# Patient Record
Sex: Female | Born: 1945
Health system: Southern US, Community
[De-identification: ages and names within clinical notes are randomized; demographics above are authoritative.]

## PROBLEM LIST (undated history)

## (undated) DIAGNOSIS — M1990 Unspecified osteoarthritis, unspecified site: Secondary | ICD-10-CM

## (undated) DIAGNOSIS — Q211 Atrial septal defect, unspecified: Secondary | ICD-10-CM

## (undated) DIAGNOSIS — T8859XA Other complications of anesthesia, initial encounter: Secondary | ICD-10-CM

## (undated) DIAGNOSIS — E039 Hypothyroidism, unspecified: Secondary | ICD-10-CM

## (undated) DIAGNOSIS — T4145XA Adverse effect of unspecified anesthetic, initial encounter: Secondary | ICD-10-CM

## (undated) DIAGNOSIS — C50919 Malignant neoplasm of unspecified site of unspecified female breast: Secondary | ICD-10-CM

## (undated) DIAGNOSIS — Z853 Personal history of malignant neoplasm of breast: Secondary | ICD-10-CM

## (undated) HISTORY — DX: Atrial septal defect, unspecified: Q21.10

## (undated) HISTORY — PX: ABDOMINAL HYSTERECTOMY: SHX81

## (undated) HISTORY — DX: Malignant neoplasm of unspecified site of unspecified female breast: C50.919

## (undated) HISTORY — DX: Atrial septal defect: Q21.1

## (undated) HISTORY — PX: MASTECTOMY: SHX3

## (undated) HISTORY — DX: Hypothyroidism, unspecified: E03.9

---
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
## 2000-03-12 ENCOUNTER — Ambulatory Visit: Admission: RE | Admit: 2000-03-12 | Discharge: 2000-03-12 | Payer: Self-pay | Admitting: Gynecology

## 2000-04-16 ENCOUNTER — Ambulatory Visit: Admission: RE | Admit: 2000-04-16 | Discharge: 2000-04-16 | Payer: Self-pay | Admitting: Gynecology

## 2015-02-08 DIAGNOSIS — N941 Unspecified dyspareunia: Secondary | ICD-10-CM | POA: Diagnosis not present

## 2015-02-08 DIAGNOSIS — N952 Postmenopausal atrophic vaginitis: Secondary | ICD-10-CM | POA: Diagnosis not present

## 2015-02-14 DIAGNOSIS — J189 Pneumonia, unspecified organism: Secondary | ICD-10-CM | POA: Diagnosis not present

## 2015-02-14 DIAGNOSIS — J9801 Acute bronchospasm: Secondary | ICD-10-CM | POA: Diagnosis not present

## 2015-02-14 DIAGNOSIS — Z6822 Body mass index (BMI) 22.0-22.9, adult: Secondary | ICD-10-CM | POA: Diagnosis not present

## 2015-03-23 DIAGNOSIS — Z6822 Body mass index (BMI) 22.0-22.9, adult: Secondary | ICD-10-CM | POA: Diagnosis not present

## 2015-03-23 DIAGNOSIS — M25569 Pain in unspecified knee: Secondary | ICD-10-CM | POA: Diagnosis not present

## 2015-09-18 DIAGNOSIS — G629 Polyneuropathy, unspecified: Secondary | ICD-10-CM | POA: Diagnosis not present

## 2015-09-18 DIAGNOSIS — K5900 Constipation, unspecified: Secondary | ICD-10-CM | POA: Diagnosis not present

## 2015-09-18 DIAGNOSIS — M545 Low back pain: Secondary | ICD-10-CM | POA: Diagnosis not present

## 2015-09-18 DIAGNOSIS — E038 Other specified hypothyroidism: Secondary | ICD-10-CM | POA: Diagnosis not present

## 2015-09-18 DIAGNOSIS — M5126 Other intervertebral disc displacement, lumbar region: Secondary | ICD-10-CM | POA: Diagnosis not present

## 2015-12-12 DIAGNOSIS — R252 Cramp and spasm: Secondary | ICD-10-CM | POA: Diagnosis not present

## 2015-12-12 DIAGNOSIS — Z1231 Encounter for screening mammogram for malignant neoplasm of breast: Secondary | ICD-10-CM | POA: Diagnosis not present

## 2015-12-12 DIAGNOSIS — J301 Allergic rhinitis due to pollen: Secondary | ICD-10-CM | POA: Diagnosis not present

## 2015-12-12 DIAGNOSIS — Z6822 Body mass index (BMI) 22.0-22.9, adult: Secondary | ICD-10-CM | POA: Diagnosis not present

## 2015-12-12 DIAGNOSIS — Z23 Encounter for immunization: Secondary | ICD-10-CM | POA: Diagnosis not present

## 2015-12-12 DIAGNOSIS — Z9181 History of falling: Secondary | ICD-10-CM | POA: Diagnosis not present

## 2015-12-12 DIAGNOSIS — E038 Other specified hypothyroidism: Secondary | ICD-10-CM | POA: Diagnosis not present

## 2015-12-12 DIAGNOSIS — Z1389 Encounter for screening for other disorder: Secondary | ICD-10-CM | POA: Diagnosis not present

## 2015-12-12 DIAGNOSIS — E785 Hyperlipidemia, unspecified: Secondary | ICD-10-CM | POA: Diagnosis not present

## 2015-12-12 DIAGNOSIS — Z79899 Other long term (current) drug therapy: Secondary | ICD-10-CM | POA: Diagnosis not present

## 2015-12-12 DIAGNOSIS — M818 Other osteoporosis without current pathological fracture: Secondary | ICD-10-CM | POA: Diagnosis not present

## 2015-12-12 DIAGNOSIS — K5900 Constipation, unspecified: Secondary | ICD-10-CM | POA: Diagnosis not present

## 2015-12-12 DIAGNOSIS — Z0000 Encounter for general adult medical examination without abnormal findings: Secondary | ICD-10-CM | POA: Diagnosis not present

## 2015-12-12 DIAGNOSIS — M545 Low back pain: Secondary | ICD-10-CM | POA: Diagnosis not present

## 2016-01-05 DIAGNOSIS — Z1231 Encounter for screening mammogram for malignant neoplasm of breast: Secondary | ICD-10-CM | POA: Diagnosis not present

## 2016-02-02 DIAGNOSIS — Z6822 Body mass index (BMI) 22.0-22.9, adult: Secondary | ICD-10-CM | POA: Diagnosis not present

## 2016-02-02 DIAGNOSIS — R238 Other skin changes: Secondary | ICD-10-CM | POA: Diagnosis not present

## 2016-02-02 DIAGNOSIS — N764 Abscess of vulva: Secondary | ICD-10-CM | POA: Diagnosis not present

## 2016-02-02 DIAGNOSIS — R300 Dysuria: Secondary | ICD-10-CM | POA: Diagnosis not present

## 2016-04-24 DIAGNOSIS — M5126 Other intervertebral disc displacement, lumbar region: Secondary | ICD-10-CM | POA: Diagnosis not present

## 2016-04-24 DIAGNOSIS — K5900 Constipation, unspecified: Secondary | ICD-10-CM | POA: Diagnosis not present

## 2016-04-24 DIAGNOSIS — Z6822 Body mass index (BMI) 22.0-22.9, adult: Secondary | ICD-10-CM | POA: Diagnosis not present

## 2016-07-05 DIAGNOSIS — H5203 Hypermetropia, bilateral: Secondary | ICD-10-CM | POA: Diagnosis not present

## 2016-07-05 DIAGNOSIS — H52223 Regular astigmatism, bilateral: Secondary | ICD-10-CM | POA: Diagnosis not present

## 2016-07-05 DIAGNOSIS — H524 Presbyopia: Secondary | ICD-10-CM | POA: Diagnosis not present

## 2016-09-13 DIAGNOSIS — M5431 Sciatica, right side: Secondary | ICD-10-CM | POA: Diagnosis not present

## 2016-09-13 DIAGNOSIS — S39012A Strain of muscle, fascia and tendon of lower back, initial encounter: Secondary | ICD-10-CM | POA: Diagnosis not present

## 2016-09-14 DIAGNOSIS — M5416 Radiculopathy, lumbar region: Secondary | ICD-10-CM | POA: Diagnosis not present

## 2016-09-14 DIAGNOSIS — M5430 Sciatica, unspecified side: Secondary | ICD-10-CM | POA: Diagnosis not present

## 2016-09-14 DIAGNOSIS — M5116 Intervertebral disc disorders with radiculopathy, lumbar region: Secondary | ICD-10-CM | POA: Diagnosis not present

## 2016-09-14 DIAGNOSIS — M5431 Sciatica, right side: Secondary | ICD-10-CM | POA: Diagnosis not present

## 2016-09-19 DIAGNOSIS — E063 Autoimmune thyroiditis: Secondary | ICD-10-CM | POA: Diagnosis not present

## 2016-09-19 DIAGNOSIS — K5900 Constipation, unspecified: Secondary | ICD-10-CM | POA: Diagnosis not present

## 2016-09-19 DIAGNOSIS — N390 Urinary tract infection, site not specified: Secondary | ICD-10-CM | POA: Diagnosis not present

## 2016-09-19 DIAGNOSIS — Z6822 Body mass index (BMI) 22.0-22.9, adult: Secondary | ICD-10-CM | POA: Diagnosis not present

## 2016-09-19 DIAGNOSIS — M545 Low back pain: Secondary | ICD-10-CM | POA: Diagnosis not present

## 2016-12-17 DIAGNOSIS — Z6821 Body mass index (BMI) 21.0-21.9, adult: Secondary | ICD-10-CM | POA: Diagnosis not present

## 2016-12-17 DIAGNOSIS — Z23 Encounter for immunization: Secondary | ICD-10-CM | POA: Diagnosis not present

## 2016-12-17 DIAGNOSIS — Z0000 Encounter for general adult medical examination without abnormal findings: Secondary | ICD-10-CM | POA: Diagnosis not present

## 2016-12-17 DIAGNOSIS — M5126 Other intervertebral disc displacement, lumbar region: Secondary | ICD-10-CM | POA: Diagnosis not present

## 2016-12-17 DIAGNOSIS — E785 Hyperlipidemia, unspecified: Secondary | ICD-10-CM | POA: Diagnosis not present

## 2016-12-17 DIAGNOSIS — I872 Venous insufficiency (chronic) (peripheral): Secondary | ICD-10-CM | POA: Diagnosis not present

## 2016-12-17 DIAGNOSIS — M7989 Other specified soft tissue disorders: Secondary | ICD-10-CM | POA: Diagnosis not present

## 2016-12-17 DIAGNOSIS — E063 Autoimmune thyroiditis: Secondary | ICD-10-CM | POA: Diagnosis not present

## 2016-12-17 DIAGNOSIS — J301 Allergic rhinitis due to pollen: Secondary | ICD-10-CM | POA: Diagnosis not present

## 2016-12-17 DIAGNOSIS — M25569 Pain in unspecified knee: Secondary | ICD-10-CM | POA: Diagnosis not present

## 2016-12-17 DIAGNOSIS — M818 Other osteoporosis without current pathological fracture: Secondary | ICD-10-CM | POA: Diagnosis not present

## 2016-12-17 DIAGNOSIS — Z1231 Encounter for screening mammogram for malignant neoplasm of breast: Secondary | ICD-10-CM | POA: Diagnosis not present

## 2016-12-17 DIAGNOSIS — Z79899 Other long term (current) drug therapy: Secondary | ICD-10-CM | POA: Diagnosis not present

## 2016-12-17 DIAGNOSIS — M545 Low back pain: Secondary | ICD-10-CM | POA: Diagnosis not present

## 2016-12-19 DIAGNOSIS — M79661 Pain in right lower leg: Secondary | ICD-10-CM | POA: Diagnosis not present

## 2016-12-19 DIAGNOSIS — M79662 Pain in left lower leg: Secondary | ICD-10-CM | POA: Diagnosis not present

## 2016-12-19 DIAGNOSIS — R7989 Other specified abnormal findings of blood chemistry: Secondary | ICD-10-CM | POA: Diagnosis not present

## 2016-12-19 DIAGNOSIS — M7989 Other specified soft tissue disorders: Secondary | ICD-10-CM | POA: Diagnosis not present

## 2016-12-25 DIAGNOSIS — Z6821 Body mass index (BMI) 21.0-21.9, adult: Secondary | ICD-10-CM | POA: Diagnosis not present

## 2016-12-25 DIAGNOSIS — N390 Urinary tract infection, site not specified: Secondary | ICD-10-CM | POA: Diagnosis not present

## 2016-12-25 DIAGNOSIS — M545 Low back pain: Secondary | ICD-10-CM | POA: Diagnosis not present

## 2017-01-07 DIAGNOSIS — Z1231 Encounter for screening mammogram for malignant neoplasm of breast: Secondary | ICD-10-CM | POA: Diagnosis not present

## 2017-05-16 DIAGNOSIS — E063 Autoimmune thyroiditis: Secondary | ICD-10-CM | POA: Diagnosis not present

## 2017-05-16 DIAGNOSIS — M545 Low back pain: Secondary | ICD-10-CM | POA: Diagnosis not present

## 2017-05-16 DIAGNOSIS — Z6821 Body mass index (BMI) 21.0-21.9, adult: Secondary | ICD-10-CM | POA: Diagnosis not present

## 2017-06-05 DIAGNOSIS — M5430 Sciatica, unspecified side: Secondary | ICD-10-CM | POA: Diagnosis not present

## 2017-09-30 DIAGNOSIS — E063 Autoimmune thyroiditis: Secondary | ICD-10-CM | POA: Diagnosis not present

## 2017-09-30 DIAGNOSIS — E785 Hyperlipidemia, unspecified: Secondary | ICD-10-CM | POA: Diagnosis not present

## 2017-09-30 DIAGNOSIS — K5900 Constipation, unspecified: Secondary | ICD-10-CM | POA: Diagnosis not present

## 2017-09-30 DIAGNOSIS — Z6821 Body mass index (BMI) 21.0-21.9, adult: Secondary | ICD-10-CM | POA: Diagnosis not present

## 2017-09-30 DIAGNOSIS — M5431 Sciatica, right side: Secondary | ICD-10-CM | POA: Diagnosis not present

## 2017-11-10 DIAGNOSIS — Z6821 Body mass index (BMI) 21.0-21.9, adult: Secondary | ICD-10-CM | POA: Diagnosis not present

## 2017-11-10 DIAGNOSIS — M62830 Muscle spasm of back: Secondary | ICD-10-CM | POA: Diagnosis not present

## 2017-11-10 DIAGNOSIS — M545 Low back pain: Secondary | ICD-10-CM | POA: Diagnosis not present

## 2017-11-10 DIAGNOSIS — K5900 Constipation, unspecified: Secondary | ICD-10-CM | POA: Diagnosis not present

## 2018-01-20 DIAGNOSIS — E063 Autoimmune thyroiditis: Secondary | ICD-10-CM | POA: Diagnosis not present

## 2018-01-20 DIAGNOSIS — G2581 Restless legs syndrome: Secondary | ICD-10-CM | POA: Diagnosis not present

## 2018-01-20 DIAGNOSIS — Z1231 Encounter for screening mammogram for malignant neoplasm of breast: Secondary | ICD-10-CM | POA: Diagnosis not present

## 2018-01-20 DIAGNOSIS — Z6822 Body mass index (BMI) 22.0-22.9, adult: Secondary | ICD-10-CM | POA: Diagnosis not present

## 2018-01-20 DIAGNOSIS — J301 Allergic rhinitis due to pollen: Secondary | ICD-10-CM | POA: Diagnosis not present

## 2018-01-20 DIAGNOSIS — Z23 Encounter for immunization: Secondary | ICD-10-CM | POA: Diagnosis not present

## 2018-01-20 DIAGNOSIS — Z79899 Other long term (current) drug therapy: Secondary | ICD-10-CM | POA: Diagnosis not present

## 2018-01-20 DIAGNOSIS — Z0000 Encounter for general adult medical examination without abnormal findings: Secondary | ICD-10-CM | POA: Diagnosis not present

## 2018-01-20 DIAGNOSIS — E785 Hyperlipidemia, unspecified: Secondary | ICD-10-CM | POA: Diagnosis not present

## 2018-01-20 DIAGNOSIS — M545 Low back pain: Secondary | ICD-10-CM | POA: Diagnosis not present

## 2018-01-26 DIAGNOSIS — M160 Bilateral primary osteoarthritis of hip: Secondary | ICD-10-CM | POA: Diagnosis not present

## 2018-01-26 DIAGNOSIS — M5442 Lumbago with sciatica, left side: Secondary | ICD-10-CM | POA: Diagnosis not present

## 2018-01-26 DIAGNOSIS — Z1231 Encounter for screening mammogram for malignant neoplasm of breast: Secondary | ICD-10-CM | POA: Diagnosis not present

## 2018-01-26 DIAGNOSIS — M545 Low back pain: Secondary | ICD-10-CM | POA: Diagnosis not present

## 2018-01-26 DIAGNOSIS — M5441 Lumbago with sciatica, right side: Secondary | ICD-10-CM | POA: Diagnosis not present

## 2018-01-28 DIAGNOSIS — M1610 Unilateral primary osteoarthritis, unspecified hip: Secondary | ICD-10-CM | POA: Diagnosis not present

## 2018-01-28 DIAGNOSIS — M160 Bilateral primary osteoarthritis of hip: Secondary | ICD-10-CM | POA: Diagnosis not present

## 2018-02-06 DIAGNOSIS — M1611 Unilateral primary osteoarthritis, right hip: Secondary | ICD-10-CM | POA: Diagnosis not present

## 2018-03-11 DIAGNOSIS — M1611 Unilateral primary osteoarthritis, right hip: Secondary | ICD-10-CM | POA: Diagnosis not present

## 2018-06-19 DIAGNOSIS — E785 Hyperlipidemia, unspecified: Secondary | ICD-10-CM | POA: Diagnosis not present

## 2018-06-19 DIAGNOSIS — M7989 Other specified soft tissue disorders: Secondary | ICD-10-CM | POA: Diagnosis not present

## 2018-06-19 DIAGNOSIS — Z6822 Body mass index (BMI) 22.0-22.9, adult: Secondary | ICD-10-CM | POA: Diagnosis not present

## 2018-06-19 DIAGNOSIS — E063 Autoimmune thyroiditis: Secondary | ICD-10-CM | POA: Diagnosis not present

## 2018-06-22 DIAGNOSIS — R7989 Other specified abnormal findings of blood chemistry: Secondary | ICD-10-CM | POA: Diagnosis not present

## 2018-06-22 DIAGNOSIS — R2242 Localized swelling, mass and lump, left lower limb: Secondary | ICD-10-CM | POA: Diagnosis not present

## 2018-06-22 DIAGNOSIS — R2241 Localized swelling, mass and lump, right lower limb: Secondary | ICD-10-CM | POA: Diagnosis not present

## 2018-06-22 DIAGNOSIS — M7989 Other specified soft tissue disorders: Secondary | ICD-10-CM | POA: Diagnosis not present

## 2018-07-28 DIAGNOSIS — L03116 Cellulitis of left lower limb: Secondary | ICD-10-CM | POA: Diagnosis not present

## 2018-07-28 DIAGNOSIS — L03115 Cellulitis of right lower limb: Secondary | ICD-10-CM | POA: Diagnosis not present

## 2018-07-28 DIAGNOSIS — Z6822 Body mass index (BMI) 22.0-22.9, adult: Secondary | ICD-10-CM | POA: Diagnosis not present

## 2018-07-28 DIAGNOSIS — I872 Venous insufficiency (chronic) (peripheral): Secondary | ICD-10-CM | POA: Diagnosis not present

## 2018-08-20 DIAGNOSIS — Z6821 Body mass index (BMI) 21.0-21.9, adult: Secondary | ICD-10-CM | POA: Diagnosis not present

## 2018-08-20 DIAGNOSIS — R262 Difficulty in walking, not elsewhere classified: Secondary | ICD-10-CM | POA: Diagnosis not present

## 2018-08-20 DIAGNOSIS — M25551 Pain in right hip: Secondary | ICD-10-CM | POA: Diagnosis not present

## 2018-08-20 DIAGNOSIS — M25552 Pain in left hip: Secondary | ICD-10-CM | POA: Diagnosis not present

## 2018-08-20 DIAGNOSIS — M5416 Radiculopathy, lumbar region: Secondary | ICD-10-CM | POA: Diagnosis not present

## 2018-11-02 DIAGNOSIS — M62830 Muscle spasm of back: Secondary | ICD-10-CM | POA: Diagnosis not present

## 2018-11-02 DIAGNOSIS — K5900 Constipation, unspecified: Secondary | ICD-10-CM | POA: Diagnosis not present

## 2018-11-02 DIAGNOSIS — M545 Low back pain: Secondary | ICD-10-CM | POA: Diagnosis not present

## 2018-11-02 DIAGNOSIS — N390 Urinary tract infection, site not specified: Secondary | ICD-10-CM | POA: Diagnosis not present

## 2018-11-02 DIAGNOSIS — R300 Dysuria: Secondary | ICD-10-CM | POA: Diagnosis not present

## 2018-11-02 DIAGNOSIS — M5432 Sciatica, left side: Secondary | ICD-10-CM | POA: Diagnosis not present

## 2018-11-02 DIAGNOSIS — Z6822 Body mass index (BMI) 22.0-22.9, adult: Secondary | ICD-10-CM | POA: Diagnosis not present

## 2018-11-05 DIAGNOSIS — M48061 Spinal stenosis, lumbar region without neurogenic claudication: Secondary | ICD-10-CM | POA: Diagnosis not present

## 2018-11-05 DIAGNOSIS — M545 Low back pain: Secondary | ICD-10-CM | POA: Diagnosis not present

## 2018-11-18 DIAGNOSIS — Z23 Encounter for immunization: Secondary | ICD-10-CM | POA: Diagnosis not present

## 2018-11-25 DIAGNOSIS — R350 Frequency of micturition: Secondary | ICD-10-CM | POA: Diagnosis not present

## 2018-11-25 DIAGNOSIS — M62830 Muscle spasm of back: Secondary | ICD-10-CM | POA: Diagnosis not present

## 2018-11-25 DIAGNOSIS — E063 Autoimmune thyroiditis: Secondary | ICD-10-CM | POA: Diagnosis not present

## 2018-11-25 DIAGNOSIS — E785 Hyperlipidemia, unspecified: Secondary | ICD-10-CM | POA: Diagnosis not present

## 2018-11-25 DIAGNOSIS — G2581 Restless legs syndrome: Secondary | ICD-10-CM | POA: Diagnosis not present

## 2018-11-25 DIAGNOSIS — Z1331 Encounter for screening for depression: Secondary | ICD-10-CM | POA: Diagnosis not present

## 2018-11-25 DIAGNOSIS — Z6821 Body mass index (BMI) 21.0-21.9, adult: Secondary | ICD-10-CM | POA: Diagnosis not present

## 2018-11-25 DIAGNOSIS — E559 Vitamin D deficiency, unspecified: Secondary | ICD-10-CM | POA: Diagnosis not present

## 2018-11-25 DIAGNOSIS — Z9181 History of falling: Secondary | ICD-10-CM | POA: Diagnosis not present

## 2018-11-25 DIAGNOSIS — M545 Low back pain: Secondary | ICD-10-CM | POA: Diagnosis not present

## 2018-11-30 DIAGNOSIS — M5416 Radiculopathy, lumbar region: Secondary | ICD-10-CM | POA: Diagnosis not present

## 2018-11-30 DIAGNOSIS — M5136 Other intervertebral disc degeneration, lumbar region: Secondary | ICD-10-CM | POA: Diagnosis not present

## 2019-01-25 DIAGNOSIS — L03116 Cellulitis of left lower limb: Secondary | ICD-10-CM | POA: Diagnosis not present

## 2019-01-25 DIAGNOSIS — M7989 Other specified soft tissue disorders: Secondary | ICD-10-CM | POA: Diagnosis not present

## 2019-01-25 DIAGNOSIS — E559 Vitamin D deficiency, unspecified: Secondary | ICD-10-CM | POA: Diagnosis not present

## 2019-01-25 DIAGNOSIS — E063 Autoimmune thyroiditis: Secondary | ICD-10-CM | POA: Diagnosis not present

## 2019-01-25 DIAGNOSIS — L03115 Cellulitis of right lower limb: Secondary | ICD-10-CM | POA: Diagnosis not present

## 2019-01-25 DIAGNOSIS — Z6823 Body mass index (BMI) 23.0-23.9, adult: Secondary | ICD-10-CM | POA: Diagnosis not present

## 2019-01-25 DIAGNOSIS — I872 Venous insufficiency (chronic) (peripheral): Secondary | ICD-10-CM | POA: Diagnosis not present

## 2019-01-26 DIAGNOSIS — M7989 Other specified soft tissue disorders: Secondary | ICD-10-CM | POA: Diagnosis not present

## 2019-01-26 DIAGNOSIS — R600 Localized edema: Secondary | ICD-10-CM | POA: Diagnosis not present

## 2019-01-26 DIAGNOSIS — R7989 Other specified abnormal findings of blood chemistry: Secondary | ICD-10-CM | POA: Diagnosis not present

## 2019-02-13 DIAGNOSIS — R609 Edema, unspecified: Secondary | ICD-10-CM | POA: Diagnosis not present

## 2019-02-13 DIAGNOSIS — R600 Localized edema: Secondary | ICD-10-CM | POA: Diagnosis not present

## 2019-02-13 DIAGNOSIS — J9811 Atelectasis: Secondary | ICD-10-CM | POA: Diagnosis not present

## 2019-02-13 DIAGNOSIS — B9689 Other specified bacterial agents as the cause of diseases classified elsewhere: Secondary | ICD-10-CM | POA: Diagnosis not present

## 2019-02-13 DIAGNOSIS — N3000 Acute cystitis without hematuria: Secondary | ICD-10-CM | POA: Diagnosis not present

## 2019-02-25 DIAGNOSIS — E063 Autoimmune thyroiditis: Secondary | ICD-10-CM | POA: Diagnosis not present

## 2019-02-25 DIAGNOSIS — E559 Vitamin D deficiency, unspecified: Secondary | ICD-10-CM | POA: Diagnosis not present

## 2019-02-25 DIAGNOSIS — Z79899 Other long term (current) drug therapy: Secondary | ICD-10-CM | POA: Diagnosis not present

## 2019-02-25 DIAGNOSIS — Z6822 Body mass index (BMI) 22.0-22.9, adult: Secondary | ICD-10-CM | POA: Diagnosis not present

## 2019-02-25 DIAGNOSIS — J301 Allergic rhinitis due to pollen: Secondary | ICD-10-CM | POA: Diagnosis not present

## 2019-02-25 DIAGNOSIS — G2581 Restless legs syndrome: Secondary | ICD-10-CM | POA: Diagnosis not present

## 2019-02-25 DIAGNOSIS — R7989 Other specified abnormal findings of blood chemistry: Secondary | ICD-10-CM | POA: Diagnosis not present

## 2019-02-25 DIAGNOSIS — E785 Hyperlipidemia, unspecified: Secondary | ICD-10-CM | POA: Diagnosis not present

## 2019-02-25 DIAGNOSIS — M545 Low back pain: Secondary | ICD-10-CM | POA: Diagnosis not present

## 2019-02-25 DIAGNOSIS — K5900 Constipation, unspecified: Secondary | ICD-10-CM | POA: Diagnosis not present

## 2019-03-02 DIAGNOSIS — I370 Nonrheumatic pulmonary valve stenosis: Secondary | ICD-10-CM | POA: Diagnosis not present

## 2019-03-02 DIAGNOSIS — I361 Nonrheumatic tricuspid (valve) insufficiency: Secondary | ICD-10-CM | POA: Diagnosis not present

## 2019-03-02 DIAGNOSIS — I352 Nonrheumatic aortic (valve) stenosis with insufficiency: Secondary | ICD-10-CM | POA: Diagnosis not present

## 2019-03-02 DIAGNOSIS — R011 Cardiac murmur, unspecified: Secondary | ICD-10-CM | POA: Diagnosis not present

## 2019-03-15 ENCOUNTER — Other Ambulatory Visit: Payer: Self-pay

## 2019-03-15 ENCOUNTER — Ambulatory Visit (INDEPENDENT_AMBULATORY_CARE_PROVIDER_SITE_OTHER): Payer: PPO | Admitting: Cardiology

## 2019-03-15 ENCOUNTER — Encounter: Payer: Self-pay | Admitting: Cardiology

## 2019-03-15 VITALS — BP 124/78 | HR 89 | Ht 61.0 in | Wt 122.0 lb

## 2019-03-15 DIAGNOSIS — R600 Localized edema: Secondary | ICD-10-CM

## 2019-03-15 DIAGNOSIS — I350 Nonrheumatic aortic (valve) stenosis: Secondary | ICD-10-CM

## 2019-03-15 HISTORY — DX: Localized edema: R60.0

## 2019-03-15 HISTORY — DX: Nonrheumatic aortic (valve) stenosis: I35.0

## 2019-03-15 MED ORDER — POTASSIUM CHLORIDE CRYS ER 20 MEQ PO TBCR: EXTENDED_RELEASE_TABLET | ORAL | 1 refills | Status: DC

## 2019-03-15 MED ORDER — FUROSEMIDE 20 MG PO TABS: ORAL_TABLET | ORAL | 1 refills | Status: DC

## 2019-03-15 NOTE — Patient Instructions (Signed)
Medication Instructions:  Your physician has recommended you make the following change in your medication:   START: Furosemide 20 mg Take 1 tab on Tues,Thurs and Saturdays START: Potassium 20 meq Take 1 tab on Tues,Thurs and Saturdays  *If you need a refill on your cardiac medications before your next appointment, please call your pharmacy*  Lab Work: Your physician recommends that you return for lab work in: TODAY BMP,Magnesium If you have labs (blood work) drawn today and your tests are completely normal, you will receive your results only by: Marland Kitchen MyChart Message (if you have MyChart) OR . A paper copy in the mail If you have any lab test that is abnormal or we need to change your treatment, we will call you to review the results.  Testing/Procedures: None   Follow-Up: At [facility], you and your health needs are our priority.  As part of our continuing mission to provide you with exceptional heart care, we have created designated Provider Care Teams.  These Care Teams include your primary Cardiologist (physician) and Advanced Practice Providers (APPs -  Physician Assistants and Nurse Practitioners) who all work together to provide you with the care you need, when you need it.  Your next appointment:   1 month(s)  The format for your next appointment:   In Person  Provider:   Berniece Salines, DO  Other Instructions You are being referred to Dr Burt Knack for your aortic stenosis.They will contact you with an appointment date and time.

## 2019-03-15 NOTE — Progress Notes (Signed)
Cardiology Office Note:    Date:  03/15/2019   ID:  Denise Bailey, DOB [birth year], MRN [id number]  PCP:  Penelope Coop, FNP  Cardiologist:  Berniece Salines, DO  Electrophysiologist:  None   Referring MD: Penelope Coop, FNP   The patient was referred by her PCP due to aortic stenosis  History of Present Illness:    Denise Bailey is a 74 y.o. female with a hx of recently diagnosed severe aortic stenosis, hypothyroidism, breast cancer status post mastectomy.  Patient was referred by her primary care provider due to severe aortic stenosis.  Today she tells me that her activities are limited by her bilateral leg edema and leg pain.  She denies any chest pain, shortness of breath, lightheadedness or dizziness.    Past Medical History:  Diagnosis Date  . Breast cancer ([place])   . Hypothyroidism     Past Surgical History:  Procedure Laterality Date  . MASTECTOMY      Current Medications: Current Meds  Medication Sig  . hydrochlorothiazide (HYDRODIURIL) 25 MG tablet Take 25 mg by mouth daily.  Marland Kitchen levothyroxine (SYNTHROID) 88 MCG tablet Take 88 mcg by mouth daily.  . traMADol (ULTRAM) 50 MG tablet Take 100 mg by mouth 3 (three) times daily as needed.     Allergies:   Patient has no known allergies.   Social History   Socioeconomic History  . Marital status: Married    Spouse name: Not on file  . Number of children: Not on file  . Years of education: Not on file  . Highest education level: Not on file  Occupational History  . Not on file  Tobacco Use  . Smoking status: Never Smoker  Substance and Sexual Activity  . Alcohol use: Not on file  . Drug use: Not on file  . Sexual activity: Not on file  Other Topics Concern  . Not on file  Social History Narrative  . Not on file   Social Determinants of Health   Financial Resource Strain:   . Difficulty of Paying Living Expenses: Not on file  Food Insecurity:   . Worried About Charity fundraiser in the Last  Year: Not on file  . Ran Out of Food in the Last Year: Not on file  Transportation Needs:   . Lack of Transportation (Medical): Not on file  . Lack of Transportation (Non-Medical): Not on file  Physical Activity:   . Days of Exercise per Week: Not on file  . Minutes of Exercise per Session: Not on file  Stress:   . Feeling of Stress : Not on file  Social Connections:   . Frequency of Communication with Friends and Family: Not on file  . Frequency of Social Gatherings with Friends and Family: Not on file  . Attends Religious Services: Not on file  . Active Member of Clubs or Organizations: Not on file  . Attends Archivist Meetings: Not on file  . Marital Status: Not on file     Family History: The patient's family history is not on file.  ROS:   Review of Systems  Constitution: Negative for decreased appetite, fever and weight gain.  HENT: Negative for congestion, ear discharge, hoarse voice and sore throat.   Eyes: Negative for discharge, redness, vision loss in right eye and visual halos.  Cardiovascular: Negative for chest pain, dyspnea on exertion, leg swelling, orthopnea and palpitations.  Respiratory: Negative for cough, hemoptysis, shortness of breath and snoring.  Endocrine: Negative for heat intolerance and polyphagia.  Hematologic/Lymphatic: Negative for bleeding problem. Does not bruise/bleed easily.  Skin: Negative for flushing, nail changes, rash and suspicious lesions.  Musculoskeletal: Negative for arthritis, joint pain, muscle cramps, myalgias, neck pain and stiffness.  Gastrointestinal: Negative for abdominal pain, bowel incontinence, diarrhea and excessive appetite.  Genitourinary: Negative for decreased libido, genital sores and incomplete emptying.  Neurological: Negative for brief paralysis, focal weakness, headaches and loss of balance.  Psychiatric/Behavioral: Negative for altered mental status, depression and suicidal ideas.    Allergic/Immunologic: Negative for HIV exposure and persistent infections.    EKGs/Labs/Other Studies Reviewed:    The following studies were reviewed today:   EKG:  The ekg ordered today demonstrates normal sinus rhythm, heart rate 89 bpm.   Echocardiogram from [facility] showed normal left ventricle systolic function 60 to [id number].  Diastolic function with impaired relaxation.  Right ventricle was normal in size and function.  Left atrium is normal.  Right atrium is normal in size.  Interatrial septum intact.  Aortic valve with severe aortic stenosis (mean gradient 48, aortic valve area by VTI 0.26 cm, peak velocity 4.12 m/s).  Mild aortic regurgitation.  Mild to moderate tricuspid vegetation present.  There is trace mitral regurgitation.  RVSP 42 mmHg.  Mild pulmonic stenosis.  The aortic root, ascending aorta and aortic arch appears to be normal size.  Recent Labs: No results found for requested labs within last 8760 hours.  Recent Lipid Panel No results found for: CHOL, TRIG, HDL, CHOLHDL, VLDL, LDLCALC, LDLDIRECT  Physical Exam:    VS:  BP 124/78   Pulse 89   Ht 5\' 1"  (1.549 m)   Wt 122 lb (55.3 kg)   BMI 23.05 kg/m     Wt Readings from Last 3 Encounters:  03/15/19 122 lb (55.3 kg)     GEN: Well nourished, well developed in no acute distress HEENT: Normal NECK: No JVD; No carotid bruits LYMPHATICS: No lymphadenopathy CARDIAC: S1S2 noted,RRR, 2/6 mid-to-late ejection systolic murmurs, rubs, gallops RESPIRATORY:  Clear to auscultation without rales, wheezing or rhonchi  ABDOMEN: Soft, non-tender, non-distended, +bowel sounds, no guarding. EXTREMITIES: Bilateral +1 leg edema, No cyanosis, no clubbing MUSCULOSKELETAL:  No deformity  SKIN: Warm and dry NEUROLOGIC:  Alert and oriented x 3, non-focal PSYCHIATRIC:  Normal affect, good insight  ASSESSMENT:    1. Severe aortic stenosis   2. Bilateral leg edema    PLAN:    Physical exam does show evidence of severe  aortic stenosis with bilateral leg edema.  The patient denies any chest pain, lightheadedness, dizziness although I do feel that she is at limited activity therefore cannot elicit symptoms.  At this time I would like to refer the patient to our structural clinic to be evaluated for potential TAVR.  In addition with the bilateral leg edema I am going to add Lasix 20 mg Tuesday Thursday and Saturdays along with potassium supplement to her daily regimen.  Blood work will be performed today for BMP and mag.   The patient is in agreement with the above plan. The patient left the office in stable condition.  The patient will follow up in 1 month or sooner if needed.   Medication Adjustments/Labs and Tests Ordered: Current medicines are reviewed at length with the patient today.  Concerns regarding medicines are outlined above.  Orders Placed This Encounter  Procedures  . Basic Metabolic Panel (BMET)  . Magnesium  . Ambulatory referral to Structural Heart/Valve Clinic (only at  [facility])  . EKG 12-Lead   Meds ordered this encounter  Medications  . furosemide (LASIX) 20 MG tablet    Sig: Take 1 tab (20 mg) on Tues,Thurs, and Saturdays    Dispense:  45 tablet    Refill:  1  . potassium chloride SA (KLOR-CON M20) 20 MEQ tablet    Sig: Take 1 tab (20 meq) on Tues, Thurs, and Saturday    Dispense:  45 tablet    Refill:  1    There are no Patient Instructions on file for this visit.   Adopting a Healthy Lifestyle.  Know what a healthy weight is for you (roughly BMI <25) and aim to maintain this   Aim for 7+ servings of fruits and vegetables daily   65-80+ fluid ounces of water or unsweet tea for healthy kidneys   Limit to max 1 drink of alcohol per day; avoid smoking/tobacco   Limit animal fats in diet for cholesterol and heart health - choose grass fed whenever available   Avoid highly processed foods, and foods high in saturated/trans fats   Aim for low stress - take time to unwind  and care for your mental health   Aim for 150 min of moderate intensity exercise weekly for heart health, and weights twice weekly for bone health   Aim for 7-9 hours of sleep daily   When it comes to diets, agreement about the perfect plan isnt easy to find, even among the experts. Experts at the [employer] developed an idea known as the Healthy Eating Plate. Just imagine a plate divided into logical, healthy portions.   The emphasis is on diet quality:   Load up on vegetables and fruits - one-half of your plate: Aim for color and variety, and remember that potatoes dont count.   Go for whole grains - one-quarter of your plate: Whole wheat, barley, wheat berries, quinoa, oats, brown rice, and foods made with them. If you want pasta, go with whole wheat pasta.   Protein power - one-quarter of your plate: Fish, chicken, beans, and nuts are all healthy, versatile protein sources. Limit red meat.   The diet, however, does go beyond the plate, offering a few other suggestions.   Use healthy plant oils, such as olive, canola, soy, corn, sunflower and peanut. Check the labels, and avoid partially hydrogenated oil, which have unhealthy trans fats.   If youre thirsty, drink water. Coffee and tea are good in moderation, but skip sugary drinks and limit milk and dairy products to one or two daily servings.   The type of carbohydrate in the diet is more important than the amount. Some sources of carbohydrates, such as vegetables, fruits, whole grains, and beans-are healthier than others.   Finally, stay active  Signed, Berniece Salines, DO  03/15/2019 2:26 PM    [facility]

## 2019-03-16 LAB — BASIC METABOLIC PANEL
BUN/Creatinine Ratio: 27 (ref 12–28)
BUN: 20 mg/dL (ref 8–27)
CO2: 25 mmol/L (ref 20–29)
Calcium: 9.2 mg/dL (ref 8.7–10.3)
Chloride: 100 mmol/L (ref 96–106)
Creatinine, Ser: 0.73 mg/dL (ref 0.57–1.00)
GFR calc Af Amer: 94 mL/min/{1.73_m2} (ref >59)
GFR calc non Af Amer: 81 mL/min/{1.73_m2} (ref >59)
Glucose: 81 mg/dL (ref 65–99)
Potassium: 4.3 mmol/L (ref 3.5–5.2)
Sodium: 141 mmol/L (ref 134–144)

## 2019-03-16 LAB — MAGNESIUM: Magnesium: 2.1 mg/dL (ref 1.6–2.3)

## 2019-03-25 ENCOUNTER — Ambulatory Visit
Admission: RE | Admit: 2019-03-25 | Discharge: 2019-03-25 | Disposition: A | Discharge status: Home / Self Care | Payer: Self-pay | Source: Ambulatory Visit | Attending: Cardiovascular Disease | Admitting: Cardiovascular Disease

## 2019-03-25 ENCOUNTER — Other Ambulatory Visit: Payer: Self-pay

## 2019-03-25 DIAGNOSIS — I350 Nonrheumatic aortic (valve) stenosis: Secondary | ICD-10-CM

## 2019-03-29 ENCOUNTER — Ambulatory Visit (INDEPENDENT_AMBULATORY_CARE_PROVIDER_SITE_OTHER): Payer: PPO | Admitting: Cardiovascular Disease

## 2019-03-29 ENCOUNTER — Encounter: Payer: Self-pay | Admitting: Cardiovascular Disease

## 2019-03-29 ENCOUNTER — Other Ambulatory Visit: Payer: Self-pay

## 2019-03-29 VITALS — BP 134/74 | HR 88 | Ht 61.0 in | Wt 120.0 lb

## 2019-03-29 DIAGNOSIS — R600 Localized edema: Secondary | ICD-10-CM | POA: Diagnosis not present

## 2019-03-29 DIAGNOSIS — I350 Nonrheumatic aortic (valve) stenosis: Secondary | ICD-10-CM

## 2019-03-29 DIAGNOSIS — M79605 Pain in left leg: Secondary | ICD-10-CM

## 2019-03-29 DIAGNOSIS — M79604 Pain in right leg: Secondary | ICD-10-CM

## 2019-03-29 NOTE — Patient Instructions (Addendum)
Medication Instructions:  Your provider recommends that you continue on your current medications as directed. Please refer to the Current Medication list given to you today.    Labs: BNP  Follow-Up: You have been referred to VASCULAR SURGERY with an ultrasound of your legs before the visit.  We will call you to arrange your 6 month echo and office visit with Dr. Burt Knack.

## 2019-03-29 NOTE — Progress Notes (Signed)
[facility]   MULTIDISCIPLINARY HEART VALVE TEAM  Date:  03/30/2019   ID:  Denise Bailey, DOB Jun 07, 1945, MRN [id number]  PCP:  Penelope Coop, FNP   Chief Complaint  Patient presents with  . Leg Pain     HISTORY OF PRESENT ILLNESS: Denise Bailey is a 74 y.o. female who presents for evaluation of severe aortic stenosis, referred by Dr Harriet Masson.  The patient was recently referred to Dr. Harriet Masson after being diagnosed with severe aortic stenosis. She has never been told of a heart murmur prior to her recent diagnosis. She has no history of rheumatic fever. Her medical history is significant for remote breast cancer treated with mastectomy. She has no history of cardiac problems and specifically denies any history of CAD, heart attack, or arrhythmia. She denies chest pain, chest pressure, orthopnea, PND, lightheadedness, syncope, or heart palpitations. Her biggest complaint is leg swelling and pain. She has developed bilateral leg swelling over the past 3-4 months. She has leg pain with ambulation, low back and hip pain. She hasn't been able to get compression stockings on and even has a difficult time putting on regular socks and shoes.   The patient has had regular dental care every 6 months and reports no problems.  Past Medical History:  Diagnosis Date  . Breast cancer ([place])   . Hypothyroidism     Current Outpatient Medications  Medication Sig Dispense Refill  . furosemide (LASIX) 20 MG tablet Take 1 tab (20 mg) on Tues,Thurs, and Saturdays 45 tablet 1  . hydrochlorothiazide (HYDRODIURIL) 25 MG tablet Take 25 mg by mouth daily.    Marland Kitchen levothyroxine (SYNTHROID) 88 MCG tablet Take 88 mcg by mouth daily.    . potassium chloride SA (KLOR-CON M20) 20 MEQ tablet Take 1 tab (20 meq) on Tues, Thurs, and Saturday 45 tablet 1  . traMADol (ULTRAM) 50 MG tablet Take 100 mg by mouth 3 (three) times daily as needed.     No current facility-administered medications for this visit.      ALLERGIES:   Patient has no known allergies.   SOCIAL HISTORY:  The patient  reports that she has never smoked. She does not have any smokeless tobacco history on file.   FAMILY HISTORY:  The patient's family hx is negative for valvular heart disease  REVIEW OF SYSTEMS:  All other systems are reviewed and negative.   PHYSICAL EXAM: VS:  BP 134/74   Pulse 88   Ht 5\' 1"  (1.549 m)   Wt 120 lb (54.4 kg)   SpO2 97%   BMI 22.67 kg/m  , BMI Body mass index is 22.67 kg/m. GEN: Well nourished, well developed, in no acute distress HEENT: normal Neck: No JVD. carotids 2+ with bilateral bruits Cardiac: The heart is RRR with a grade 3/6 harsh mid peaking murmur with preserved A2.. 2+ bilateral pretibial edema with prominent superficial veins Respiratory:  clear to auscultation bilaterally GI: soft, nontender, nondistended, + BS MS: no deformity or atrophy Skin: warm and dry, no rash Neuro:  Strength and sensation are intact Psych: euthymic mood, full affect   RECENT LABS: 03/15/2019: BUN 20; Creatinine, Ser 0.73; Magnesium 2.1; Potassium 4.3; Sodium 141 03/29/2019: NT-Pro BNP 737  No results found for requested labs within last 8760 hours.   Estimated Creatinine Clearance: 45.8 mL/min (by C-G formula based on SCr of 0.73 mg/dL).   Wt Readings from Last 3 Encounters:  03/29/19 120 lb (54.4 kg)  03/15/19 122 lb (55.3  kg)     CARDIAC STUDIES:  Echo: images and report reviewed.   ASSESSMENT AND PLAN: 1.  74 yo woman with severe, stage C1 aortic stenosis.   I have reviewed the natural history of aortic stenosis with the patient and their family members who are present today. We have discussed the limitations of medical therapy and the poor prognosis associated with symptomatic aortic stenosis. We have reviewed potential treatment options, including palliative medical therapy, conventional surgical aortic valve replacement, and transcatheter aortic valve replacement. We discussed  treatment options in the context of the patient's specific comorbid medical conditions.    I have personally reviewed her echo images from [facility] which demonstrate normal LV systolic function, with moderate-severe calcification and restriction of the aortic valve leaflets. The aortic valve is difficult to visualize secondary to poor acoustic windows. Peak and mean gradients are 68 and 48 mmHg, respectively. The valve area calculates very small at 0.33 square cm but I don't think this is accurate as her LVOT is measured at 1.7 cm.   I spoke to the patient at length today about diagnostic/treatment approaches. She strongly desires continued observation/close follow-up. Her sister is ill and she does not expect her to live much longer. She does not want to begin testing that would be required to work her up for TAVR at this time. She understands that she will need to keep an eye out for symptoms of shortness of breath, chest pain, or lightheadedness. I will plan to see her back in 6 months with an echocardiogram and office visit at that time. We will also draw a BNP today to assess subclinical heart failure in the setting of her valvular heart disease.   Regarding the patient's leg edema, she will continue on furosemide at her current dose. I suspect she has significant venous insufficiency with prominent superficial leg veins bilaterally. Will refer her to vascular surgery for further evaluation. She is given information on leg elevation with the Lounge Doctor device, sodium restriction, and compression.   Deatra James 03/30/2019 6:56 PM     [address] [id number]  [phone] (office) [phone] (fax)

## 2019-03-30 LAB — PRO B NATRIURETIC PEPTIDE: NT-Pro BNP: 737 pg/mL — ABNORMAL HIGH (ref 0–301)

## 2019-04-12 ENCOUNTER — Encounter: Payer: Self-pay | Admitting: Cardiology

## 2019-04-12 ENCOUNTER — Ambulatory Visit (INDEPENDENT_AMBULATORY_CARE_PROVIDER_SITE_OTHER): Payer: PPO | Admitting: Cardiology

## 2019-04-12 ENCOUNTER — Other Ambulatory Visit: Payer: Self-pay

## 2019-04-12 VITALS — BP 120/70 | HR 81 | Ht 61.0 in | Wt 120.0 lb

## 2019-04-12 DIAGNOSIS — I350 Nonrheumatic aortic (valve) stenosis: Secondary | ICD-10-CM | POA: Diagnosis not present

## 2019-04-12 DIAGNOSIS — R600 Localized edema: Secondary | ICD-10-CM | POA: Diagnosis not present

## 2019-04-12 DIAGNOSIS — I872 Venous insufficiency (chronic) (peripheral): Secondary | ICD-10-CM

## 2019-04-12 HISTORY — DX: Venous insufficiency (chronic) (peripheral): I87.2

## 2019-04-12 NOTE — Patient Instructions (Signed)
Medication Instructions:  *If you need a refill on your cardiac medications before your next appointment, please call your pharmacy*  Lab Work: If you have labs (blood work) drawn today and your tests are completely normal, you will receive your results only by: Marland Kitchen MyChart Message (if you have MyChart) OR . A paper copy in the mail If you have any lab test that is abnormal or we need to change your treatment, we will call you to review the results.  Follow-Up: At [facility], you and your health needs are our priority.  As part of our continuing mission to provide you with exceptional heart care, we have created designated Provider Care Teams.  These Care Teams include your primary Cardiologist (physician) and Advanced Practice Providers (APPs -  Physician Assistants and Nurse Practitioners) who all work together to provide you with the care you need, when you need it.  We recommend signing up for the patient portal called "MyChart".  Sign up information is provided on this After Visit Summary.  MyChart is used to connect with patients for Virtual Visits (Telemedicine).  Patients are able to view lab/test results, encounter notes, upcoming appointments, etc.  Non-urgent messages can be sent to your provider as well.   To learn more about what you can do with MyChart, go to [email or web address].    Your next appointment:   4 month(s)  The format for your next appointment:   Either In Person or Virtual  Provider:    You will see Kardie Tobb, DO.

## 2019-04-12 NOTE — Progress Notes (Signed)
Cardiology Office Note:    Date:  04/12/2019   ID:  Denise Bailey, DOB [birth year], MRN [id number]  PCP:  Penelope Coop, FNP  Cardiologist:  Berniece Salines, DO  Electrophysiologist:  None   Referring MD: Penelope Coop, FNP   Follow visit  History of Present Illness:    Denise Bailey is a 74 y.o. female with a hx of severe aortic stenosis, hypothyroidism, cancer status post mastectomy.  Did see the patient initially on March 15, 2019 at that time an echocardiogram done at [facility] reported severe aortic stenosis. At the conclusion of the visit I referred the patient to our valve clinic for evaluation. She was also started on Lasix due to leg edema and concern for heart failure in the setting of her valvular heart disease.  In the interim she was able to be seen at the valve clinic. For now given the patient is asymptomatic and concern for likely error in LVOT measurement which may have affected the valve area - a repeat echo and follow up visit has been planned for 6 months.  She was also referred to vascular due to concern for venous insufficiency.   Today the patient is here for a follow up visit. She reports that she is still experiencing leg edema. She denies shortness of breath or chest pain.   Past Medical History:  Diagnosis Date  . Bilateral leg edema 03/15/2019  . Breast cancer ([place])   . Hypothyroidism   . Severe aortic stenosis 03/15/2019    Past Surgical History:  Procedure Laterality Date  . MASTECTOMY      Current Medications: Current Meds  Medication Sig  . furosemide (LASIX) 20 MG tablet Take 1 tab (20 mg) on Tues,Thurs, and Saturdays  . hydrochlorothiazide (HYDRODIURIL) 25 MG tablet Take 25 mg by mouth daily.  Marland Kitchen levothyroxine (SYNTHROID) 88 MCG tablet Take 88 mcg by mouth daily.  . potassium chloride SA (KLOR-CON M20) 20 MEQ tablet Take 1 tab (20 meq) on Tues, Thurs, and Saturday  . traMADol (ULTRAM) 50 MG tablet Take 100 mg by mouth 3  (three) times daily as needed.     Allergies:   Patient has no known allergies.   Social History   Socioeconomic History  . Marital status: Married    Spouse name: Not on file  . Number of children: Not on file  . Years of education: Not on file  . Highest education level: Not on file  Occupational History  . Not on file  Tobacco Use  . Smoking status: Never Smoker  . Smokeless tobacco: Never Used  Substance and Sexual Activity  . Alcohol use: Not on file  . Drug use: Not on file  . Sexual activity: Not on file  Other Topics Concern  . Not on file  Social History Narrative  . Not on file   Social Determinants of Health   Financial Resource Strain:   . Difficulty of Paying Living Expenses:   Food Insecurity:   . Worried About Charity fundraiser in the Last Year:   . Arboriculturist in the Last Year:   Transportation Needs:   . Film/video editor (Medical):   Marland Kitchen Lack of Transportation (Non-Medical):   Physical Activity:   . Days of Exercise per Week:   . Minutes of Exercise per Session:   Stress:   . Feeling of Stress :   Social Connections:   . Frequency of Communication with Friends and Family:   .  Frequency of Social Gatherings with Friends and Family:   . Attends Religious Services:   . Active Member of Clubs or Organizations:   . Attends Archivist Meetings:   Marland Kitchen Marital Status:      Family History: The patient's family history is not on file.  ROS:   Review of Systems  Constitution: Negative for decreased appetite, fever and weight gain.  HENT: Negative for congestion, ear discharge, hoarse voice and sore throat.   Eyes: Negative for discharge, redness, vision loss in right eye and visual halos.  Cardiovascular: Negative for chest pain, dyspnea on exertion, leg swelling, orthopnea and palpitations.  Respiratory: Negative for cough, hemoptysis, shortness of breath and snoring.   Endocrine: Negative for heat intolerance and polyphagia.    Hematologic/Lymphatic: Negative for bleeding problem. Does not bruise/bleed easily.  Skin: Negative for flushing, nail changes, rash and suspicious lesions.  Musculoskeletal: Negative for arthritis, joint pain, muscle cramps, myalgias, neck pain and stiffness.  Gastrointestinal: Negative for abdominal pain, bowel incontinence, diarrhea and excessive appetite.  Genitourinary: Negative for decreased libido, genital sores and incomplete emptying.  Neurological: Negative for brief paralysis, focal weakness, headaches and loss of balance.  Psychiatric/Behavioral: Negative for altered mental status, depression and suicidal ideas.  Allergic/Immunologic: Negative for HIV exposure and persistent infections.    EKGs/Labs/Other Studies Reviewed:    The following studies were reviewed today:   EKG:  None today   Echocardiogram from [facility] showed normal left ventricle systolic function 60 to 123456.  Diastolic function with impaired relaxation.  Right ventricle was normal in size and function.  Left atrium is normal.  Right atrium is normal in size.  Interatrial septum intact.  Aortic valve with severe aortic stenosis (mean gradient 48, aortic valve area by VTI 0.26 cm, peak velocity 4.12 m/s).  Mild aortic regurgitation.  Mild to moderate tricuspid vegetation present.  There is trace mitral regurgitation.  RVSP 42 mmHg.  Mild pulmonic stenosis.  The aortic root, ascending aorta and aortic arch appears to be normal size.   Recent Labs: 03/15/2019: BUN 20; Creatinine, Ser 0.73; Magnesium 2.1; Potassium 4.3; Sodium 141 03/29/2019: NT-Pro BNP 737  Recent Lipid Panel No results found for: CHOL, TRIG, HDL, CHOLHDL, VLDL, LDLCALC, LDLDIRECT  Physical Exam:    VS:  BP 120/70 (BP Location: Right Arm, Patient Position: Sitting, Cuff Size: Normal)   Pulse 81   Ht 5\' 1"  (1.549 m)   Wt 120 lb (54.4 kg)   SpO2 94%   BMI 22.67 kg/m     Wt Readings from Last 3 Encounters:  04/12/19 120 lb (54.4 kg)   03/29/19 120 lb (54.4 kg)  03/15/19 122 lb (55.3 kg)     GEN: Well nourished, well developed in no acute distress HEENT: Normal NECK: No JVD; No carotid bruits LYMPHATICS: No lymphadenopathy CARDIAC: S1S2 noted,RRR, no murmurs, rubs, gallops RESPIRATORY:  Clear to auscultation without rales, wheezing or rhonchi  ABDOMEN: Soft, non-tender, non-distended, +bowel sounds, no guarding. EXTREMITIES: No edema, No cyanosis, no clubbing MUSCULOSKELETAL:  No deformity  SKIN: Warm and dry NEUROLOGIC:  Alert and oriented x 3, non-focal PSYCHIATRIC:  Normal affect, good insight  ASSESSMENT:    1. Severe aortic stenosis   2. Bilateral leg edema   3. Venous insufficiency    PLAN:     Still asymptomatic - plan to repeat TTE at her next echo. She plans to see the valve clinic (Dr. Burt Knack in 6 months). Her BNP was elevated suggestion subclinical heart failure. I  will continue the patient on the current dose of Lasix.   Her bilateral leg edema may be multifactorial and venous insufficiency may be playing a role. Continue Lasix, encouraged patient to elevate legs as much as possible. She plans to see vascular surgery on 05/17/19.   The patient is in agreement with the above plan. The patient left the office in stable condition.  The patient will follow up in 4 months.   Medication Adjustments/Labs and Tests Ordered: Current medicines are reviewed at length with the patient today.  Concerns regarding medicines are outlined above.  No orders of the defined types were placed in this encounter.  No orders of the defined types were placed in this encounter.   Patient Instructions  Medication Instructions:  *If you need a refill on your cardiac medications before your next appointment, please call your pharmacy*  Lab Work: If you have labs (blood work) drawn today and your tests are completely normal, you will receive your results only by: Marland Kitchen MyChart Message (if you have MyChart) OR . A paper  copy in the mail If you have any lab test that is abnormal or we need to change your treatment, we will call you to review the results.  Follow-Up: At [facility], you and your health needs are our priority.  As part of our continuing mission to provide you with exceptional heart care, we have created designated Provider Care Teams.  These Care Teams include your primary Cardiologist (physician) and Advanced Practice Providers (APPs -  Physician Assistants and Nurse Practitioners) who all work together to provide you with the care you need, when you need it.  We recommend signing up for the patient portal called "MyChart".  Sign up information is provided on this After Visit Summary.  MyChart is used to connect with patients for Virtual Visits (Telemedicine).  Patients are able to view lab/test results, encounter notes, upcoming appointments, etc.  Non-urgent messages can be sent to your provider as well.   To learn more about what you can do with MyChart, go to [email or web address].    Your next appointment:   4 month(s)  The format for your next appointment:   Either In Person or Virtual  Provider:    You will see Brunetta Newingham, DO.        Adopting a Healthy Lifestyle.  Know what a healthy weight is for you (roughly BMI <25) and aim to maintain this   Aim for 7+ servings of fruits and vegetables daily   65-80+ fluid ounces of water or unsweet tea for healthy kidneys   Limit to max 1 drink of alcohol per day; avoid smoking/tobacco   Limit animal fats in diet for cholesterol and heart health - choose grass fed whenever available   Avoid highly processed foods, and foods high in saturated/trans fats   Aim for low stress - take time to unwind and care for your mental health   Aim for 150 min of moderate intensity exercise weekly for heart health, and weights twice weekly for bone health   Aim for 7-9 hours of sleep daily   When it comes to diets, agreement about the  perfect plan isnt easy to find, even among the experts. Experts at the [employer] developed an idea known as the Healthy Eating Plate. Just imagine a plate divided into logical, healthy portions.   The emphasis is on diet quality:   Load up on vegetables and fruits - one-half of your plate:  Aim for color and variety, and remember that potatoes dont count.   Go for whole grains - one-quarter of your plate: Whole wheat, barley, wheat berries, quinoa, oats, brown rice, and foods made with them. If you want pasta, go with whole wheat pasta.   Protein power - one-quarter of your plate: Fish, chicken, beans, and nuts are all healthy, versatile protein sources. Limit red meat.   The diet, however, does go beyond the plate, offering a few other suggestions.   Use healthy plant oils, such as olive, canola, soy, corn, sunflower and peanut. Check the labels, and avoid partially hydrogenated oil, which have unhealthy trans fats.   If youre thirsty, drink water. Coffee and tea are good in moderation, but skip sugary drinks and limit milk and dairy products to one or two daily servings.   The type of carbohydrate in the diet is more important than the amount. Some sources of carbohydrates, such as vegetables, fruits, whole grains, and beans-are healthier than others.   Finally, stay active  Signed, Berniece Salines, DO  04/12/2019 1:34 PM    [facility]

## 2019-04-14 ENCOUNTER — Ambulatory Visit: Payer: Self-pay | Admitting: Cardiology

## 2019-04-22 DIAGNOSIS — L0103 Bullous impetigo: Secondary | ICD-10-CM | POA: Diagnosis not present

## 2019-04-22 DIAGNOSIS — I739 Peripheral vascular disease, unspecified: Secondary | ICD-10-CM | POA: Diagnosis not present

## 2019-04-30 DIAGNOSIS — Z6822 Body mass index (BMI) 22.0-22.9, adult: Secondary | ICD-10-CM | POA: Diagnosis not present

## 2019-04-30 DIAGNOSIS — L03115 Cellulitis of right lower limb: Secondary | ICD-10-CM | POA: Diagnosis not present

## 2019-05-03 DIAGNOSIS — L03115 Cellulitis of right lower limb: Secondary | ICD-10-CM | POA: Diagnosis not present

## 2019-05-03 DIAGNOSIS — Z6822 Body mass index (BMI) 22.0-22.9, adult: Secondary | ICD-10-CM | POA: Diagnosis not present

## 2019-05-10 ENCOUNTER — Encounter: Payer: PPO | Admitting: Surgery

## 2019-05-10 ENCOUNTER — Encounter (HOSPITAL_COMMUNITY): Payer: PPO

## 2019-05-17 ENCOUNTER — Encounter: Payer: PPO | Admitting: Surgery

## 2019-05-17 ENCOUNTER — Encounter (HOSPITAL_COMMUNITY): Payer: PPO

## 2019-05-17 ENCOUNTER — Ambulatory Visit (INDEPENDENT_AMBULATORY_CARE_PROVIDER_SITE_OTHER): Payer: PPO | Admitting: Surgery

## 2019-05-17 ENCOUNTER — Encounter: Payer: Self-pay | Admitting: Surgery

## 2019-05-17 ENCOUNTER — Other Ambulatory Visit: Payer: Self-pay

## 2019-05-17 ENCOUNTER — Ambulatory Visit (HOSPITAL_COMMUNITY)
Admission: RE | Admit: 2019-05-17 | Discharge: 2019-05-17 | Disposition: A | Discharge status: Home / Self Care | Payer: PPO | Source: Ambulatory Visit | Attending: Cardiovascular Disease | Admitting: Cardiovascular Disease

## 2019-05-17 VITALS — BP 143/82 | HR 84 | Temp 97.7°F | Resp 20 | Ht 61.0 in | Wt 120.0 lb

## 2019-05-17 DIAGNOSIS — R600 Localized edema: Secondary | ICD-10-CM | POA: Insufficient documentation

## 2019-05-17 DIAGNOSIS — I872 Venous insufficiency (chronic) (peripheral): Secondary | ICD-10-CM

## 2019-05-17 NOTE — Progress Notes (Signed)
[facility] [place]  Patient name: Denise Bailey MRN: [id number] DOB: 04-02-1945 Sex: female   REQUESTING PROVIDER:    Dr. Burt Knack    REASON FOR CONSULT:    Leg swelling  HISTORY OF PRESENT ILLNESS:   Denise Bailey is a 74 y.o. female, who is referred for evaluation of bilateral leg swelling which has developed over the past 4 to 5 months.  She has tried compression socks but has a very difficult time getting them on.  She is also utilize diuretics and creams and lotions which have not helped.  The patient now has severe aortic stenosis.  She has a history of rheumatic fever and remote breast cancer status post mastectomy.  PAST MEDICAL HISTORY    Past Medical History:  Diagnosis Date  . Bilateral leg edema 03/15/2019  . Breast cancer ([place])   . Hypothyroidism   . Severe aortic stenosis 03/15/2019     FAMILY HISTORY   History reviewed. No pertinent family history.  SOCIAL HISTORY:   Social History   Socioeconomic History  . Marital status: Married    Spouse name: Not on file  . Number of children: Not on file  . Years of education: Not on file  . Highest education level: Not on file  Occupational History  . Not on file  Tobacco Use  . Smoking status: Never Smoker  . Smokeless tobacco: Never Used  Substance and Sexual Activity  . Alcohol use: Never  . Drug use: Never  . Sexual activity: Not on file  Other Topics Concern  . Not on file  Social History Narrative  . Not on file   Social Determinants of Health   Financial Resource Strain:   . Difficulty of Paying Living Expenses:   Food Insecurity:   . Worried About Charity fundraiser in the Last Year:   . Arboriculturist in the Last Year:   Transportation Needs:   . Film/video editor (Medical):   Marland Kitchen Lack of Transportation (Non-Medical):   Physical Activity:   . Days of Exercise per Week:   . Minutes of Exercise per Session:    Stress:   . Feeling of Stress :   Social Connections:   . Frequency of Communication with Friends and Family:   . Frequency of Social Gatherings with Friends and Family:   . Attends Religious Services:   . Active Member of Clubs or Organizations:   . Attends Archivist Meetings:   Marland Kitchen Marital Status:   Intimate Partner Violence:   . Fear of Current or Ex-Partner:   . Emotionally Abused:   Marland Kitchen Physically Abused:   . Sexually Abused:     ALLERGIES:    No Known Allergies  CURRENT MEDICATIONS:    Current Outpatient Medications  Medication Sig Dispense Refill  . furosemide (LASIX) 20 MG tablet Take 1 tab (20 mg) on Tues,Thurs, and Saturdays 45 tablet 1  . hydrochlorothiazide (HYDRODIURIL) 25 MG tablet Take 25 mg by mouth daily.    Marland Kitchen levothyroxine (SYNTHROID) 88 MCG tablet Take 88 mcg by mouth daily.    . potassium chloride SA (KLOR-CON M20) 20 MEQ tablet Take 1 tab (20 meq) on Tues, Thurs, and Saturday 45 tablet 1  . traMADol (ULTRAM) 50 MG tablet Take 100 mg by mouth 3 (three) times daily as needed.     No current facility-administered medications for this visit.    REVIEW OF SYSTEMS:   [X]  denotes positive finding, [ ]   denotes negative finding Cardiac  Comments:  Chest pain or chest pressure:    Shortness of breath upon exertion:    Short of breath when lying flat:    Irregular heart rhythm:        Vascular    Pain in calf, thigh, or hip brought on by ambulation: x   Pain in feet at night that wakes you up from your sleep:  x   Blood clot in your veins:    Leg swelling:  x       Pulmonary    Oxygen at home:    Productive cough:     Wheezing:         Neurologic    Sudden weakness in arms or legs:  x   Sudden numbness in arms or legs:     Sudden onset of difficulty speaking or slurred speech:    Temporary loss of vision in one eye:     Problems with dizziness:         Gastrointestinal    Blood in stool:      Vomited blood:         Genitourinary     Burning when urinating:     Blood in urine:        Psychiatric    Major depression:         Hematologic    Bleeding problems:    Problems with blood clotting too easily:        Skin    Rashes or ulcers:        Constitutional    Fever or chills:     PHYSICAL EXAM:   Vitals:   05/17/19 1410  BP: (!) 143/82  Pulse: 84  Resp: 20  Temp: 97.7 F (36.5 C)  SpO2: 97%  Weight: 120 lb (54.4 kg)  Height: 5\' 1"  (1.549 m)    GENERAL: The patient is a well-nourished female, in no acute distress. The vital signs are documented above. CARDIAC: There is a regular rate and rhythm.  VASCULAR: 2+ pitting edema bilateral lower extremity.  She does have a fluid-filled blister about 1.5 cm in diameter on the posterior aspect of her right leg.  She has triphasic Doppler signals in her bilateral dorsalis pedis arteries. PULMONARY: Nonlabored respirations MUSCULOSKELETAL: There are no major deformities or cyanosis. NEUROLOGIC: No focal weakness or paresthesias are detected. SKIN: There are no ulcers or rashes noted. PSYCHIATRIC: The patient has a normal affect.  STUDIES:   I have reviewed the following: Right:  - No evidence of superficial venous reflux seen in the right greater  saphenous vein.  - No evidence of superficial venous reflux seen in the right short  saphenous vein.  - Venous reflux is noted in the right common femoral vein.    Left:  - No evidence of superficial venous reflux seen in the left greater  saphenous vein.  - No evidence of superficial venous reflux seen in the left short  saphenous vein.  - Color duplex evaluation of the left lower extremity shows there is  thrombus in the distal greater saphenous vein.  - Venous reflux is noted in the left common femoral vein.  - Bulging lower leg varicosities arise from a distal thigh, anterolateral  incompetent perforating vein.   ASSESSMENT and PLAN   Chronic leg swelling: The patient does have evidence of reflux in  the deep venous system in the left common femoral vein.  She has pulsatile venous flow on Doppler ultrasound which  suggests volume overload or possible heart failure.  She is not a candidate for laser ablation or other venous interventions.  I discussed with her the importance of wearing compression stockings.  I have asked her to go to [employer] to get fitted and try some of the devices to help get these compression socks on.  I suggested referral to a lymphedema therapist however she would like to wait on that.  If she has any other concerns or questions she knows to contact me   Annamarie Major, IV, MD, FACS [facility] [phone] Pager [phone]

## 2019-05-20 ENCOUNTER — Telehealth: Payer: Self-pay | Admitting: Cardiovascular Disease

## 2019-05-20 NOTE — Telephone Encounter (Signed)
New Message  Patient is calling in to let Dr. Burt Knack know that she is ready to have the surgery on her heart. Please give patient a call back tomorrow (per patient request) to discuss.

## 2019-05-21 NOTE — Telephone Encounter (Signed)
The patient saw [facility] and is ready to proceed with TAVR. Will talk with Dr. Burt Knack to make plan and touch base with her on Monday. She was grateful for assistance.

## 2019-05-24 NOTE — Telephone Encounter (Signed)
Discussed with Dr. Burt Knack. Scheduled patient 5/6 with Nell Range to continue TAVR work up process. She was grateful for call and agrees with treatment plan.

## 2019-05-25 NOTE — H&P (View-Only) (Signed)
[facility] [place]                                      Cardiology Office Note    Date:  05/27/2019   ID:  Denise Bailey, DOB 03-18-1945, MRN [id number]  PCP:  Penelope Coop, FNP  Cardiologist:  Dr. Harriet Masson  CC: severe AS follow up - set up testing for TAVR  History of Present Illness:  Denise Bailey is a 74 y.o. female with a history of breast cancer s/p mastectomy, hypothyroidism, chronic LE edema and severe aortic stenosis who presents to clinic for follow up.   The patient was recently referred to Dr. Harriet Masson after being diagnosed with severe aortic stenosis. Echo at [facility] showed normal LV systolic function with mod-severe calcifications  and restriction of the aortic valve leaflets. The aortic valve is difficult to visualize secondary to poor acoustic windows. Peak and mean gradients are 68 and 48 mmHg, respectively. The valve area calculates very small at 0.33 square cm but this was not felt to be accurate as her LVOT was measured at 1.7 cm.  She was referred to Dr. Burt Knack in 03/2019 for evaluation of severe AS. At that time she strongly desired continued observation and close follow up. Her sister was also ill and on hospice. She was felt to be asymptomatic aside from significant LE edema with associated leg pain. Follow up BNP was elevated >700. She was sent to vascular surgery for evaluation by Dr. Trula Slade who felt it was chronic venous insufficiency and recommended compression stockings. He did think there was some evidence of volume overload based on his work up. She called back into our office and asked to continue the TAVR work up.   Today she presents to clinic for follow up. She is here with her friend. She is doing well other than her legs that continue to swell and blister. She has significant pain in her LE extremities. Otherwise she is totally asymptomatic. No CP or SOB. No orthopnea or PND. No dizziness or syncope.  No blood in stool or urine. No palpitations.     Past Medical History:  Diagnosis Date  . Bilateral leg edema 03/15/2019  . Breast cancer ([place])   . Hypothyroidism   . Severe aortic stenosis 03/15/2019    Past Surgical History:  Procedure Laterality Date  . MASTECTOMY      Current Medications: Outpatient Medications Prior to Visit  Medication Sig Dispense Refill  . furosemide (LASIX) 20 MG tablet Take 1 tab (20 mg) on Tues,Thurs, and Saturdays 45 tablet 1  . hydrochlorothiazide (HYDRODIURIL) 25 MG tablet Take 25 mg by mouth daily.    Marland Kitchen levothyroxine (SYNTHROID) 88 MCG tablet Take 88 mcg by mouth daily.    . potassium chloride SA (KLOR-CON M20) 20 MEQ tablet Take 1 tab (20 meq) on Tues, Thurs, and Saturday 45 tablet 1  . traMADol (ULTRAM) 50 MG tablet Take 100 mg by mouth 3 (three) times daily as needed.     No facility-administered medications prior to visit.     Allergies:   Patient has no known allergies.   Social History   Socioeconomic History  . Marital status: Married    Spouse name: Not on file  . Number of children: Not on file  . Years of education: Not on file  . Highest education level: Not  on file  Occupational History  . Not on file  Tobacco Use  . Smoking status: Never Smoker  . Smokeless tobacco: Never Used  Substance and Sexual Activity  . Alcohol use: Never  . Drug use: Never  . Sexual activity: Not on file  Other Topics Concern  . Not on file  Social History Narrative  . Not on file   Social Determinants of Health   Financial Resource Strain:   . Difficulty of Paying Living Expenses:   Food Insecurity:   . Worried About Charity fundraiser in the Last Year:   . Arboriculturist in the Last Year:   Transportation Needs:   . Film/video editor (Medical):   Marland Kitchen Lack of Transportation (Non-Medical):   Physical Activity:   . Days of Exercise per Week:   . Minutes of Exercise per Session:   Stress:   . Feeling of Stress :   Social  Connections:   . Frequency of Communication with Friends and Family:   . Frequency of Social Gatherings with Friends and Family:   . Attends Religious Services:   . Active Member of Clubs or Organizations:   . Attends Archivist Meetings:   Marland Kitchen Marital Status:      Family History:  The patient's family history is not on file.     ROS:   Please see the history of present illness.    ROS All other systems reviewed and are negative.   PHYSICAL EXAM:   VS:  BP (!) 122/58   Pulse 82   Ht 5\' 1"  (1.549 m)   Wt 109 lb (49.4 kg)   SpO2 98%   BMI 20.60 kg/m    GEN: Well nourished, well developed, in no acute distress HEENT: normal Neck: no JVD or masses Cardiac: RRR; 3/6 SEM. No rubs, or gallops. 2+ bilateral LE edema with erythema and blistering Respiratory:  clear to auscultation bilaterally, normal work of breathing GI: soft, nontender, nondistended, + BS MS: no deformity or atrophy Skin: warm and dry, no rash Neuro:  Alert and Oriented x 3, Strength and sensation are intact Psych: euthymic mood, full affect   Wt Readings from Last 3 Encounters:  05/27/19 109 lb (49.4 kg)  05/17/19 120 lb (54.4 kg)  04/12/19 120 lb (54.4 kg)      Studies/Labs Reviewed:   EKG:  EKG is ordered today.  The ekg ordered today demonstrates sinus   Recent Labs: 03/15/2019: BUN 20; Creatinine, Ser 0.73; Magnesium 2.1; Potassium 4.3; Sodium 141 03/29/2019: NT-Pro BNP 737   Lipid Panel No results found for: CHOL, TRIG, HDL, CHOLHDL, VLDL, LDLCALC, LDLDIRECT  Additional studies/ records that were reviewed today include:  Echo - no report avaible (Done at [facility])  ____________  Right:  - No evidence of superficial venous reflux seen in the right greater  saphenous vein.  - No evidence of superficial venous reflux seen in the right short  saphenous vein.  - Venous reflux is noted in the right common femoral vein.   Left:  - No evidence of superficial venous reflux seen in the  left greater  saphenous vein.  - No evidence of superficial venous reflux seen in the left short  saphenous vein.  - Color duplex evaluation of the left lower extremity shows there is  thrombus in the distal greater saphenous vein.  - Venous reflux is noted in the left common femoral vein.  - Bulging lower leg varicosities arise from a distal  thigh, anterolateral  incompetent perforating vein.   _________________  STS Score Procedure: Isolated AVR Risk of Mortality: 1.577% Renal Failure: 0.923% Permanent Stroke: 1.509% Prolonged Ventilation: 4.234% DSW Infection: 0.063% Reoperation: 3.422% Morbidity or Mortality: 7.750% Short Length of Stay: 45.396% Long Length of Stay: 3.300%   ASSESSMENT & PLAN:   Severe AS: she has severe stage C aortic stenosis with NYHA class I symptoms. She does not have symptoms of chest pain, shortness of breath or syncope. She denies fatigue. She is mostly limited by her LE edema with associated blistering and pain. Dr. Trula Slade did say there was some evidence of volume overload according his testing, so it's possible fixing her valve may improve her LE edema to some degree. I have encouraged her to keep wrapping her legs and wearing compression stockings. She is ready to proceed with TAVR work up. Will start with [facility].  I have reviewed the risks, indications, and alternatives to cardiac catheterization and possible angioplasty/stenting with the patient. Risks include but are not limited to bleeding, infection, vascular injury, stroke, myocardial infection, arrhythmia, kidney injury, radiation-related injury in the case of prolonged fluoroscopy use, emergency cardiac surgery, and death. The patient understands the risks of serious complication is low (123456).     Medication Adjustments/Labs and Tests Ordered: Current medicines are reviewed at length with the patient today.  Concerns regarding medicines are outlined above.  Medication changes, Labs and Tests  ordered today are listed in the Patient Instructions below. Patient Instructions  COVID SCREENING INFORMATION (5/17): You are scheduled for your drive-thru COVID screening on 06/07/2019 at  [facility] (old [facility]) [address] Stay in the RIGHT lane and proceed under the brick awning and tell them you are there for pre-procedure testing. Do NOT bring any pets with you to the testing site. You will need to go home after your screening and quarantine until your procedure.   CATHETERIZATION INSTRUCTIONS (5/19): You are scheduled for a Cardiac Catheterization on: Wednesday, 06/09/2019 with Dr. Burt Knack.  1. Please arrive at the [facility] (Main Entrance A) at [facility]: [address] [place], [place] [address] at: 8:00AM. (This time is two hours before your procedure to ensure your preparation). Free valet parking service is available. You are allowed ONE visitor in the waiting room during your procedure. Both you and your guest must wear masks. Special note: Every effort is made to have your procedure done on time. Please understand that emergencies sometimes delay scheduled procedures.  2. Diet: Do not eat solid foods after midnight.  You may have clear liquids until 5am upon the day of the procedure.  3. Labs: TODAY!  4. Medication instructions in preparation for your procedure:  1) HOLD LASIX the morning of your catheterization  2) HOLD HYDROCHLOROTHIAZIDE the morning of your cath  3) TAKE ASPIRIN 81 mg the morning of your cath  4) You may take your other medications as directed with sips of water  5. Plan for one night stay--bring personal belongings. 6. Bring a current list of your medications and current insurance cards. 7. You MUST have a responsible person to drive you home. 8. Someone MUST be with you the first 24 hours after you arrive home or your discharge will be delayed. 9. Please wear clothes that are easy to get on and off and wear  slip-on shoes.  Thank you for allowing [place] to care for you!   -- [place] [facility]    Signed,  Angelena Form, PA-C  05/27/2019 4:29 PM    [facility] [place], [place],   [address] Phone: [phone]; Fax: [phone]

## 2019-05-25 NOTE — H&P (View-Only) (Signed)
HE[facility] AND [place]                                      Cardiology Office Note    Date:  05/27/2019   ID:  Denise Bailey, DOB 1945-05-11, MRN [id number]  PCP:  Penelope Coop, FNP  Cardiologist:  Dr. Harriet Masson  CC: severe AS follow up - set up testing for TAVR  History of Present Illness:  Denise Bailey is a 74 y.o. female with a history of breast cancer s/p mastectomy, hypothyroidism, chronic LE edema and severe aortic stenosis who presents to clinic for follow up.   The patient was recently referred to Dr. Harriet Masson after being diagnosed with severe aortic stenosis. Echo at [facility] showed normal LV systolic function with mod-severe calcifications  and restriction of the aortic valve leaflets. The aortic valve is difficult to visualize secondary to poor acoustic windows. Peak and mean gradients are 68 and 48 mmHg, respectively. The valve area calculates very small at 0.33 square cm but this was not felt to be accurate as her LVOT was measured at 1.7 cm.  She was referred to Dr. Burt Knack in 03/2019 for evaluation of severe AS. At that time she strongly desired continued observation and close follow up. Her sister was also ill and on hospice. She was felt to be asymptomatic aside from significant LE edema with associated leg pain. Follow up BNP was elevated >700. She was sent to vascular surgery for evaluation by Dr. Trula Slade who felt it was chronic venous insufficiency and recommended compression stockings. He did think there was some evidence of volume overload based on his work up. She called back into our office and asked to continue the TAVR work up.   Today she presents to clinic for follow up. She is here with her friend. She is doing well other than her legs that continue to swell and blister. She has significant pain in her LE extremities. Otherwise she is totally asymptomatic. No CP or SOB. No orthopnea or PND. No dizziness or syncope.  No blood in stool or urine. No palpitations.     Past Medical History:  Diagnosis Date  . Bilateral leg edema 03/15/2019  . Breast cancer ([place])   . Hypothyroidism   . Severe aortic stenosis 03/15/2019    Past Surgical History:  Procedure Laterality Date  . MASTECTOMY      Current Medications: Outpatient Medications Prior to Visit  Medication Sig Dispense Refill  . furosemide (LASIX) 20 MG tablet Take 1 tab (20 mg) on Tues,Thurs, and Saturdays 45 tablet 1  . hydrochlorothiazide (HYDRODIURIL) 25 MG tablet Take 25 mg by mouth daily.    Marland Kitchen levothyroxine (SYNTHROID) 88 MCG tablet Take 88 mcg by mouth daily.    . potassium chloride SA (KLOR-CON M20) 20 MEQ tablet Take 1 tab (20 meq) on Tues, Thurs, and Saturday 45 tablet 1  . traMADol (ULTRAM) 50 MG tablet Take 100 mg by mouth 3 (three) times daily as needed.     No facility-administered medications prior to visit.     Allergies:   Patient has no known allergies.   Social History   Socioeconomic History  . Marital status: Married    Spouse name: Not on file  . Number of children: Not on file  . Years of education: Not on file  . Highest education level: Not  on file  Occupational History  . Not on file  Tobacco Use  . Smoking status: Never Smoker  . Smokeless tobacco: Never Used  Substance and Sexual Activity  . Alcohol use: Never  . Drug use: Never  . Sexual activity: Not on file  Other Topics Concern  . Not on file  Social History Narrative  . Not on file   Social Determinants of Health   Financial Resource Strain:   . Difficulty of Paying Living Expenses:   Food Insecurity:   . Worried About Charity fundraiser in the Last Year:   . Arboriculturist in the Last Year:   Transportation Needs:   . Film/video editor (Medical):   Marland Kitchen Lack of Transportation (Non-Medical):   Physical Activity:   . Days of Exercise per Week:   . Minutes of Exercise per Session:   Stress:   . Feeling of Stress :   Social  Connections:   . Frequency of Communication with Friends and Family:   . Frequency of Social Gatherings with Friends and Family:   . Attends Religious Services:   . Active Member of Clubs or Organizations:   . Attends Archivist Meetings:   Marland Kitchen Marital Status:      Family History:  The patient's family history is not on file.     ROS:   Please see the history of present illness.    ROS All other systems reviewed and are negative.   PHYSICAL EXAM:   VS:  BP (!) 122/58   Pulse 82   Ht 5\' 1"  (1.549 m)   Wt 109 lb (49.4 kg)   SpO2 98%   BMI 20.60 kg/m    GEN: Well nourished, well developed, in no acute distress HEENT: normal Neck: no JVD or masses Cardiac: RRR; 3/6 SEM. No rubs, or gallops. 2+ bilateral LE edema with erythema and blistering Respiratory:  clear to auscultation bilaterally, normal work of breathing GI: soft, nontender, nondistended, + BS MS: no deformity or atrophy Skin: warm and dry, no rash Neuro:  Alert and Oriented x 3, Strength and sensation are intact Psych: euthymic mood, full affect   Wt Readings from Last 3 Encounters:  05/27/19 109 lb (49.4 kg)  05/17/19 120 lb (54.4 kg)  04/12/19 120 lb (54.4 kg)      Studies/Labs Reviewed:   EKG:  EKG is ordered today.  The ekg ordered today demonstrates sinus   Recent Labs: 03/15/2019: BUN 20; Creatinine, Ser 0.73; Magnesium 2.1; Potassium 4.3; Sodium 141 03/29/2019: NT-Pro BNP 737   Lipid Panel No results found for: CHOL, TRIG, HDL, CHOLHDL, VLDL, LDLCALC, LDLDIRECT  Additional studies/ records that were reviewed today include:  Echo - no report avaible (Done at [facility])  ____________  Right:  - No evidence of superficial venous reflux seen in the right greater  saphenous vein.  - No evidence of superficial venous reflux seen in the right short  saphenous vein.  - Venous reflux is noted in the right common femoral vein.   Left:  - No evidence of superficial venous reflux seen in the  left greater  saphenous vein.  - No evidence of superficial venous reflux seen in the left short  saphenous vein.  - Color duplex evaluation of the left lower extremity shows there is  thrombus in the distal greater saphenous vein.  - Venous reflux is noted in the left common femoral vein.  - Bulging lower leg varicosities arise from a distal  thigh, anterolateral  incompetent perforating vein.   _________________  STS Score Procedure: Isolated AVR Risk of Mortality: 1.577% Renal Failure: 0.923% Permanent Stroke: 1.509% Prolonged Ventilation: 4.234% DSW Infection: 0.063% Reoperation: 3.422% Morbidity or Mortality: 7.750% Short Length of Stay: 45.396% Long Length of Stay: 3.300%   ASSESSMENT & PLAN:   Severe AS: she has severe stage C aortic stenosis with NYHA class I symptoms. She does not have symptoms of chest pain, shortness of breath or syncope. She denies fatigue. She is mostly limited by her LE edema with associated blistering and pain. Dr. Trula Slade did say there was some evidence of volume overload according his testing, so it's possible fixing her valve may improve her LE edema to some degree. I have encouraged her to keep wrapping her legs and wearing compression stockings. She is ready to proceed with TAVR work up. Will start with [facility].  I have reviewed the risks, indications, and alternatives to cardiac catheterization and possible angioplasty/stenting with the patient. Risks include but are not limited to bleeding, infection, vascular injury, stroke, myocardial infection, arrhythmia, kidney injury, radiation-related injury in the case of prolonged fluoroscopy use, emergency cardiac surgery, and death. The patient understands the risks of serious complication is low (123456).     Medication Adjustments/Labs and Tests Ordered: Current medicines are reviewed at length with the patient today.  Concerns regarding medicines are outlined above.  Medication changes, Labs and Tests  ordered today are listed in the Patient Instructions below. Patient Instructions  COVID SCREENING INFORMATION (5/17): You are scheduled for your drive-thru COVID screening on 06/07/2019 at  [facility] (old [facility]) [address] Stay in the RIGHT lane and proceed under the brick awning and tell them you are there for pre-procedure testing. Do NOT bring any pets with you to the testing site. You will need to go home after your screening and quarantine until your procedure.   CATHETERIZATION INSTRUCTIONS (5/19): You are scheduled for a Cardiac Catheterization on: Wednesday, 06/09/2019 with Dr. Burt Knack.  1. Please arrive at the [facility] (Main Entrance A) at [facility]: [address] [place],  [address] at: 8:00AM. (This time is two hours before your procedure to ensure your preparation). Free valet parking service is available. You are allowed ONE visitor in the waiting room during your procedure. Both you and your guest must wear masks. Special note: Every effort is made to have your procedure done on time. Please understand that emergencies sometimes delay scheduled procedures.  2. Diet: Do not eat solid foods after midnight.  You may have clear liquids until 5am upon the day of the procedure.  3. Labs: TODAY!  4. Medication instructions in preparation for your procedure:  1) HOLD LASIX the morning of your catheterization  2) HOLD HYDROCHLOROTHIAZIDE the morning of your cath  3) TAKE ASPIRIN 81 mg the morning of your cath  4) You may take your other medications as directed with sips of water  5. Plan for one night stay--bring personal belongings. 6. Bring a current list of your medications and current insurance cards. 7. You MUST have a responsible person to drive you home. 8. Someone MUST be with you the first 24 hours after you arrive home or your discharge will be delayed. 9. Please wear clothes that are easy to get on and off and wear  slip-on shoes.  Thank you for allowing [place] to care for you!   -- [place] [facility]    Signed,  Angelena Form, PA-C  05/27/2019 4:29 PM    [facility] [place] [address], [place], [place]  [address] Phone: [phone]; Fax: [phone]

## 2019-05-25 NOTE — Progress Notes (Addendum)
[facility] [place]                                      Cardiology Office Note    Date:  05/27/2019   ID:  Denise Bailey, DOB [birth year], MRN [id number]  PCP:  Penelope Coop, FNP  Cardiologist:  Dr. Harriet Masson  CC: severe AS follow up - set up testing for TAVR  History of Present Illness:  Denise Bailey is a 74 y.o. female with a history of breast cancer s/p mastectomy, hypothyroidism, chronic LE edema and severe aortic stenosis who presents to clinic for follow up.   The patient was recently referred to Dr. Harriet Masson after being diagnosed with severe aortic stenosis. Echo at [facility] showed normal LV systolic function with mod-severe calcifications  and restriction of the aortic valve leaflets. The aortic valve is difficult to visualize secondary to poor acoustic windows. Peak and mean gradients are 68 and 48 mmHg, respectively. The valve area calculates very small at 0.33 square cm but this was not felt to be accurate as her LVOT was measured at 1.7 cm.  She was referred to Dr. Burt Knack in 03/2019 for evaluation of severe AS. At that time she strongly desired continued observation and close follow up. Her sister was also ill and on hospice. She was felt to be asymptomatic aside from significant LE edema with associated leg pain. Follow up BNP was elevated >700. She was sent to vascular surgery for evaluation by Dr. Trula Slade who felt it was chronic venous insufficiency and recommended compression stockings. He did think there was some evidence of volume overload based on his work up. She called back into our office and asked to continue the TAVR work up.   Today she presents to clinic for follow up. She is here with her friend. She is doing well other than her legs that continue to swell and blister. She has significant pain in her LE extremities. Otherwise she is totally asymptomatic. No CP or SOB. No orthopnea or PND. No dizziness or syncope.  No blood in stool or urine. No palpitations.     Past Medical History:  Diagnosis Date  . Bilateral leg edema 03/15/2019  . Breast cancer ([place])   . Hypothyroidism   . Severe aortic stenosis 03/15/2019    Past Surgical History:  Procedure Laterality Date  . MASTECTOMY      Current Medications: Outpatient Medications Prior to Visit  Medication Sig Dispense Refill  . furosemide (LASIX) 20 MG tablet Take 1 tab (20 mg) on Tues,Thurs, and Saturdays 45 tablet 1  . hydrochlorothiazide (HYDRODIURIL) 25 MG tablet Take 25 mg by mouth daily.    Marland Kitchen levothyroxine (SYNTHROID) 88 MCG tablet Take 88 mcg by mouth daily.    . potassium chloride SA (KLOR-CON M20) 20 MEQ tablet Take 1 tab (20 meq) on Tues, Thurs, and Saturday 45 tablet 1  . traMADol (ULTRAM) 50 MG tablet Take 100 mg by mouth 3 (three) times daily as needed.     No facility-administered medications prior to visit.     Allergies:   Patient has no known allergies.   Social History   Socioeconomic History  . Marital status: Married    Spouse name: Not on file  . Number of children: Not on file  . Years of education: Not on file  . Highest education level: Not  on file  Occupational History  . Not on file  Tobacco Use  . Smoking status: Never Smoker  . Smokeless tobacco: Never Used  Substance and Sexual Activity  . Alcohol use: Never  . Drug use: Never  . Sexual activity: Not on file  Other Topics Concern  . Not on file  Social History Narrative  . Not on file   Social Determinants of Health   Financial Resource Strain:   . Difficulty of Paying Living Expenses:   Food Insecurity:   . Worried About Charity fundraiser in the Last Year:   . Arboriculturist in the Last Year:   Transportation Needs:   . Film/video editor (Medical):   Marland Kitchen Lack of Transportation (Non-Medical):   Physical Activity:   . Days of Exercise per Week:   . Minutes of Exercise per Session:   Stress:   . Feeling of Stress :   Social  Connections:   . Frequency of Communication with Friends and Family:   . Frequency of Social Gatherings with Friends and Family:   . Attends Religious Services:   . Active Member of Clubs or Organizations:   . Attends Archivist Meetings:   Marland Kitchen Marital Status:      Family History:  The patient's family history is not on file.     ROS:   Please see the history of present illness.    ROS All other systems reviewed and are negative.   PHYSICAL EXAM:   VS:  BP (!) 122/58   Pulse 82   Ht 5\' 1"  (1.549 m)   Wt 109 lb (49.4 kg)   SpO2 98%   BMI 20.60 kg/m    GEN: Well nourished, well developed, in no acute distress HEENT: normal Neck: no JVD or masses Cardiac: RRR; 3/6 SEM. No rubs, or gallops. 2+ bilateral LE edema with erythema and blistering Respiratory:  clear to auscultation bilaterally, normal work of breathing GI: soft, nontender, nondistended, + BS MS: no deformity or atrophy Skin: warm and dry, no rash Neuro:  Alert and Oriented x 3, Strength and sensation are intact Psych: euthymic mood, full affect   Wt Readings from Last 3 Encounters:  05/27/19 109 lb (49.4 kg)  05/17/19 120 lb (54.4 kg)  04/12/19 120 lb (54.4 kg)      Studies/Labs Reviewed:   EKG:  EKG is ordered today.  The ekg ordered today demonstrates sinus   Recent Labs: 03/15/2019: BUN 20; Creatinine, Ser 0.73; Magnesium 2.1; Potassium 4.3; Sodium 141 03/29/2019: NT-Pro BNP 737   Lipid Panel No results found for: CHOL, TRIG, HDL, CHOLHDL, VLDL, LDLCALC, LDLDIRECT  Additional studies/ records that were reviewed today include:  Echo - no report avaible (Done at [facility])  ____________  Right:  - No evidence of superficial venous reflux seen in the right greater  saphenous vein.  - No evidence of superficial venous reflux seen in the right short  saphenous vein.  - Venous reflux is noted in the right common femoral vein.   Left:  - No evidence of superficial venous reflux seen in the  left greater  saphenous vein.  - No evidence of superficial venous reflux seen in the left short  saphenous vein.  - Color duplex evaluation of the left lower extremity shows there is  thrombus in the distal greater saphenous vein.  - Venous reflux is noted in the left common femoral vein.  - Bulging lower leg varicosities arise from a distal  thigh, anterolateral  incompetent perforating vein.   _________________  STS Score Procedure: Isolated AVR Risk of Mortality: 1.577% Renal Failure: 0.923% Permanent Stroke: 1.509% Prolonged Ventilation: 4.234% DSW Infection: 0.063% Reoperation: 3.422% Morbidity or Mortality: 7.750% Short Length of Stay: 45.396% Long Length of Stay: 3.300%   ASSESSMENT & PLAN:   Severe AS: she has severe stage C aortic stenosis with NYHA class I symptoms. She does not have symptoms of chest pain, shortness of breath or syncope. She denies fatigue. She is mostly limited by her LE edema with associated blistering and pain. Dr. Trula Slade did say there was some evidence of volume overload according his testing, so it's possible fixing her valve may improve her LE edema to some degree. I have encouraged her to keep wrapping her legs and wearing compression stockings. She is ready to proceed with TAVR work up. Will start with [facility]  I have reviewed the risks, indications, and alternatives to cardiac catheterization and possible angioplasty/stenting with the patient. Risks include but are not limited to bleeding, infection, vascular injury, stroke, myocardial infection, arrhythmia, kidney injury, radiation-related injury in the case of prolonged fluoroscopy use, emergency cardiac surgery, and death. The patient understands the risks of serious complication is low (123456).     Medication Adjustments/Labs and Tests Ordered: Current medicines are reviewed at length with the patient today.  Concerns regarding medicines are outlined above.  Medication changes, Labs and Tests  ordered today are listed in the Patient Instructions below. Patient Instructions  COVID SCREENING INFORMATION (5/17): You are scheduled for your drive-thru COVID screening on 06/07/2019 at  [facility] (old [facility]) [address] Stay in the RIGHT lane and proceed under the brick awning and tell them you are there for pre-procedure testing. Do NOT bring any pets with you to the testing site. You will need to go home after your screening and quarantine until your procedure.   CATHETERIZATION INSTRUCTIONS (5/19): You are scheduled for a Cardiac Catheterization on: Wednesday, 06/09/2019 with Dr. Burt Knack.  1. Please arrive at the [facility] (Main Entrance A) at [facility]: [address] [place], [place] [address] at: 8:00AM. (This time is two hours before your procedure to ensure your preparation). Free valet parking service is available. You are allowed ONE visitor in the waiting room during your procedure. Both you and your guest must wear masks. Special note: Every effort is made to have your procedure done on time. Please understand that emergencies sometimes delay scheduled procedures.  2. Diet: Do not eat solid foods after midnight.  You may have clear liquids until 5am upon the day of the procedure.  3. Labs: TODAY!  4. Medication instructions in preparation for your procedure:  1) HOLD LASIX the morning of your catheterization  2) HOLD HYDROCHLOROTHIAZIDE the morning of your cath  3) TAKE ASPIRIN 81 mg the morning of your cath  4) You may take your other medications as directed with sips of water  5. Plan for one night stay--bring personal belongings. 6. Bring a current list of your medications and current insurance cards. 7. You MUST have a responsible person to drive you home. 8. Someone MUST be with you the first 24 hours after you arrive home or your discharge will be delayed. 9. Please wear clothes that are easy to get on and off and wear  slip-on shoes.  Thank you for allowing [place] to care for you!   -- [facility]    Signed,  Angelena Form, PA-C  05/27/2019 4:29 PM    [facility] [place], [place], [place]  [address] Phone: [phone]; Fax: [phone]

## 2019-05-27 ENCOUNTER — Encounter: Payer: Self-pay | Admitting: Physician Assistant

## 2019-05-27 ENCOUNTER — Other Ambulatory Visit: Payer: Self-pay

## 2019-05-27 ENCOUNTER — Ambulatory Visit: Payer: PPO | Admitting: Physician Assistant

## 2019-05-27 VITALS — BP 122/58 | HR 82 | Ht 61.0 in | Wt 109.0 lb

## 2019-05-27 DIAGNOSIS — I350 Nonrheumatic aortic (valve) stenosis: Secondary | ICD-10-CM

## 2019-05-27 NOTE — Patient Instructions (Addendum)
COVID SCREENING INFORMATION (5/17): You are scheduled for your drive-thru COVID screening on 06/07/2019 at  [facility] (old [facility]) [address] Stay in the RIGHT lane and proceed under the brick awning and tell them you are there for pre-procedure testing. Do NOT bring any pets with you to the testing site. You will need to go home after your screening and quarantine until your procedure.   CATHETERIZATION INSTRUCTIONS (5/19): You are scheduled for a Cardiac Catheterization on: Wednesday, 06/09/2019 with Dr. Burt Knack.  1. Please arrive at the [facility] (Main Entrance A) at [facility]: [address] [place], [place] [address] at: 8:00AM. (This time is two hours before your procedure to ensure your preparation). Free valet parking service is available. You are allowed ONE visitor in the waiting room during your procedure. Both you and your guest must wear masks. Special note: Every effort is made to have your procedure done on time. Please understand that emergencies sometimes delay scheduled procedures.  2. Diet: Do not eat solid foods after midnight.  You may have clear liquids until 5am upon the day of the procedure.  3. Labs: TODAY!  4. Medication instructions in preparation for your procedure:  1) HOLD LASIX the morning of your catheterization  2) HOLD HYDROCHLOROTHIAZIDE the morning of your cath  3) TAKE ASPIRIN 81 mg the morning of your cath  4) You may take your other medications as directed with sips of water  5. Plan for one night stay--bring personal belongings. 6. Bring a current list of your medications and current insurance cards. 7. You MUST have a responsible person to drive you home. 8. Someone MUST be with you the first 24 hours after you arrive home or your discharge will be delayed. 9. Please wear clothes that are easy to get on and off and wear slip-on shoes.  Thank you for allowing [place] to care for you!   -- [facility]

## 2019-05-28 LAB — CBC WITH DIFFERENTIAL/PLATELET
Basophils Absolute: 0 10*3/uL (ref 0.0–0.2)
Basos: 1 %
EOS (ABSOLUTE): 0.1 10*3/uL (ref 0.0–0.4)
Eos: 1 %
Hematocrit: 41.6 % (ref 34.0–46.6)
Hemoglobin: 13 g/dL (ref 11.1–15.9)
Immature Grans (Abs): 0 10*3/uL (ref 0.0–0.1)
Immature Granulocytes: 0 %
Lymphocytes Absolute: 0.7 10*3/uL (ref 0.7–3.1)
Lymphs: 13 %
MCH: 29.4 pg (ref 26.6–33.0)
MCHC: 31.3 g/dL — ABNORMAL LOW (ref 31.5–35.7)
MCV: 94 fL (ref 79–97)
Monocytes Absolute: 0.4 10*3/uL (ref 0.1–0.9)
Monocytes: 8 %
Neutrophils Absolute: 4.2 10*3/uL (ref 1.4–7.0)
Neutrophils: 77 %
Platelets: 152 10*3/uL (ref 150–450)
RBC: 4.42 x10E6/uL (ref 3.77–5.28)
RDW: 13.1 % (ref 11.7–15.4)
WBC: 5.4 10*3/uL (ref 3.4–10.8)

## 2019-05-28 LAB — BASIC METABOLIC PANEL
BUN/Creatinine Ratio: 16 (ref 12–28)
BUN: 12 mg/dL (ref 8–27)
CO2: 24 mmol/L (ref 20–29)
Calcium: 9.3 mg/dL (ref 8.7–10.3)
Chloride: 105 mmol/L (ref 96–106)
Creatinine, Ser: 0.75 mg/dL (ref 0.57–1.00)
GFR calc Af Amer: 90 mL/min/{1.73_m2} (ref >59)
GFR calc non Af Amer: 78 mL/min/{1.73_m2} (ref >59)
Glucose: 76 mg/dL (ref 65–99)
Potassium: 4.8 mmol/L (ref 3.5–5.2)
Sodium: 142 mmol/L (ref 134–144)

## 2019-06-07 ENCOUNTER — Other Ambulatory Visit (HOSPITAL_COMMUNITY)
Admission: RE | Admit: 2019-06-07 | Discharge: 2019-06-07 | Disposition: A | Discharge status: Home / Self Care | Payer: PPO | Source: Ambulatory Visit | Attending: Cardiovascular Disease | Admitting: Cardiovascular Disease

## 2019-06-07 DIAGNOSIS — Z01812 Encounter for preprocedural laboratory examination: Secondary | ICD-10-CM | POA: Insufficient documentation

## 2019-06-07 DIAGNOSIS — Z20822 Contact with and (suspected) exposure to covid-19: Secondary | ICD-10-CM | POA: Insufficient documentation

## 2019-06-07 LAB — SARS CORONAVIRUS 2 (TAT 6-24 HRS): SARS Coronavirus 2: NEGATIVE

## 2019-06-08 ENCOUNTER — Telehealth: Payer: Self-pay | Admitting: *Deleted

## 2019-06-08 NOTE — Telephone Encounter (Signed)
No answer

## 2019-06-08 NOTE — Telephone Encounter (Signed)
No answer at home phone number.

## 2019-06-08 NOTE — Telephone Encounter (Signed)
Pt contacted pre-catheterization scheduled at [facility] for: Wednesday Jun 09, 2019 10 AM Verified arrival time and place: [facility]) at: 8 AM   No solid food after midnight prior to cath, clear liquids until 5 AM day of procedure.  Hold: Lasix/KCl-AM of procedure HCTZ-AM of procedure  AM meds can be  taken pre-cath with sip of water including: ASA 81 mg   Confirmed patient has responsible adult to drive home post procedure and observe 24 hours after arriving home:   You are allowed ONE visitor in the waiting room during your procedure. Both you and your visitor must wear masks.      COVID-19 Pre-Screening Questions:  . In the past 7 to 10 days have you had a cough,  shortness of breath, headache, congestion, fever (100 or greater) body aches, chills, sore throat, or sudden loss of taste or sense of smell? . Have you been around anyone with known Covid 19 in the past 7 to 10 days? . Have you been around anyone who is awaiting Covid 19 test results in the past 7 to 10 days? . Have you been around anyone who has mentioned symptoms of Covid 19 within the past 7 to 10 days?    Call to patient to review procedure instructions, no answer at either number.

## 2019-06-09 ENCOUNTER — Encounter (HOSPITAL_COMMUNITY)
Admission: RE | Disposition: A | Discharge status: Home / Self Care | Payer: Self-pay | Source: Home / Self Care | Attending: Cardiovascular Disease

## 2019-06-09 ENCOUNTER — Other Ambulatory Visit: Payer: Self-pay

## 2019-06-09 ENCOUNTER — Ambulatory Visit (HOSPITAL_COMMUNITY)
Admission: RE | Admit: 2019-06-09 | Discharge: 2019-06-09 | Disposition: A | Discharge status: Home / Self Care | Payer: PPO | Attending: Cardiovascular Disease | Admitting: Cardiovascular Disease

## 2019-06-09 DIAGNOSIS — R600 Localized edema: Secondary | ICD-10-CM | POA: Insufficient documentation

## 2019-06-09 DIAGNOSIS — Z853 Personal history of malignant neoplasm of breast: Secondary | ICD-10-CM | POA: Insufficient documentation

## 2019-06-09 DIAGNOSIS — M79605 Pain in left leg: Secondary | ICD-10-CM

## 2019-06-09 DIAGNOSIS — Z79899 Other long term (current) drug therapy: Secondary | ICD-10-CM | POA: Diagnosis not present

## 2019-06-09 DIAGNOSIS — Z9013 Acquired absence of bilateral breasts and nipples: Secondary | ICD-10-CM | POA: Diagnosis not present

## 2019-06-09 DIAGNOSIS — E039 Hypothyroidism, unspecified: Secondary | ICD-10-CM | POA: Diagnosis not present

## 2019-06-09 DIAGNOSIS — I350 Nonrheumatic aortic (valve) stenosis: Secondary | ICD-10-CM

## 2019-06-09 DIAGNOSIS — I872 Venous insufficiency (chronic) (peripheral): Secondary | ICD-10-CM

## 2019-06-09 DIAGNOSIS — Z79890 Hormone replacement therapy (postmenopausal): Secondary | ICD-10-CM | POA: Diagnosis not present

## 2019-06-09 HISTORY — PX: RIGHT/LEFT HEART CATH AND CORONARY ANGIOGRAPHY: CATH118266

## 2019-06-09 LAB — POCT I-STAT EG7
Acid-Base Excess: 1 mmol/L (ref 0.0–2.0)
Acid-Base Excess: 1 mmol/L (ref 0.0–2.0)
Acid-Base Excess: 1 mmol/L (ref 0.0–2.0)
Acid-Base Excess: 1 mmol/L (ref 0.0–2.0)
Acid-Base Excess: 1 mmol/L (ref 0.0–2.0)
Acid-Base Excess: 2 mmol/L (ref 0.0–2.0)
Acid-Base Excess: 3 mmol/L — ABNORMAL HIGH (ref 0.0–2.0)
Bicarbonate: 26.4 mmol/L (ref 20.0–28.0)
Bicarbonate: 27 mmol/L (ref 20.0–28.0)
Bicarbonate: 27.6 mmol/L (ref 20.0–28.0)
Bicarbonate: 27.8 mmol/L (ref 20.0–28.0)
Bicarbonate: 26.7 mmol/L (ref 20.0–28.0)
Bicarbonate: 28.8 mmol/L — ABNORMAL HIGH (ref 20.0–28.0)
Bicarbonate: 29.5 mmol/L — ABNORMAL HIGH (ref 20.0–28.0)
Calcium, Ion: 1.25 mmol/L (ref 1.15–1.40)
Calcium, Ion: 1.25 mmol/L (ref 1.15–1.40)
Calcium, Ion: 1.28 mmol/L (ref 1.15–1.40)
Calcium, Ion: 1.31 mmol/L (ref 1.15–1.40)
Calcium, Ion: 1.28 mmol/L (ref 1.15–1.40)
Calcium, Ion: 1.3 mmol/L (ref 1.15–1.40)
Calcium, Ion: 1.33 mmol/L (ref 1.15–1.40)
HCT: 37 % (ref 36.0–46.0)
HCT: 37 % (ref 36.0–46.0)
HCT: 38 % (ref 36.0–46.0)
HCT: 38 % (ref 36.0–46.0)
HCT: 37 % (ref 36.0–46.0)
HCT: 37 % (ref 36.0–46.0)
HCT: 39 % (ref 36.0–46.0)
Hemoglobin: 12.6 g/dL (ref 12.0–15.0)
Hemoglobin: 12.6 g/dL (ref 12.0–15.0)
Hemoglobin: 12.9 g/dL (ref 12.0–15.0)
Hemoglobin: 12.9 g/dL (ref 12.0–15.0)
Hemoglobin: 12.6 g/dL (ref 12.0–15.0)
Hemoglobin: 12.6 g/dL (ref 12.0–15.0)
Hemoglobin: 13.3 g/dL (ref 12.0–15.0)
O2 Saturation: 68 %
O2 Saturation: 84 %
O2 Saturation: 90 %
O2 Saturation: 92 %
O2 Saturation: 49 %
O2 Saturation: 60 %
O2 Saturation: 86 %
Potassium: 3.7 mmol/L (ref 3.5–5.1)
Potassium: 3.7 mmol/L (ref 3.5–5.1)
Potassium: 3.7 mmol/L (ref 3.5–5.1)
Potassium: 3.8 mmol/L (ref 3.5–5.1)
Potassium: 3.7 mmol/L (ref 3.5–5.1)
Potassium: 3.8 mmol/L (ref 3.5–5.1)
Potassium: 3.9 mmol/L (ref 3.5–5.1)
Sodium: 142 mmol/L (ref 135–145)
Sodium: 142 mmol/L (ref 135–145)
Sodium: 143 mmol/L (ref 135–145)
Sodium: 144 mmol/L (ref 135–145)
Sodium: 143 mmol/L (ref 135–145)
Sodium: 143 mmol/L (ref 135–145)
Sodium: 143 mmol/L (ref 135–145)
TCO2: 28 mmol/L (ref 22–32)
TCO2: 28 mmol/L (ref 22–32)
TCO2: 29 mmol/L (ref 22–32)
TCO2: 29 mmol/L (ref 22–32)
TCO2: 28 mmol/L (ref 22–32)
TCO2: 30 mmol/L (ref 22–32)
TCO2: 31 mmol/L (ref 22–32)
pCO2, Ven: 46.3 mmHg (ref 44.0–60.0)
pCO2, Ven: 47.1 mmHg (ref 44.0–60.0)
pCO2, Ven: 51.2 mmHg (ref 44.0–60.0)
pCO2, Ven: 51.9 mmHg (ref 44.0–60.0)
pCO2, Ven: 47.7 mmHg (ref 44.0–60.0)
pCO2, Ven: 52.3 mmHg (ref 44.0–60.0)
pCO2, Ven: 53.8 mmHg (ref 44.0–60.0)
pH, Ven: 7.336 (ref 7.250–7.430)
pH, Ven: 7.34 (ref 7.250–7.430)
pH, Ven: 7.364 (ref 7.250–7.430)
pH, Ven: 7.367 (ref 7.250–7.430)
pH, Ven: 7.347 (ref 7.250–7.430)
pH, Ven: 7.349 (ref 7.250–7.430)
pH, Ven: 7.357 (ref 7.250–7.430)
pO2, Ven: 38 mmHg (ref 32.0–45.0)
pO2, Ven: 50 mmHg — ABNORMAL HIGH (ref 32.0–45.0)
pO2, Ven: 62 mmHg — ABNORMAL HIGH (ref 32.0–45.0)
pO2, Ven: 66 mmHg — ABNORMAL HIGH (ref 32.0–45.0)
pO2, Ven: 28 mmHg — CL (ref 32.0–45.0)
pO2, Ven: 34 mmHg (ref 32.0–45.0)
pO2, Ven: 54 mmHg — ABNORMAL HIGH (ref 32.0–45.0)

## 2019-06-09 LAB — POCT I-STAT 7, (LYTES, BLD GAS, ICA,H+H)
Acid-Base Excess: 1 mmol/L (ref 0.0–2.0)
Bicarbonate: 27.4 mmol/L (ref 20.0–28.0)
Calcium, Ion: 1.3 mmol/L (ref 1.15–1.40)
HCT: 38 % (ref 36.0–46.0)
Hemoglobin: 12.9 g/dL (ref 12.0–15.0)
O2 Saturation: 100 %
Potassium: 3.8 mmol/L (ref 3.5–5.1)
Sodium: 142 mmol/L (ref 135–145)
TCO2: 29 mmol/L (ref 22–32)
pCO2 arterial: 48.6 mmHg — ABNORMAL HIGH (ref 32.0–48.0)
pH, Arterial: 7.36 (ref 7.350–7.450)
pO2, Arterial: 232 mmHg — ABNORMAL HIGH (ref 83.0–108.0)

## 2019-06-09 SURGERY — RIGHT/LEFT HEART CATH AND CORONARY ANGIOGRAPHY
Anesthesia: LOCAL

## 2019-06-09 MED ORDER — MIDAZOLAM HCL 2 MG/2ML IJ SOLN
INTRAMUSCULAR | Status: DC | PRN
  Administered 2019-06-09: 1 mg via INTRAVENOUS

## 2019-06-09 MED ORDER — METOPROLOL TARTRATE 50 MG PO TABS
ORAL_TABLET | ORAL | 0 refills | Status: DC
Start: 2019-06-09 — End: 2019-08-02

## 2019-06-09 MED ORDER — SODIUM CHLORIDE 0.9 % WEIGHT BASED INFUSION: 1.0000 mL/kg/h | INTRAVENOUS | Status: DC

## 2019-06-09 MED ORDER — MIDAZOLAM HCL 2 MG/2ML IJ SOLN
INTRAMUSCULAR | Status: AC
  Filled 2019-06-09: qty 2

## 2019-06-09 MED ORDER — ONDANSETRON HCL 4 MG/2ML IJ SOLN: 4.0000 mg | Freq: Four times a day (QID) | INTRAMUSCULAR | Status: DC | PRN

## 2019-06-09 MED ORDER — SODIUM CHLORIDE 0.9 % IV SOLN: 250.0000 mL | INTRAVENOUS | Status: DC | PRN

## 2019-06-09 MED ORDER — ASPIRIN 81 MG PO CHEW: 81.0000 mg | CHEWABLE_TABLET | ORAL | Status: DC

## 2019-06-09 MED ORDER — SODIUM CHLORIDE 0.9% FLUSH: 3.0000 mL | INTRAVENOUS | Status: DC | PRN

## 2019-06-09 MED ORDER — TRAMADOL HCL 50 MG PO TABS
ORAL_TABLET | ORAL | Status: AC
  Administered 2019-06-09: 50 mg via ORAL
  Filled 2019-06-09: qty 1

## 2019-06-09 MED ORDER — SODIUM CHLORIDE 0.9 % IV SOLN: INTRAVENOUS | Status: DC

## 2019-06-09 MED ORDER — SODIUM CHLORIDE 0.9% FLUSH: 3.0000 mL | Freq: Two times a day (BID) | INTRAVENOUS | Status: DC

## 2019-06-09 MED ORDER — LIDOCAINE HCL (PF) 1 % IJ SOLN
INTRAMUSCULAR | Status: AC
  Filled 2019-06-09: qty 30

## 2019-06-09 MED ORDER — FENTANYL CITRATE (PF) 100 MCG/2ML IJ SOLN
INTRAMUSCULAR | Status: DC | PRN
  Administered 2019-06-09: 25 ug via INTRAVENOUS

## 2019-06-09 MED ORDER — IOHEXOL 350 MG/ML SOLN
INTRAVENOUS | Status: DC | PRN
  Administered 2019-06-09: 40 mL

## 2019-06-09 MED ORDER — HEPARIN (PORCINE) IN NACL 1000-0.9 UT/500ML-% IV SOLN
INTRAVENOUS | Status: DC | PRN
  Administered 2019-06-09 (×2): 500 mL

## 2019-06-09 MED ORDER — FENTANYL CITRATE (PF) 100 MCG/2ML IJ SOLN
INTRAMUSCULAR | Status: AC
  Filled 2019-06-09: qty 2

## 2019-06-09 MED ORDER — HEPARIN (PORCINE) IN NACL 1000-0.9 UT/500ML-% IV SOLN
INTRAVENOUS | Status: AC
  Filled 2019-06-09: qty 1000

## 2019-06-09 MED ORDER — ACETAMINOPHEN 325 MG PO TABS: 650.0000 mg | ORAL_TABLET | ORAL | Status: DC | PRN

## 2019-06-09 MED ORDER — LABETALOL HCL 5 MG/ML IV SOLN: 10.0000 mg | INTRAVENOUS | Status: DC | PRN

## 2019-06-09 MED ORDER — TRAMADOL HCL 50 MG PO TABS: 50.0000 mg | ORAL_TABLET | Freq: Once | ORAL | Status: AC

## 2019-06-09 MED ORDER — LIDOCAINE HCL (PF) 1 % IJ SOLN
INTRAMUSCULAR | Status: DC | PRN
  Administered 2019-06-09: 10 mL

## 2019-06-09 MED ORDER — HYDRALAZINE HCL 20 MG/ML IJ SOLN: 10.0000 mg | INTRAMUSCULAR | Status: DC | PRN

## 2019-06-09 MED ORDER — SODIUM CHLORIDE 0.9 % WEIGHT BASED INFUSION
3.0000 mL/kg/h | INTRAVENOUS | Status: AC
  Administered 2019-06-09: 3 mL/kg/h via INTRAVENOUS

## 2019-06-09 SURGICAL SUPPLY — 13 items
CATH EXPO 5F MPA-1 (CATHETERS) ×1 IMPLANT
CATH INFINITI 5FR MULTPACK ANG (CATHETERS) ×1 IMPLANT
CATH SWAN GANZ 7F STRAIGHT (CATHETERS) ×1 IMPLANT
CLOSURE MYNX CONTROL 5F (Vascular Products) ×1 IMPLANT
KIT HEART LEFT (KITS) ×2 IMPLANT
PACK CARDIAC CATHETERIZATION (CUSTOM PROCEDURE TRAY) ×2 IMPLANT
SHEATH PINNACLE 5F 10CM (SHEATH) ×1 IMPLANT
SHEATH PINNACLE 7F 10CM (SHEATH) ×1 IMPLANT
SHEATH PROBE COVER 6X72 (BAG) ×1 IMPLANT
TRANSDUCER W/STOPCOCK (MISCELLANEOUS) ×2 IMPLANT
TUBING CIL FLEX 10 FLL-RA (TUBING) ×1 IMPLANT
WIRE EMERALD 3MM-J .035X150CM (WIRE) ×1 IMPLANT
WIRE EMERALD ST .035X150CM (WIRE) ×1 IMPLANT

## 2019-06-09 NOTE — Interval H&P Note (Signed)
History and Physical Interval Note:  06/09/2019 9:59 AM  Denise Bailey  has presented today for surgery, with the diagnosis of aortic stenosis.  The various methods of treatment have been discussed with the patient and family. After consideration of risks, benefits and other options for treatment, the patient has consented to  Procedure(s): RIGHT/LEFT HEART CATH AND CORONARY ANGIOGRAPHY (N/A) as a surgical intervention.  The patient's history has been reviewed, patient examined, no change in status, stable for surgery.  I have reviewed the patient's chart and labs.  Questions were answered to the patient's satisfaction.     [name]

## 2019-06-09 NOTE — Discharge Instructions (Signed)
Femoral Site Care This sheet gives you information about how to care for yourself after your procedure. Your health care provider may also give you more specific instructions. If you have problems or questions, contact your health care provider. What can I expect after the procedure? After the procedure, it is common to have:  Bruising that usually fades within 1-2 weeks.  Tenderness at the site. Follow these instructions at home: Wound care  Follow instructions from your health care provider about how to take care of your insertion site. Make sure you: ? Wash your hands with soap and water before you change your bandage (dressing). If soap and water are not available, use hand sanitizer. ? Change your dressing as told by your health care provider. ? Leave stitches (sutures), skin glue, or adhesive strips in place. These skin closures may need to stay in place for 2 weeks or longer. If adhesive strip edges start to loosen and curl up, you may trim the loose edges. Do not remove adhesive strips completely unless your health care provider tells you to do that.  Do not take baths, swim, or use a hot tub until your health care provider approves.  You may shower 24-48 hours after the procedure or as told by your health care provider. ? Gently wash the site with plain soap and water. ? Pat the area dry with a clean towel. ? Do not rub the site. This may cause bleeding.  Do not apply powder or lotion to the site. Keep the site clean and dry.  Check your femoral site every day for signs of infection. Check for: ? Redness, swelling, or pain. ? Fluid or blood. ? Warmth. ? Pus or a bad smell. Activity  For the first 2-3 days after your procedure, or as long as directed: ? Avoid climbing stairs as much as possible. ? Do not squat.  Do not lift anything that is heavier than 10 lb (4.5 kg), or the limit that you are told, until your health care provider says that it is safe.  Rest as  directed. ? Avoid sitting for a long time without moving. Get up to take short walks every 1-2 hours.  Do not drive for 24 hours if you were given a medicine to help you relax (sedative). General instructions  Take over-the-counter and prescription medicines only as told by your health care provider.  Keep all follow-up visits as told by your health care provider. This is important. Contact a health care provider if you have:  A fever or chills.  You have redness, swelling, or pain around your insertion site. Get help right away if:  The catheter insertion area swells very fast.  You pass out.  You suddenly start to sweat or your skin gets clammy.  The catheter insertion area is bleeding, and the bleeding does not stop when you hold steady pressure on the area.  The area near or just beyond the catheter insertion site becomes pale, cool, tingly, or numb. These symptoms may represent a serious problem that is an emergency. Do not wait to see if the symptoms will go away. Get medical help right away. Call your local emergency services (911 in the [place] Do not drive yourself to the hospital. Summary  After the procedure, it is common to have bruising that usually fades within 1-2 weeks.  Check your femoral site every day for signs of infection.  Do not lift anything that is heavier than 10 lb (4.5 kg), or the   limit that you are told, until your health care provider says that it is safe. This information is not intended to replace advice given to you by your health care provider. Make sure you discuss any questions you have with your health care provider. Document Revised: 01/20/2017 Document Reviewed: 01/20/2017 Elsevier Patient Education  2020 [employer]  

## 2019-06-09 NOTE — Telephone Encounter (Signed)
Pt currently at hospital for procedure. 

## 2019-06-09 NOTE — Progress Notes (Signed)
Client up to bathroom and tolerated well; right groin stable, no bleeding or hematoma

## 2019-06-14 ENCOUNTER — Ambulatory Visit (HOSPITAL_COMMUNITY)
Admission: RE | Admit: 2019-06-14 | Discharge: 2019-06-14 | Disposition: A | Discharge status: Home / Self Care | Payer: PPO | Attending: Physician Assistant | Admitting: Physician Assistant

## 2019-06-14 ENCOUNTER — Ambulatory Visit (HOSPITAL_COMMUNITY)
Admission: RE | Admit: 2019-06-14 | Discharge: 2019-06-14 | Disposition: A | Discharge status: Home / Self Care | Payer: PPO | Source: Home / Self Care | Attending: Physician Assistant | Admitting: Physician Assistant

## 2019-06-14 ENCOUNTER — Other Ambulatory Visit: Payer: Self-pay

## 2019-06-14 ENCOUNTER — Ambulatory Visit: Payer: PPO | Attending: Cardiovascular Disease | Admitting: Physical Therapy

## 2019-06-14 ENCOUNTER — Encounter: Payer: Self-pay | Admitting: Physical Therapy

## 2019-06-14 DIAGNOSIS — M6281 Muscle weakness (generalized): Secondary | ICD-10-CM | POA: Insufficient documentation

## 2019-06-14 DIAGNOSIS — I350 Nonrheumatic aortic (valve) stenosis: Secondary | ICD-10-CM | POA: Diagnosis not present

## 2019-06-14 DIAGNOSIS — R2689 Other abnormalities of gait and mobility: Secondary | ICD-10-CM | POA: Insufficient documentation

## 2019-06-14 DIAGNOSIS — Z01811 Encounter for preprocedural respiratory examination: Secondary | ICD-10-CM | POA: Diagnosis not present

## 2019-06-14 DIAGNOSIS — Z01818 Encounter for other preprocedural examination: Secondary | ICD-10-CM | POA: Diagnosis not present

## 2019-06-14 DIAGNOSIS — I700 Atherosclerosis of aorta: Secondary | ICD-10-CM | POA: Diagnosis not present

## 2019-06-14 MED ORDER — IOHEXOL 350 MG/ML SOLN
100.0000 mL | Freq: Once | INTRAVENOUS | Status: AC | PRN
  Administered 2019-06-14: 100 mL via INTRAVENOUS

## 2019-06-14 NOTE — Therapy (Signed)
[place], [place], [address] Phone: [phone]   Fax:  [phone]  Physical Therapy Evaluation  Patient Details  Name: Denise Bailey MRN: [id number] Date of Birth: [birth year] Referring Provider (PT): [name] MD   Encounter Date: 06/14/2019  PT End of Session - 06/14/19 0854    Visit Number  1    Number of Visits  1    Date for PT Re-Evaluation  06/14/19    PT Start Time  0843    PT Stop Time  0918    PT Time Calculation (min)  35 min    Activity Tolerance  Patient tolerated treatment well    Behavior During Therapy  [facility] for tasks assessed/performed       Past Medical History:  Diagnosis Date  . Bilateral leg edema 03/15/2019  . Breast cancer ([place])   . Hypothyroidism   . Severe aortic stenosis 03/15/2019    Past Surgical History:  Procedure Laterality Date  . MASTECTOMY    . RIGHT/LEFT HEART CATH AND CORONARY ANGIOGRAPHY N/A 06/09/2019   Procedure: RIGHT/LEFT HEART CATH AND CORONARY ANGIOGRAPHY;  Surgeon: [name], MD;  Location: [facility];  Service: Cardiovascular;  Laterality: N/A;    There were no vitals filed for this visit.   Subjective Assessment - 06/14/19 0851    Subjective  pt is a 74 y.o F with having issues with LE swelling with blistering starting 6 months ago with the blistering starting in the last 3 months. she reports fatigue that occurs more in the legs with standing/ walking. she denies any SOB, chest pain or syncopy.    Patient Stated Goals  to fix heart    Currently in Pain?  Yes    Pain Score  5     Pain Location  Leg    Pain Orientation  Right;Left    Aggravating Factors   standing/ walking    Pain Relieving Factors  sitting and resting         [facility] PT Assessment - 06/14/19 0001      Assessment   Medical Diagnosis  Severe Aortic Stenosis    Referring Provider (PT)  [name] MD    Onset Date/Surgical Date  --   6 months   Hand  Dominance  Right      Precautions   Precautions  None      Restrictions   Weight Bearing Restrictions  No      Balance Screen   Has the patient fallen in the past 6 months  No    Has the patient had a decrease in activity level because of a fear of falling?   No    Is the patient reluctant to leave their home because of a fear of falling?   No      Home Film/video editor residence    Living Arrangements  Spouse/significant other    Available Help at Discharge  Family    Type of [place] to enter    Entrance Stairs-Number of Steps  4    Entrance Stairs-Rails  Can reach both    [place]  One level    [place] - 2 wheels;Cane - single point      Prior Function   Level of Independence  Needs assistance with ADLs;Requires assistive device for independence    Vocation  Retired  Cognition   Overall Cognitive Status  Within Functional Limits for tasks assessed      ROM / Strength   AROM / PROM / Strength  Strength;AROM      AROM   Overall AROM   Within functional limits for tasks performed      Strength   Overall Strength Comments  bil UE 4/5 strength, bil LE strength 4-/5    Strength Assessment Site  Hand    Right Hand Grip (lbs)  56    Left Hand Grip (lbs)  45      Ambulation/Gait   Assistive device  Straight cane    Gait Pattern  Step-to pattern;Decreased stride length;Antalgic;Trendelenburg       [facility] Pre-Surgical Assessment - 06/14/19 0001    5 Meter Walk Test- trial 1  21 sec    5 Meter Walk Test- trial 2  20 sec.     5 Meter Walk Test- trial 3  21 sec.    5 meter walk test average  20.67 sec    4 Stage Balance Test tolerated for:   0 sec.    4 Stage Balance Test Position  1    comment  difficulty getting into position    Comment  unable to perform    ADL/IADL Independent with:  Meal prep;Dressing;Finances    ADL/IADL Needs Assistance with:  Bathing;Yard work    ADL/IADL Therapist, sports  Index  Midly frail    6 Minute Walk- Baseline  yes    BP (mmHg)  115/74    HR (bpm)  65    02 Sat (%RA)  92 %    Modified Borg Scale for Dyspnea  0- Nothing at all    Perceived Rate of Exertion (Borg)  6-    6 Minute Walk Post Test  yes    BP (mmHg)  125/71    HR (bpm)  90    02 Sat (%RA)  84 %    Modified Borg Scale for Dyspnea  0- Nothing at all    Perceived Rate of Exertion (Borg)  11- Fairly light    Aerobic Endurance Distance Walked  171    Endurance additional comments  pt is 75% limited compared to age related norm                Objective measurements completed on examination: See above findings.              PT Education - 06/14/19 [phone]    Education Details  educated about frailty score noting she is at a high fall risk and discussed benefits of taking time avoiding quick motions for maximum safety.    Person(s) Educated  Patient    Methods  Explanation;Verbal cues    Comprehension  Verbalized understanding;Verbal cues required                  Plan - 06/14/19 0925    Clinical Impression Statement  see assessment in note    Stability/Clinical Decision Making  Stable/Uncomplicated    Clinical Decision Making  Low    PT Frequency  One time visit    PT Next Visit Plan  Pre-TAVR evaluation    Consulted and Agree with Plan of Care  Patient        Clinical Impression Statement: Pt is a 74 yo F presenting to OP PT for evaluation prior to possible TAVR surgery due to severe aortic stenosis. Pt reports onset of LE swelling/ blistering and general weakness approximately  6 months ago. Symptoms are limiting endurance and walking speed. Pt presents with functional ROM and mild weakness noted in bil LE, balance and is assessed as a high fall risk at high fall risk 4 stage balance test, poor walking speed and  aerobic endurance per 6 minute walk test. Pt ambulated 171 feet without requiring seated rest break. At end of testing patient's HR was 90 bpm  and O2 was 84 on room air. Pt reported 0/10 shortness of breath on modified scale for dyspnea. Pt ambulated a total of 171 feet in 6 minute walk. General fatigue, BIL LE pain increased significantly with 6 minute walk test. Based on the Short Physical Performance Battery, patient has a frailty rating of 1/12 with </= 5/12 considered frail.    Patient demonstrated the following deficits and impairments:     Visit Diagnosis: Muscle weakness (generalized)  Other abnormalities of gait and mobility     Problem List Patient Active Problem List   Diagnosis Date Noted  . Venous insufficiency 04/12/2019  . Severe aortic stenosis 03/15/2019  . Bilateral leg edema 03/15/2019    Starr Lake PT, DPT, LAT, ATC  06/14/19  9:30 AM      [facility] [address] [place], [place], [address] Phone: [phone]   Fax:  [phone]  Name: Denise Bailey MRN: [id number] Date of Birth: [birth year]

## 2019-06-15 ENCOUNTER — Other Ambulatory Visit: Payer: Self-pay | Admitting: Physician Assistant

## 2019-06-15 ENCOUNTER — Encounter: Payer: PPO | Admitting: Thoracic Surgery (Cardiothoracic Vascular Surgery)

## 2019-06-15 ENCOUNTER — Telehealth: Payer: Self-pay | Admitting: Cardiovascular Disease

## 2019-06-15 ENCOUNTER — Encounter: Payer: Self-pay | Admitting: Thoracic Surgery (Cardiothoracic Vascular Surgery)

## 2019-06-15 ENCOUNTER — Encounter: Payer: Self-pay | Admitting: Physician Assistant

## 2019-06-15 ENCOUNTER — Institutional Professional Consult (permissible substitution): Payer: PPO | Admitting: Thoracic Surgery (Cardiothoracic Vascular Surgery)

## 2019-06-15 VITALS — BP 128/76 | HR 95 | Temp 97.9°F | Resp 20 | Ht 61.0 in | Wt 113.0 lb

## 2019-06-15 DIAGNOSIS — Q211 Atrial septal defect, unspecified: Secondary | ICD-10-CM

## 2019-06-15 DIAGNOSIS — I350 Nonrheumatic aortic (valve) stenosis: Secondary | ICD-10-CM | POA: Diagnosis not present

## 2019-06-15 NOTE — Patient Instructions (Addendum)
Continue all previous medications without any changes at this time  Keep your legs elevated as much as possible whenever you are not up on your feet and walking  Try to use compression stockings every day as prescribed by Dr Trula Slade and your primary care physician

## 2019-06-15 NOTE — Progress Notes (Signed)
HEART AND [place] SURGERY CONSULTATION REPORT  Referring Provider is [name], MD Primary Cardiologist is Berniece Salines, DO PCP is Penelope Coop, FNP  Chief Complaint  Patient presents with  . Aortic Stenosis    Surgical eval for TAVR, review all testing/studies.    HPI:  Patient is a 74 year old female with history of aortic stenosis, breast cancer status post mastectomy, hypothyroidism, and severe chronic venous insufficiency with chronic lower extremity edema who has been referred for surgical consultation to discuss treatment options for management of severe symptomatic aortic stenosis.  Patient recently presented with worsening symptoms of chronic lower extremity edema with painful ulcerations in her legs.  She was found to have a heart murmur and echocardiogram performed at [facility] on March 02, 2019 revealed severe aortic stenosis and normal left ventricular systolic function.  By report there was normal left ventricular systolic function with ejection fraction estimated 55 to 60%.  Peak velocity across the aortic valve was reported 4.1 m/s corresponding to mean transvalvular gradient estimated 48 mmHg.  There was mild to moderate tricuspid regurgitation present with normal right ventricular size and systolic function.  She was initially evaluated by Dr. Harriet Masson and subsequently referred to Dr. Burt Knack.  Initially the patient did not wish to proceed with further diagnostic work-up for management of her aortic stenosis because of her ongoing symptoms of severe pain in both legs.  She was seen in consultation by Dr. Trula Slade at [facility] who recommended compression stockings for both lower legs.  She was seen in follow-up by Nell Range earlier this month and subsequently underwent diagnostic cardiac catheterization by Dr. Burt Knack on Jun 09, 2019.  Catheterization revealed normal coronary artery  anatomy with no significant coronary artery disease.  There was severe aortic stenosis.  Mean transvalvular gradient was measured 38 mmHg at catheterization corresponding to valve area calculated 1.0 cm.  There was mild pulmonary hypertension.  There was also elevated oxygen saturation in the right heart consistent with possible left to right shunt with Qp/Qs calculated 1.7:1.0 suggestive of arterial venous mixing.  CT angiography was performed and the patient referred for surgical consultation.  Patient is married and lives in the country [place] with her husband.  She is accompanied by a close friend for her office consultation visit today.  The patient complains that she has been very limited for the past 6 to 8 months because of pain in both legs related to swelling and blistering.  She denies symptoms of exertional shortness of breath or chest pain.  She denies resting shortness of breath, PND, orthopnea, dizzy spells, or syncope.  She admits to chronic fatigue but her biggest complaint is that of her severe swelling in both lower legs.  She has not been wearing compression stockings.  She has been followed by her primary care physician and she has been applying iodine to the blisters on her lower legs.  She denies fevers or chills.  Past Medical History:  Diagnosis Date  . Bilateral leg edema 03/15/2019  . Breast cancer ([place])   . Hypothyroidism   . Severe aortic stenosis 03/15/2019    Past Surgical History:  Procedure Laterality Date  . MASTECTOMY    . RIGHT/LEFT HEART CATH AND CORONARY ANGIOGRAPHY N/A 06/09/2019   Procedure: RIGHT/LEFT HEART CATH AND CORONARY ANGIOGRAPHY;  Surgeon: [name], MD;  Location: [place] [facility];  Service: Cardiovascular;  Laterality: N/A;    History reviewed.  No pertinent family history.  Social History   Socioeconomic History  . Marital status: Married    Spouse name: Not on file  . Number of children: Not on file  . Years of education:  Not on file  . Highest education level: Not on file  Occupational History  . Not on file  Tobacco Use  . Smoking status: Never Smoker  . Smokeless tobacco: Never Used  Substance and Sexual Activity  . Alcohol use: Never  . Drug use: Never  . Sexual activity: Not on file  Other Topics Concern  . Not on file  Social History Narrative  . Not on file   Social Determinants of Health   Financial Resource Strain:   . Difficulty of Paying Living Expenses:   Food Insecurity:   . Worried About Charity fundraiser in the Last Year:   . Arboriculturist in the Last Year:   Transportation Needs:   . Film/video editor (Medical):   Marland Kitchen Lack of Transportation (Non-Medical):   Physical Activity:   . Days of Exercise per Week:   . Minutes of Exercise per Session:   Stress:   . Feeling of Stress :   Social Connections:   . Frequency of Communication with Friends and Family:   . Frequency of Social Gatherings with Friends and Family:   . Attends Religious Services:   . Active Member of Clubs or Organizations:   . Attends Archivist Meetings:   Marland Kitchen Marital Status:   Intimate Partner Violence:   . Fear of Current or Ex-Partner:   . Emotionally Abused:   Marland Kitchen Physically Abused:   . Sexually Abused:     Current Outpatient Medications  Medication Sig Dispense Refill  . aspirin EC 81 MG tablet Take 81 mg by mouth once.    . fluticasone (FLONASE) 50 MCG/ACT nasal spray Place 1 spray into both nostrils daily as needed for allergies.    . furosemide (LASIX) 20 MG tablet Take 1 tab (20 mg) on Tues,Thurs, and Saturdays 45 tablet 1  . Hydrocortisone (0000000 EX) Apply 1 application topically at bedtime as needed (irritation on legs).     Marland Kitchen levothyroxine (SYNTHROID) 88 MCG tablet Take 88 mcg by mouth daily.    . metoprolol tartrate (LOPRESSOR) 50 MG tablet Take as directed prior to CT scan on 06/14/19 1 tablet 0  . potassium chloride SA (KLOR-CON M20) 20 MEQ tablet Take 1 tab (20 meq)  on Tues, Thurs, and Saturday 45 tablet 1  . traMADol (ULTRAM) 50 MG tablet Take 50-100 mg by mouth 3 (three) times daily as needed for moderate pain.      No current facility-administered medications for this visit.    No Known Allergies    Review of Systems:   General:  normal appetite, decreased energy, no weight gain, no weight loss, no fever  Cardiac:  no chest pain with exertion, no chest pain at rest, noSOB with exertion, no resting SOB, no PND, no orthopnea, no palpitations, no arrhythmia, no atrial fibrillation, + severe LE edema, no dizzy spells, no syncope  Respiratory:  no shortness of breath, no home oxygen, no productive cough, no dry cough, no bronchitis, no wheezing, no hemoptysis, no asthma, no pain with inspiration or cough, no sleep apnea, no CPAP at night  GI:   no difficulty swallowing, no reflux, no frequent heartburn, no hiatal hernia, no abdominal pain, + constipation, no diarrhea, no hematochezia, no hematemesis, no melena  GU:  no dysuria,  no frequency, no urinary tract infection, no hematuria, no kidney stones, no kidney disease  Vascular:  no pain suggestive of claudication, + pain in feet, NO leg cramps, + varicose veins, NO DVT, NO non-healing foot ulcer  Neuro:   NO stroke, NO TIA's, no seizures, no headaches, no temporary blindness one eye,  no slurred speech, no peripheral neuropathy, + chronic pain, + some instability of gait, ? possible memory/cognitive dysfunction  Musculoskeletal: no arthritis, no joint swelling, no myalgias, + difficulty walking, decreased mobility   Skin:   + rash, no itching, no skin infections, + ulcerations both lower legs  Psych:   + anxiety, no depression, + nervousness, no unusual recent stress  Eyes:   no blurry vision, no floaters, no recent vision changes, + wears glasses or contacts  ENT:   no hearing loss, no loose or painful teeth, no dentures, last saw dentist 6 months ago  Hematologic:  no easy bruising, no abnormal  bleeding, no clotting disorder, no frequent epistaxis  Endocrine:  no diabetes, does not check CBG's at home           Physical Exam:   BP 128/76   Pulse 95   Temp 97.9 F (36.6 C) (Skin)   Resp 20   Ht 5\' 1"  (1.549 m)   Wt 113 lb (51.3 kg)   SpO2 99% Comment: RA  BMI 21.35 kg/m   General:  Thin, elderly, somewhat anxious female  HEENT:  Unremarkable   Neck:   no JVD, no bruits, no adenopathy   Chest:   clear to auscultation, symmetrical breath sounds, no wheezes, no rhonchi   CV:   RRR, grade III/VI crescendo/decrescendo murmur heard best at RSB,  no diastolic murmur  Abdomen:  soft, non-tender, no masses   Extremities:  warm, well-perfused, pulses diminished, no LE edema  Rectal/GU  Deferred  Neuro:   Grossly non-focal and symmetrical throughout  Skin:   Clean and dry, no rashes, no breakdown   Diagnostic Tests:  TRANSTHORACIC ECHOCARDIOGRAM  Report of transthoracic echocardiogram performed at [facility] is reviewed.  Images from this examination are not currently available for review.  By report there was normal left ventricular systolic function with ejection fraction estimated 55 to 60%.  The aortic valve was reported to appear trileaflet.  Peak velocity across aortic valve was measured 4.12 m/s corresponding to mean transvalvular gradient estimated 48 mmHg.  There was trace mitral regurgitation.  There was mild to moderate tricuspid regurgitation.  There was normal right ventricular size and systolic function.    RIGHT/LEFT HEART CATH AND CORONARY ANGIOGRAPHY  Conclusion  1.  Angiographically normal coronary arteries with no evidence of coronary stenosis 2.  Severe calcific aortic stenosis with a calcified, restricted aortic valve on plain fluoroscopy and a mean pressure gradient of 38 mmHg, peak to peak gradient 43 mmHg, calculated valve area 1 cm 3.  Normal right heart pressures, normal LVEDP 4.  Oxygen saturation run suggestive of left to right shunt  with QP/QS of 1.7, consider arteriovenous mixing below the level of the IVC.  Recommend: Continued multidisciplinary heart team evaluation for treatment of severe aortic stenosis, CTA of the chest, abdomen, and pelvis both for planning of aortic valve intervention and also evaluation of left to right shunting with possibility of shunting at the abdominal level.  Indications  Severe aortic stenosis [I35.0 (ICD-10-CM)]  Procedural Details  Technical Details INDICATION: Severe aortic stenosis.   PROCEDURAL DETAILS: The right groin is prepped,  draped, and anesthetized with 1% lidocaine. Using direct ultrasound guidance a 5 French sheath is placed in the right femoral artery and a 7 French sheath is placed in the right femoral vein. [place] images are captured and stored in the patient's chart. A Swan-Ganz catheter is used for the right heart catheterization. Standard protocol is followed for recording of right heart pressures and sampling of oxygen saturations. Fick cardiac output is calculated.  Oxygen saturations are drawn from the aorta, SVC, IVC, and pulmonary artery.  Repeated saturations are drawn to confirm findings.  Standard Judkins catheters are used for selective coronary angiography. LV pressure is recorded and an aortic valve pullback gradient is recorded.  The aortic valve is crossed with a JR4 catheter and a straight wire.  There are no immediate procedural complications.  Mynx closure is used for femoral arterial hemostasis.  Manual pressure was used for venous hemostasis.  The patient is transferred to the post catheterization recovery area for further monitoring.    Estimated blood loss <50 mL.   During this procedure medications were administered to achieve and maintain moderate conscious sedation while the patient's heart rate, blood pressure, and oxygen saturation were continuously monitored and I was present face-to-face 100% of this time.  Medications (Filter: Administrations  occurring from 06/09/19 0955 to 06/09/19 1128) (important)  Continuous medications are totaled by the amount administered until 06/09/19 1128.  Heparin (Porcine) in NaCl 1000-0.9 UT/500ML-% SOLN (mL) Total volume:  1,000 mL Date/Time  Rate/Dose/Volume Action  06/09/19 1011  500 mL Given  1011  500 mL Given    fentaNYL (SUBLIMAZE) injection (mcg) Total dose:  25 mcg Date/Time  Rate/Dose/Volume Action  06/09/19 1028  25 mcg Given    midazolam (VERSED) injection (mg) Total dose:  1 mg Date/Time  Rate/Dose/Volume Action  06/09/19 1028  1 mg Given    lidocaine (PF) (XYLOCAINE) 1 % injection (mL) Total volume:  10 mL Date/Time  Rate/Dose/Volume Action  06/09/19 1028  10 mL Given    iohexol (OMNIPAQUE) 350 MG/ML injection (mL) Total volume:  40 mL Date/Time  Rate/Dose/Volume Action  06/09/19 1116  40 mL Given    Sedation Time  Sedation Time Physician-1: 34 minutes 3 seconds  Contrast  Medication Name Total Dose  iohexol (OMNIPAQUE) 350 MG/ML injection 40 mL    Radiation/Fluoro  Fluoro time: 7.7 (min) DAP: 6917 (mGycm2) Cumulative Air Kerma: 102 (mGy)  Coronary Findings  Diagnostic Dominance: Right Left Main  The left main is short. It divides into the LAD and left circumflex. There is no stenosis present.  Left Anterior Descending  Vessel is angiographically normal. The LAD proper is small in caliber. The first diagonal branch supplies a larger territory and neither branch has evidence of any angiographic disease. The vessels are widely patent with no stenosis.  Left Circumflex  Vessel is angiographically normal. The circumflex is patent with no stenosis.  Right Coronary Artery  The RCA is dominant. The vessel has no stenosis. The PDA is patent.  Intervention  No interventions have been documented. Left Heart  Left Ventricle LV end diastolic pressure is normal.  Aortic Valve There is severe aortic valve stenosis. The aortic valve is calcified. There is restricted  aortic valve motion. There is severe aortic stenosis with a mean transvalvular gradient of 38 mmHg and a calculated valve area of 1 cm  Coronary Diagrams  Diagnostic Dominance: Right  Intervention  Implants   Vascular Products  Closure Mynx Control 2f [id number] - Implanted Inventory item:  CLOSURE MYNX CONTROL 78F Model/Cat number: [id number]  Manufacturer: [place] Lot number: [id number]  Device identifier: [id number] Device identifier type: GS1  Area Of Implantation: Femoral Artery    GUDID Information  Request status Successful    Brand name: MYNX CONTROL Version/Model: [id number]  Company name: [employer] safety info as of 06/09/19: MR Safe  Contains dry or latex rubber: No    GMDN P.T. name: Wound hydrogel dressing, non-antimicrobial    As of 06/09/2019  Status: Implanted      Syngo Images  Show images for CARDIAC CATHETERIZATION  Images on Long Term Storage  Show images for [place], [place] to Procedure Log  Procedure Log    Hemo Data   Most Recent Value  Fick Cardiac Output 7.42 L/min  Fick Cardiac Output Index 5.02 (L/min)/BSA  Aortic Mean Gradient 38.02 mmHg  Aortic Peak Gradient 43 mmHg  Aortic Valve Area 1.02  Aortic Value Area Index 0.69 cm2/BSA  RA A Wave 7 mmHg  RA V Wave 4 mmHg  RA Mean 5 mmHg  RV Systolic Pressure 38 mmHg  RV Diastolic Pressure 2 mmHg  RV EDP 7 mmHg  PA Systolic Pressure 38 mmHg  PA Diastolic Pressure 12 mmHg  PA Mean 24 mmHg  PW A Wave 13 mmHg  PW V Wave 9 mmHg  PW Mean 9 mmHg  AO Systolic Pressure A999333 mmHg  AO Diastolic Pressure 65 mmHg  AO Mean 89 mmHg  LV Systolic Pressure 99991111 mmHg  LV Diastolic Pressure 9 mmHg  LV EDP 14 mmHg  AOp Systolic Pressure A999333 mmHg  AOp Diastolic Pressure 75 mmHg  AOp Mean Pressure 123456 mmHg  LVp Systolic Pressure 123XX123 mmHg  LVp Diastolic Pressure 8 mmHg  LVp EDP Pressure 14 mmHg  QP/QS 1.67  TPVR Index 4.79 HRUI  TSVR Index 17.74 HRUI  PVR SVR Ratio  0.18  TPVR/TSVR Ratio 0.27      Cardiac TAVR CT  TECHNIQUE: The patient was scanned on a [employer]. A 120 kV retrospective scan was triggered in the descending thoracic aorta at 111 HU's. Gantry rotation speed was 250 msecs and collimation was .6 mm. No beta blockade or nitro were given. The 3D data set was reconstructed in 5% intervals of the R-R cycle. Systolic and diastolic phases were analyzed on a dedicated work station using MPR, MIP and VRT modes. The patient received 80 cc of contrast.  FINDINGS: Image quality: Excellent.  Noise artifact is: Limited.  Valve Morphology: The aortic valve is bicuspid with fusion of the RCC/LCC (Sievers type 1, raphe type). There is moderate calcification of the RCC/LCC with severe thickening. There is no significant raphe calcification. There is severely restricted movement of the valve leaflets in systole.  Aortic Valve area: 0.71 cm2  Aortic Valve Calcium score: 1590  Aortic annular dimension:  Phase assessed: 30%  Annular area: 336 mm2  Annular perimeter: 66.2 mm  Max diameter: 23.3 mm  Min diameter: 18.7 mm  Annular and subannular calcification: No significant annular or subannular calcification.  Optimal coplanar projection: RAO 5 CRA 1  Coronary Artery Height above Annulus:  Left Main: 12.2 mm  Right Coronary: 15.5 mm  Sinus of Valsalva Measurements (Bicuspid Valve):  Commissure to Commissure: 23 mm.  Maximum Diameter: 29 mm.  Sinus of Valsalva Height:  Non-coronary: 23 mm  Right-coronary: 19 mm  Left-coronary: 18 mm  Sinotubular Junction: 23 mm.  Mild calcified plaque that is layered.  Ascending  Thoracic Aorta: 27 mm.  No significant calcifications.  Coronary Arteries: Normal coronary origin. Right dominance. The study was performed without use of NTG and is insufficient for plaque evaluation. Please refer to recent cardiac cath for  coronary assessment.  Cardiac Morphology:  Right Atrium: Right atrial size is severely dilated. There is contrast reflux into the IVC consistent with elevated RA pressure.  Right Ventricle: The right ventricular cavity is severely dilated with mildy reduced function.  Tricuspid Valve: There is incomplete coaptation of the tricuspid valve consistent with at least moderate regurgitation. The valve leaflets appear normal.  Left Atrium: Left atrial size is dilated with no left atrial appendage filling defect. The IAS bows toward the RA consistent with increased LA pressure. There is a large secundum ASD present in the IAS that measures 10 mm x 12 mm with L to R shunting. The aortic rim measures 5.4 mm. The posterior rim measures 17 mm. The SVC rim measures 22 mm. The IVC rim measures 23 mm. The superior rim measures 22 mm and the atrioventricular rim measures 28 mm.  Left Ventricle: The ventricular cavity size is within normal limits. There are no stigmata of prior infarction. There is no abnormal filling defect. The septum is flattened in systole/diastole consistent with RV volume/pressure overload. LVEF=79%.  Pulmonary arteries: Dilated consistent with pulmonary hypertension.  Pulmonary veins: Normal pulmonary venous drainage.  Pericardium: Normal thickness with no significant effusion or calcium present.  Mitral Valve: The mitral valve is normal structure without significant calcification.  Extra-cardiac findings: See attached radiology report for non-cardiac structures.  IMPRESSION: 1. Bicuspid Aortic Valve with fusion of the RCC/LCC (Sievers type 1 with raphe).  2. Small annulus noted (336 mm2). Appropriate for 26 mm Medtronic Evolut TAVR.  3. No significant annular or subannular calcification.  4. Sufficient coronary to annulus distance.  5. Optimal Fluoroscopic Angle for Delivery: RAO 5 CRA 1  6. Large secundum type ASD (10 mm x 12 mm) with  rim measurements for ASD closure described above. L to R shunting noted.  7. Severely dilated RA/RV and flattened septum consistent with RV volume/pressure overload.  8. Dilated pulmonary artery consistent with pulmonary hypertension.  [place] T. Audie Box, MD   Electronically Signed   By: Eleonore Chiquito   On: 06/14/2019 18:10        CT ANGIOGRAPHY CHEST, ABDOMEN AND PELVIS  TECHNIQUE: Non-contrast CT of the chest was initially obtained.  Multidetector CT imaging through the chest, abdomen and pelvis was performed using the standard protocol during bolus administration of intravenous contrast. Multiplanar reconstructed images and MIPs were obtained and reviewed to evaluate the vascular anatomy.  CONTRAST:  188mL OMNIPAQUE IOHEXOL 350 MG/ML SOLN  COMPARISON:  None.  FINDINGS: CTA CHEST FINDINGS  Cardiovascular: Heart size is mildly enlarged with right atrial dilatation. Probable atrial septal defect given apparent flow of high density contrast material from the left atrium back into the right atrium (best appreciated on axial image 80 of series 15). There is no significant pericardial fluid, thickening or pericardial calcification. Aortic atherosclerosis. No definite calcified atherosclerotic plaque in the coronary arteries. Severe thickening calcification of the aortic valve.  Mediastinum/Lymph Nodes: No pathologically enlarged mediastinal or hilar lymph nodes. Esophagus is unremarkable in appearance. No axillary lymphadenopathy. Surgical clips in the left axilla from prior lymph node dissection.  Lungs/Pleura: No suspicious appearing pulmonary nodules or masses are noted. No acute consolidative airspace disease. No pleural effusions.  Musculoskeletal/Soft Tissues: Status post left modified radical mastectomy and left axillary nodal  dissection. There are no aggressive appearing lytic or blastic lesions noted in the visualized portions of the  skeleton.  CTA ABDOMEN AND PELVIS FINDINGS  Hepatobiliary: Liver has a slightly shrunken appearance and nodular contour, indicative of underlying cirrhosis. Densely calcified lesion in the periphery of segments 2/3 of the liver (axial image 109 of series 15), presumably a benign lesions such as a calcified granuloma. No other definite suspicious hepatic lesions. No intra or extrahepatic biliary ductal dilatation. Gallbladder is normal in appearance.  Pancreas: No pancreatic mass. No pancreatic ductal dilatation. No pancreatic or peripancreatic fluid collections or inflammatory changes.  Spleen: Unremarkable.  Adrenals/Urinary Tract: Bilateral kidneys and adrenal glands are normal in appearance. No hydroureteronephrosis. Urinary bladder is nearly decompressed, but otherwise unremarkable in appearance.  Stomach/Bowel: Normal appearance of the stomach. No pathologic dilatation of small bowel or colon. A few scattered colonic diverticulae are noted, most evident in the region of the sigmoid colon, without surrounding inflammatory changes to suggest an acute diverticulitis at this time. The appendix is not confidently identified and may be surgically absent. Regardless, there are no inflammatory changes noted adjacent to the cecum to suggest the presence of an acute appendicitis at this time.  Vascular/Lymphatic: Aortic atherosclerosis with vascular findings and measurements pertinent to potential TAVR procedure, as detailed below. No aneurysm or dissection in the abdominal or pelvic vasculature. No lymphadenopathy noted in the abdomen or pelvis.  Reproductive: Status post total abdominal hysterectomy. Ovaries are not confidently identified may be surgically absent or atrophic.  Other: Trace volume of ascites.  No pneumoperitoneum.  Musculoskeletal: There are no aggressive appearing lytic or blastic lesions noted in the visualized portions of the skeleton.  VASCULAR  MEASUREMENTS PERTINENT TO TAVR:  AORTA:  Minimal Aortic Diameter-10 x 6 mm  Severity of Aortic Calcification-moderate  RIGHT PELVIS:  Right Common Iliac Artery -  Minimal Diameter-5.5 x 5.9 mm  Tortuosity - mild  Calcification - mild  Right External Iliac Artery -  Minimal Diameter-5.5 x 5.8 mm  Tortuosity - mild  Calcification-none  Right Common Femoral Artery -  Minimal Diameter-5.6 x 5.5 mm  Tortuosity - mild  Calcification-none  LEFT PELVIS:  Left Common Iliac Artery -  Minimal Diameter-6.6 x 6.3 mm  Tortuosity - mild  Calcification - mild  Left External Iliac Artery -  Minimal Diameter-5.5 x 5.7 mm  Tortuosity - mild  Calcification-none  Left Common Femoral Artery -  Minimal Diameter-5.7 x 5.5 mm  Tortuosity-mild  Calcification-none  Review of the MIP images confirms the above findings.  IMPRESSION: 1. Vascular findings and measurements pertinent to potential TAVR procedure, as detailed above. 2. Severe thickening calcification of the aortic valve, compatible with reported clinical history of severe aortic stenosis. 3. Probable atrial septal defect with enlarged right atrium, as discussed above. Clinical correlation for signs and symptoms of left right shunting is recommended. 4. Cirrhosis. 5. Aortic atherosclerosis. 6. Colonic diverticulosis without evidence of acute diverticulitis at this time. 7. Additional incidental findings, as above.   Electronically Signed   By: Vinnie Langton M.D.   On: 06/14/2019 12:14     EKG: NSR w/out significant AV conduction delay (05/27/2019)     Impression:  Patient has at least stage C1 and possibly early stage D severe symptomatic aortic stenosis.  She also has what appears to be a relatively large secundum type atrial septal defect.  The patient's primary complaint at this time is that of severe pain in both legs related to severe swelling and open  ulcerations in the setting of varicose veins with chronic venous insufficiency.  I have personally reviewed the report from the patient's recent transthoracic echocardiogram performed at [facility] in [place].  I have also personally reviewed the report and images from the patient's diagnostic cardiac catheterization and CT angiograms.  The patient has severe aortic stenosis.  She does not have significant coronary artery disease.  Right heart catheterization revealed stepup in oxygen saturation consistent with left to right shunting and Qp/Qs calculated 1.7:1.0.  Cardiac gated CT angiogram of the heart confirms the presence of findings consistent with severe aortic stenosis and also reveals what appears to be a fairly large secundum type atrial septal defect.  CT angiography also reveals findings in the liver suggestive of underlying cirrhosis.  I agree the patient would benefit from aortic valve replacement and closure of her secundum type atrial septal defect.  I would be reluctant to consider this elderly patient with complex medical problems to be candidate for conventional surgery.  Cardiac-gated CTA of the heart reveals anatomical characteristics consistent with aortic stenosis suitable for treatment by transcatheter aortic valve replacement without any significant complicating features and CTA of the aorta and iliac vessels demonstrate what appears to be adequate pelvic vascular access to facilitate a transfemoral approach.  I would agree the closure of her atrial septal defect should probably wait until after she has undergone transcatheter aortic valve replacement.  She appears to need more aggressive efforts to manage her chronic lower extremity edema and ulcerations, and it might be wise to obtain formal liver function testing and blood work to ascertain whether or not she may have significant underlying chronic liver disease.    Plan:  The patient and her friend were counseled at  length regarding treatment alternatives for management of severe symptomatic aortic stenosis and atrial septal defect.  Alternative approaches such as conventional aortic valve replacement with surgical closure of her ASD, transcatheter aortic valve replacement followed by transcatheter closure of her ASD, and continued medical therapy without intervention were compared and contrasted at length.  The risks associated with conventional surgical aortic valve replacement were discussed in detail, as were expectations for post-operative convalescence, and why I would be reluctant to consider this patient a candidate for conventional surgery.  The need to proceed with transesophageal echocardiogram to further evaluate the atrial septal defect and ascertain whether or not it could be closed using transcatheter-based techniques was discussed.  I emphasized the need to take more aggressive approach to management of her lower extremity edema and skin ulcerations.  In particular I have encouraged the patient to resume attempts to wear compression stockings.  Follow-up with vascular surgery was encouraged.  All questions answered.  Patient will undergo transesophageal echocardiogram early next week.  She will be seen in follow-up in the multidisciplinary heart valve clinic in the near future to determine next steps.   I spent in excess of 90 minutes during the conduct of this office consultation and >50% of this time involved direct face-to-face encounter with the patient for counseling and/or coordination of their care.      Valentina Gu. Roxy Manns, MD 06/15/2019 2:19 PM

## 2019-06-15 NOTE — Telephone Encounter (Signed)
Follow Up:   Dr Burt Knack have referred this pt to them . They have tried 3 times with no success to contact pt. They are closing this referral and wanted Dr Burt Knack to know this please.pp

## 2019-06-15 NOTE — Telephone Encounter (Signed)
See notes - looks like wound care clinic couldn't get in touch with her

## 2019-06-15 NOTE — Telephone Encounter (Signed)
I contacted Vivien Rota at the [facility] and advised her of phone numbers to contact the pt.  The pt's husband is Darcey Nora [phone] and her friend Jeannene Patella [phone]. Vivien Rota will attempt to contact the pt again in regards to referral.

## 2019-06-16 NOTE — Telephone Encounter (Signed)
The patient has been scheduled with the [facility] 6/9.

## 2019-06-17 ENCOUNTER — Other Ambulatory Visit: Payer: Self-pay | Admitting: Physician Assistant

## 2019-06-17 DIAGNOSIS — K7460 Unspecified cirrhosis of liver: Secondary | ICD-10-CM

## 2019-06-19 ENCOUNTER — Other Ambulatory Visit (HOSPITAL_COMMUNITY)
Admission: RE | Admit: 2019-06-19 | Discharge: 2019-06-19 | Disposition: A | Discharge status: Home / Self Care | Payer: PPO | Source: Ambulatory Visit | Attending: Cardiovascular Disease | Admitting: Cardiovascular Disease

## 2019-06-19 DIAGNOSIS — Z01812 Encounter for preprocedural laboratory examination: Secondary | ICD-10-CM | POA: Diagnosis not present

## 2019-06-19 DIAGNOSIS — Z20822 Contact with and (suspected) exposure to covid-19: Secondary | ICD-10-CM | POA: Diagnosis not present

## 2019-06-19 LAB — SARS CORONAVIRUS 2 (TAT 6-24 HRS): SARS Coronavirus 2: NEGATIVE

## 2019-06-22 ENCOUNTER — Encounter (HOSPITAL_COMMUNITY): Payer: Self-pay | Admitting: Cardiovascular Disease

## 2019-06-22 ENCOUNTER — Ambulatory Visit (HOSPITAL_COMMUNITY): Payer: PPO | Admitting: Certified Registered"

## 2019-06-22 ENCOUNTER — Telehealth: Payer: Self-pay | Admitting: Physician Assistant

## 2019-06-22 ENCOUNTER — Ambulatory Visit (HOSPITAL_BASED_OUTPATIENT_CLINIC_OR_DEPARTMENT_OTHER): Payer: PPO

## 2019-06-22 ENCOUNTER — Ambulatory Visit (HOSPITAL_COMMUNITY)
Admission: RE | Admit: 2019-06-22 | Discharge: 2019-06-22 | Disposition: A | Discharge status: Home / Self Care | Payer: PPO | Attending: Cardiovascular Disease | Admitting: Cardiovascular Disease

## 2019-06-22 ENCOUNTER — Encounter (HOSPITAL_COMMUNITY)
Admission: RE | Disposition: A | Discharge status: Home / Self Care | Payer: Self-pay | Source: Home / Self Care | Attending: Cardiovascular Disease

## 2019-06-22 ENCOUNTER — Other Ambulatory Visit: Payer: Self-pay

## 2019-06-22 ENCOUNTER — Other Ambulatory Visit: Payer: Self-pay | Admitting: Physician Assistant

## 2019-06-22 DIAGNOSIS — I350 Nonrheumatic aortic (valve) stenosis: Secondary | ICD-10-CM | POA: Insufficient documentation

## 2019-06-22 DIAGNOSIS — K7460 Unspecified cirrhosis of liver: Secondary | ICD-10-CM

## 2019-06-22 DIAGNOSIS — Q211 Atrial septal defect: Secondary | ICD-10-CM | POA: Diagnosis not present

## 2019-06-22 DIAGNOSIS — Z79890 Hormone replacement therapy (postmenopausal): Secondary | ICD-10-CM | POA: Insufficient documentation

## 2019-06-22 DIAGNOSIS — Z79899 Other long term (current) drug therapy: Secondary | ICD-10-CM | POA: Insufficient documentation

## 2019-06-22 DIAGNOSIS — R609 Edema, unspecified: Secondary | ICD-10-CM | POA: Insufficient documentation

## 2019-06-22 DIAGNOSIS — Z853 Personal history of malignant neoplasm of breast: Secondary | ICD-10-CM | POA: Insufficient documentation

## 2019-06-22 DIAGNOSIS — E039 Hypothyroidism, unspecified: Secondary | ICD-10-CM | POA: Insufficient documentation

## 2019-06-22 DIAGNOSIS — I10 Essential (primary) hypertension: Secondary | ICD-10-CM | POA: Diagnosis not present

## 2019-06-22 HISTORY — PX: TEE WITHOUT CARDIOVERSION: SHX5443

## 2019-06-22 HISTORY — DX: Other complications of anesthesia, initial encounter: T88.59XA

## 2019-06-22 LAB — COMPREHENSIVE METABOLIC PANEL
ALT: 16 U/L (ref 0–44)
AST: 17 U/L (ref 15–41)
Albumin: 2.6 g/dL — ABNORMAL LOW (ref 3.5–5.0)
Alkaline Phosphatase: 83 U/L (ref 38–126)
Anion gap: 7 (ref 5–15)
BUN: 15 mg/dL (ref 8–23)
CO2: 23 mmol/L (ref 22–32)
Calcium: 7.5 mg/dL — ABNORMAL LOW (ref 8.9–10.3)
Chloride: 112 mmol/L — ABNORMAL HIGH (ref 98–111)
Creatinine, Ser: 0.71 mg/dL (ref 0.44–1.00)
GFR calc Af Amer: >60 mL/min (ref >60)
GFR calc non Af Amer: >60 mL/min (ref >60)
Glucose, Bld: 81 mg/dL (ref 70–99)
Potassium: 3.1 mmol/L — ABNORMAL LOW (ref 3.5–5.1)
Sodium: 142 mmol/L (ref 135–145)
Total Bilirubin: 0.4 mg/dL (ref 0.3–1.2)
Total Protein: 4.6 g/dL — ABNORMAL LOW (ref 6.5–8.1)

## 2019-06-22 LAB — POCT I-STAT, CHEM 8
BUN: 21 mg/dL (ref 8–23)
Calcium, Ion: 0.95 mmol/L — ABNORMAL LOW (ref 1.15–1.40)
Chloride: 106 mmol/L (ref 98–111)
Creatinine, Ser: 0.7 mg/dL (ref 0.44–1.00)
Glucose, Bld: 78 mg/dL (ref 70–99)
HCT: 43 % (ref 36.0–46.0)
Hemoglobin: 14.6 g/dL (ref 12.0–15.0)
Potassium: 3.9 mmol/L (ref 3.5–5.1)
Sodium: 139 mmol/L (ref 135–145)
TCO2: 23 mmol/L (ref 22–32)

## 2019-06-22 SURGERY — ECHOCARDIOGRAM, TRANSESOPHAGEAL
Anesthesia: Monitor Anesthesia Care

## 2019-06-22 MED ORDER — SODIUM CHLORIDE 0.9 % IV SOLN: INTRAVENOUS | Status: DC | PRN

## 2019-06-22 MED ORDER — FENTANYL CITRATE (PF) 100 MCG/2ML IJ SOLN
INTRAMUSCULAR | Status: DC | PRN
  Administered 2019-06-22: 25 ug via INTRAVENOUS

## 2019-06-22 MED ORDER — SODIUM CHLORIDE 0.9 % IV SOLN: INTRAVENOUS | Status: DC

## 2019-06-22 MED ORDER — FENTANYL CITRATE (PF) 100 MCG/2ML IJ SOLN
INTRAMUSCULAR | Status: AC
  Filled 2019-06-22: qty 2

## 2019-06-22 MED ORDER — PROPOFOL 10 MG/ML IV BOLUS
INTRAVENOUS | Status: DC | PRN
  Administered 2019-06-22: 20 mg via INTRAVENOUS
  Administered 2019-06-22: 10 mg via INTRAVENOUS
  Administered 2019-06-22: 30 mg via INTRAVENOUS
  Administered 2019-06-22: 10 mg via INTRAVENOUS
  Administered 2019-06-22: 30 mg via INTRAVENOUS

## 2019-06-22 MED ORDER — BUTAMBEN-TETRACAINE-BENZOCAINE 2-2-14 % EX AERO
INHALATION_SPRAY | CUTANEOUS | Status: DC | PRN
  Administered 2019-06-22: 2 via TOPICAL

## 2019-06-22 MED ORDER — PROPOFOL 500 MG/50ML IV EMUL
INTRAVENOUS | Status: DC | PRN
  Administered 2019-06-22: 50 ug/kg/min via INTRAVENOUS

## 2019-06-22 MED ORDER — ESMOLOL HCL 100 MG/10ML IV SOLN
INTRAVENOUS | Status: DC | PRN
Start: 2019-06-22 — End: 2019-06-22
  Administered 2019-06-22: 30 mg via INTRAVENOUS

## 2019-06-22 MED ORDER — LIDOCAINE 2% (20 MG/ML) 5 ML SYRINGE
INTRAMUSCULAR | Status: DC | PRN
Start: 2019-06-22 — End: 2019-06-22
  Administered 2019-06-22: 40 mg via INTRAVENOUS

## 2019-06-22 MED ORDER — MIDAZOLAM HCL (PF) 5 MG/ML IJ SOLN
INTRAMUSCULAR | Status: AC
  Filled 2019-06-22: qty 2

## 2019-06-22 NOTE — Anesthesia Preprocedure Evaluation (Signed)
Anesthesia Evaluation  Patient identified by MRN, date of birth, ID band Patient awake    Reviewed: Allergy & Precautions, NPO status , Patient's Chart, lab work & pertinent test results, reviewed documented beta blocker date and time   History of Anesthesia Complications Negative for: history of anesthetic complications  Airway Mallampati: II  TM Distance: >3 FB Neck ROM: Full    Dental no notable dental hx.    Pulmonary neg pulmonary ROS,    breath sounds clear to auscultation       Cardiovascular hypertension, Pt. on medications and Pt. on home beta blockers + Valvular Problems/Murmurs AS  Rhythm:Regular Rate:Normal + Systolic murmurs TTE 02/5187: EF 55-60%, impaired relaxation, mild AR, severe AS (mean gradient 48, valve area 0.26), mild-moderate TR   Neuro/Psych negative neurological ROS  negative psych ROS   GI/Hepatic negative GI ROS, Neg liver ROS,   Endo/Other  Hypothyroidism   Renal/GU negative Renal ROS  negative genitourinary   Musculoskeletal negative musculoskeletal ROS (+)   Abdominal   Peds  Hematology negative hematology ROS (+)   Anesthesia Other Findings Day of surgery medications reviewed with patient.  Reproductive/Obstetrics negative OB ROS                             Anesthesia Physical Anesthesia Plan  ASA: III  Anesthesia Plan: MAC   Post-op Pain Management:    Induction:   PONV Risk Score and Plan: 2 and Treatment may vary due to age or medical condition and Propofol infusion  Airway Management Planned: Natural Airway and Simple Face Mask  Additional Equipment:   Intra-op Plan:   Post-operative Plan:   Informed Consent: I have reviewed the patients History and Physical, chart, labs and discussed the procedure including the risks, benefits and alternatives for the proposed anesthesia with the patient or authorized representative who has indicated  his/her understanding and acceptance.       Plan Discussed with: CRNA  Anesthesia Plan Comments:         Anesthesia Quick Evaluation

## 2019-06-22 NOTE — Anesthesia Procedure Notes (Signed)
Procedure Name: MAC Date/Time: 06/22/2019 10:50 AM Performed by: Griffin Dakin, CRNA Pre-anesthesia Checklist: Patient identified, Emergency Drugs available, Suction available and Patient being monitored Patient Re-evaluated:Patient Re-evaluated prior to induction Oxygen Delivery Method: Nasal cannula Induction Type: IV induction Number of attempts: 1 Airway Equipment and Method: Bite block Placement Confirmation: positive ETCO2 and breath sounds checked- equal and bilateral Dental Injury: Teeth and Oropharynx as per pre-operative assessment

## 2019-06-22 NOTE — Discharge Instructions (Signed)

## 2019-06-22 NOTE — Anesthesia Postprocedure Evaluation (Signed)
Anesthesia Post Note  Patient: Denise Bailey  Procedure(s) Performed: TRANSESOPHAGEAL ECHOCARDIOGRAM (TEE) (N/A )     Patient location during evaluation: PACU Anesthesia Type: MAC Level of consciousness: awake and alert and oriented Pain management: pain level controlled Vital Signs Assessment: post-procedure vital signs reviewed and stable Respiratory status: spontaneous breathing, nonlabored ventilation and respiratory function stable Cardiovascular status: blood pressure returned to baseline Postop Assessment: no apparent nausea or vomiting Anesthetic complications: no    Last Vitals:  Vitals:   06/22/19 1140 06/22/19 1150  BP: 137/65 (!) 152/61  Pulse: 95 86  Resp: 20 14  Temp:    SpO2: 95% 95%    Last Pain:  Vitals:   06/22/19 1150  TempSrc:   PainSc: [place]

## 2019-06-22 NOTE — Interval H&P Note (Signed)
History and Physical Interval Note:  06/22/2019 10:13 AM  Denise Bailey  has presented today for surgery, with the diagnosis of ATRIAL SEPTAL DEFECT.  The various methods of treatment have been discussed with the patient and family. After consideration of risks, benefits and other options for treatment, the patient has consented to  Procedure(s): TRANSESOPHAGEAL ECHOCARDIOGRAM (TEE) (N/A) as a surgical intervention.  The patient's history has been reviewed, patient examined, no change in status, stable for surgery.  I have reviewed the patient's chart and labs.  Questions were answered to the patient's satisfaction.     TEE for secundum ASD closure evaluation. Known severe aortic stenosis. NPO.   [place] Bells T. Audie Box, [place]  [address], [place],  [address] [phone]  10:13 AM

## 2019-06-22 NOTE — Transfer of Care (Signed)
Immediate Anesthesia Transfer of Care Note  Patient: Denise Bailey  Procedure(s) Performed: TRANSESOPHAGEAL ECHOCARDIOGRAM (TEE) (N/A )  Patient Location: Endoscopy Unit  Anesthesia Type:MAC Level of Consciousness: awake, alert  and oriented  Airway & Oxygen Therapy: Patient Spontanous Breathing  Post-op Assessment: Report given to RN and Post -op Vital signs reviewed and stable  Post vital signs: Reviewed and stable  Last Vitals:  Vitals Value Taken Time  BP 159/82 06/22/19 1130  Temp    Pulse 92 06/22/19 1130  Resp 12 06/22/19 1130  SpO2 98 % 06/22/19 1130    Last Pain:  Vitals:   06/22/19 1130  TempSrc: Oral  PainSc:          Complications: No apparent anesthesia complications

## 2019-06-22 NOTE — Progress Notes (Signed)
Echocardiogram Echocardiogram Transesophageal has been performed.  Oneal Deputy Hortencia Martire 06/22/2019, 12:08 PM

## 2019-06-22 NOTE — Telephone Encounter (Addendum)
  [facility]   MULTIDISCIPLINARY HEART VALVE TEAM  Pt underwent TEE today which confirmed a large ASD with adequate rims for closure as well as severe AS. I ordered a CMP today which showed normal liver function studies. I also scheduled her for a Liver US doppler at the request of Dr. Roxy Manns. This has been scheduled for 6/9 @ 11:30am followed by an appointment in the wound center to help treat her lower extremity edema with ulcerations. Plan is for TAVR followed by staged ASD closure.   Denise Form PA-C  [facility]

## 2019-06-22 NOTE — CV Procedure (Signed)
    TRANSESOPHAGEAL ECHOCARDIOGRAM   NAME:  Denise Bailey    MRN: [id number] DOB:  01/31/1945    ADMIT DATE: 06/22/2019  INDICATIONS: ASD/Severe AS  PROCEDURE:   Informed consent was obtained prior to the procedure. The risks, benefits and alternatives for the procedure were discussed and the patient comprehended these risks.  Risks include, but are not limited to, cough, sore throat, vomiting, nausea, somnolence, esophageal and stomach trauma or perforation, bleeding, low blood pressure, aspiration, pneumonia, infection, trauma to the teeth and death.    Procedural time out performed. The oropharynx was anesthetized with topical 1% benzocaine.    Anesthesia was administered by Dr. Daiva Huge. The patient's heart rate, blood pressure, and oxygen saturation are monitored continuously during the procedure. The period of conscious sedation is 28 minutes, of which I was present face-to-face 100% of this time.   The transesophageal probe was inserted in the esophagus and stomach without difficulty and multiple views were obtained.   COMPLICATIONS:    There were no immediate complications.  KEY FINDINGS:  1. Large secundum ASD.  2. Severe aortic stenosis.  3. Moderate TR.  4. Normal LVEF 55%.  5. Moderately enlarged RV/moderately reduced function, 6. Full report to follow.  [place] T. Audie Box, [place]  [address], [place],  [address] [phone]  11:53 AM

## 2019-06-25 ENCOUNTER — Other Ambulatory Visit (HOSPITAL_COMMUNITY): Payer: PPO

## 2019-06-25 ENCOUNTER — Encounter (HOSPITAL_COMMUNITY): Payer: PPO

## 2019-06-30 ENCOUNTER — Encounter (HOSPITAL_BASED_OUTPATIENT_CLINIC_OR_DEPARTMENT_OTHER): Payer: PPO | Attending: Internal Medicine | Admitting: Physician Assistant

## 2019-06-30 ENCOUNTER — Ambulatory Visit (HOSPITAL_COMMUNITY)
Admission: RE | Admit: 2019-06-30 | Discharge: 2019-06-30 | Disposition: A | Discharge status: Home / Self Care | Payer: PPO | Source: Ambulatory Visit | Attending: Physician Assistant | Admitting: Physician Assistant

## 2019-06-30 ENCOUNTER — Other Ambulatory Visit: Payer: Self-pay

## 2019-06-30 DIAGNOSIS — K7460 Unspecified cirrhosis of liver: Secondary | ICD-10-CM | POA: Diagnosis not present

## 2019-06-30 DIAGNOSIS — L97822 Non-pressure chronic ulcer of other part of left lower leg with fat layer exposed: Secondary | ICD-10-CM | POA: Diagnosis not present

## 2019-06-30 DIAGNOSIS — I872 Venous insufficiency (chronic) (peripheral): Secondary | ICD-10-CM | POA: Insufficient documentation

## 2019-06-30 DIAGNOSIS — I350 Nonrheumatic aortic (valve) stenosis: Secondary | ICD-10-CM | POA: Insufficient documentation

## 2019-06-30 DIAGNOSIS — K7689 Other specified diseases of liver: Secondary | ICD-10-CM | POA: Diagnosis not present

## 2019-06-30 DIAGNOSIS — Z9221 Personal history of antineoplastic chemotherapy: Secondary | ICD-10-CM | POA: Diagnosis not present

## 2019-06-30 DIAGNOSIS — I890 Lymphedema, not elsewhere classified: Secondary | ICD-10-CM | POA: Insufficient documentation

## 2019-06-30 NOTE — Progress Notes (Signed)
Denise Bailey, Denise Bailey ([id number]) Visit Report for 06/30/2019 Abuse/Suicide Risk Screen Details Patient Name: Date of Service: Denise Bailey, Denise Bailey 06/30/2019 1:15 PM Medical Record Number: [id number] Patient Account Number: [id number] Date of Birth/Sex: Treating RN: 07-06-1945 (74 y.o. Denise Bailey Primary Care Tameron Lama: Marlena Clipper Other Clinician: Referring Dario Yono: Treating Josie Burleigh/Extender: Elliot Gurney, MELISA Weeks in Treatment: 0 Abuse/Suicide Risk Screen Items Answer ABUSE RISK SCREEN: Has anyone close to you tried to hurt or harm you recentlyo No Do you feel uncomfortable with anyone in your familyo No Has anyone forced you do things that you didnt want to doo No Electronic Signature(s) Signed: 06/30/2019 5:57:14 PM By: Deon Pilling Entered By: Deon Pilling on 06/30/2019 13:37:36 -------------------------------------------------------------------------------- Activities of Daily Living Details Patient Name: Date of Service: Denise Bailey, Denise Bailey 06/30/2019 1:15 PM Medical Record Number: [id number] Patient Account Number: [id number] Date of Birth/Sex: Treating RN: Oct 06, 1945 (74 y.o. Denise Bailey Primary Care Jaeley Wiker: Marlena Clipper Other Clinician: Referring Joleene Burnham: Treating Treesa Mccully/Extender: Elliot Gurney, MELISA Weeks in Treatment: 0 Activities of Daily Living Items Answer Activities of Daily Living (Please select one for each item) Drive Automobile Not Able T Medications ake Completely Able Use T elephone Completely Able Care for Appearance Completely Able Use T oilet Completely Able Bath / Shower Completely Able Dress Self Need Assistance Feed Self Completely Able Walk Need Assistance Get In / Out Bed Completely [place] for Self Need Assistance Electronic Signature(s) Signed: 06/30/2019 5:57:14 PM By: Deon Pilling Entered By: Deon Pilling on  06/30/2019 13:38:52 -------------------------------------------------------------------------------- Education Screening Details Patient Name: Date of Service: Denise Ice NNE S. 06/30/2019 1:15 PM Medical Record Number: [id number] Patient Account Number: [id number] Date of Birth/Sex: Treating RN: 1946-01-06 (74 y.o. Denise Bailey Primary Care Christia Domke: Marlena Clipper Other Clinician: Referring Kaylla Cobos: Treating Maribelle Hopple/Extender: Elliot Gurney, MELISA Weeks in Treatment: 0 Primary Learner Assessed: Patient Learning Preferences/Education Level/Primary Language Learning Preference: Explanation, Demonstration, Printed Material Highest Education Level: High School Preferred Language: English Cognitive Barrier Language Barrier: No Translator Needed: No Memory Deficit: No Emotional Barrier: No Cultural/Religious Beliefs Affecting Medical Care: No Physical Barrier Impaired Vision: Yes Glasses Impaired Hearing: No Decreased Hand dexterity: No Knowledge/Comprehension Knowledge Level: High Comprehension Level: High Ability to understand written instructions: High Ability to understand verbal instructions: High Motivation Anxiety Level: Calm Cooperation: Cooperative Education Importance: Acknowledges Need Interest in Health Problems: Asks Questions Perception: Coherent Willingness to Engage in Self-Management High Activities: Readiness to Engage in Self-Management High Activities: Electronic Signature(s) Signed: 06/30/2019 5:57:14 PM By: Deon Pilling Entered By: Deon Pilling on 06/30/2019 13:39:16 -------------------------------------------------------------------------------- Fall Risk Assessment Details Patient Name: Date of Service: Denise Ice NNE S. 06/30/2019 1:15 PM Medical Record Number: [id number] Patient Account Number: [id number] Date of Birth/Sex: Treating RN: 04-02-1945 (74 y.o. Denise Bailey, Denise Bailey Primary Care Shresta Risden: Marlena Clipper Other  Clinician: Referring Sidnie Swalley: Treating Nicholi Ghuman/Extender: Elliot Gurney, MELISA Weeks in Treatment: 0 Fall Risk Assessment Items Have you had 2 or more falls in the last 12 monthso 0 No Have you had any fall that resulted in injury in the last 12 monthso 0 No FALLS RISK SCREEN History of falling - immediate or within 3 months 0 No Secondary diagnosis (Do you have 2 or more medical diagnoseso) 0 No Ambulatory aid None/bed rest/wheelchair/nurse 0 No Crutches/cane/walker 0 No Furniture 0 No Intravenous therapy Access/Saline/Heparin Lock 0 No Gait/Transferring Normal/ bed rest/ wheelchair 0 No Weak (  short steps with or without shuffle, stooped but able to lift head while walking, may seek 10 Yes support from furniture) Impaired (short steps with shuffle, may have difficulty arising from chair, head down, impaired 0 No balance) Mental Status Oriented to own ability 0 Yes Electronic Signature(s) Signed: 06/30/2019 5:57:14 PM By: Deon Pilling Entered By: Deon Pilling on 06/30/2019 13:39:41 -------------------------------------------------------------------------------- Foot Assessment Details Patient Name: Date of Service: Denise Bailey, Denise NNE S. 06/30/2019 1:15 PM Medical Record Number: [id number] Patient Account Number: [id number] Date of Birth/Sex: Treating RN: 1945-12-17 (74 y.o. Denise Bailey Primary Care Maksymilian Mabey: Marlena Clipper Other Clinician: Referring Moussa Wiegand: Treating Jamerson Vonbargen/Extender: Elliot Gurney, MELISA Weeks in Treatment: 0 Foot Assessment Items Site Locations + = Sensation present, - = Sensation absent, C = Callus, U = Ulcer R = Redness, W = Warmth, M = Maceration, PU = Pre-ulcerative lesion F = Fissure, S = Swelling, D = Dryness Assessment Right: Left: Other Deformity: No No Prior Foot Ulcer: No No Prior Amputation: No No Charcot Joint: No No Ambulatory Status: Ambulatory With Help Assistance Device: Wheelchair Gait: Steady Electronic  Signature(s) Signed: 06/30/2019 5:57:14 PM By: Deon Pilling Entered By: Deon Pilling on 06/30/2019 13:51:42 -------------------------------------------------------------------------------- Nutrition Risk Screening Details Patient Name: Date of Service: Denise Bailey, Denise Bailey 06/30/2019 1:15 PM Medical Record Number: [id number] Patient Account Number: [id number] Date of Birth/Sex: Treating RN: [birth year] (74 y.o. Denise Bailey, Denise Bailey Primary Care Lakera Viall: Marlena Clipper Other Clinician: Referring Callie Facey: Treating Markiesha Delia/Extender: Elliot Gurney, MELISA Weeks in Treatment: 0 Height (in): 61 Weight (lbs): 105 Body Mass Index (BMI): 19.8 Nutrition Risk Screening Items Score Screening NUTRITION RISK SCREEN: I have an illness or condition that made me change the kind and/or amount of food I eat 2 Yes I eat fewer than two meals per day 0 No I eat few fruits and vegetables, or milk products 0 No I have three or more drinks of beer, liquor or wine almost every day 0 No I have tooth or mouth problems that make it hard for me to eat 0 No I don't always have enough money to buy the food I need 0 No I eat alone most of the time 0 No I take three or more different prescribed or over-the-counter drugs a day 1 Yes Without wanting to, I have lost or gained 10 pounds in the last six months 0 No I am not always physically able to shop, cook and/or feed myself 0 No Nutrition Protocols Good Risk Protocol Moderate Risk Protocol 0 Provide education on nutrition High Risk Proctocol Risk Level: Moderate Risk Score: 3 Electronic Signature(s) Signed: 06/30/2019 5:57:14 PM By: Deon Pilling Entered By: Deon Pilling on 06/30/2019 13:40:07

## 2019-06-30 NOTE — Progress Notes (Signed)
MCKINNA, DEMARS ([id number]) Visit Report for 06/30/2019 Chief Complaint Document Details Patient Name: Date of Service: ALARA, DANIEL 06/30/2019 1:15 PM Medical Record Number: [id number] Patient Account Number: [id number] Date of Birth/Sex: Treating RN: May 10, 1945 (74 y.o. Elam Dutch Primary Care Provider: Marlena Clipper Other Clinician: Referring Provider: Treating Provider/Extender: Elliot Gurney, MELISA Weeks in Treatment: 0 Information Obtained from: Patient Chief Complaint Left LE Ulcer Electronic Signature(s) Signed: 06/30/2019 2:35:16 PM By: Worthy Keeler PA-C Entered By: Worthy Keeler on 06/30/2019 14:35:15 -------------------------------------------------------------------------------- Debridement Details Patient Name: Date of Service: ASHBY, LEFLORE NNE S. 06/30/2019 1:15 PM Medical Record Number: [id number] Patient Account Number: [id number] Date of Birth/Sex: Treating RN: 12-07-1945 (74 y.o. Helene Shoe, Meta.Reding Primary Care Provider: Marlena Clipper Other Clinician: Referring Provider: Treating Provider/Extender: Elliot Gurney, MELISA Weeks in Treatment: 0 Debridement Performed for Assessment: Wound #1 Left,Distal,Anterior Lower Leg Performed By: Clinician Deon Pilling, RN Debridement Type: Chemical/Enzymatic/Mechanical Agent Used: gauze and wound cleanser Severity of Tissue Pre Debridement: Limited to breakdown of skin Level of Consciousness (Pre-procedure): Awake and Alert Pre-procedure Verification/Time Out Yes - 14:40 Taken: Start Time: 14:41 Bleeding: None End Time: 14:47 Procedural Pain: 0 Post Procedural Pain: 0 Response to Treatment: Procedure was tolerated well Level of Consciousness (Post- Awake and Alert procedure): Post Debridement Measurements of Total Wound Length: (cm) 0.8 Width: (cm) 0.8 Depth: (cm) 0.1 Volume: (cm) 0.05 Character of Wound/Ulcer Post Debridement: Improved Severity of Tissue Post Debridement: Limited to  breakdown of skin Post Procedure Diagnosis Same as Pre-procedure Electronic Signature(s) Signed: 06/30/2019 5:57:14 PM By: Deon Pilling Signed: 06/30/2019 6:29:31 PM By: Worthy Keeler PA-C Entered By: Deon Pilling on 06/30/2019 14:48:03 -------------------------------------------------------------------------------- HPI Details Patient Name: Date of Service: [name] 06/30/2019 1:15 PM Medical Record Number: [id number] Patient Account Number: [id number] Date of Birth/Sex: Treating RN: [birth year] (74 y.o. Elam Dutch Primary Care Provider: Marlena Clipper Other Clinician: Referring Provider: Treating Provider/Extender: Elliot Gurney, MELISA Weeks in Treatment: 0 History of Present Illness HPI Description: 06/30/2019 upon evaluation today patient presents for initial evaluation here in our clinic concerning issues that she has been having with ulcers over the bilateral lower extremities which has been present for some time. She is seeing her primary care provider she also subsequently has seen Dr[facility] He notes that she does have bilateral leg swelling which has developed over the past 4 to 5 months unfortunately she is not been able to easily get compression socks on. She has used diuretics and creams which have not helped according to his note. This was dated/26/21. Unfortunately he notes that there is really not much that can be done to improve her edema from a vascular standpoint. There is no procedural/laser ablation or other venous interventions that will be beneficial. He suggested referral to lymphedema clinic as well as compression stockings and that she may be getting a compression stocking but we are to try to help get them on. Subsequently the patient has also been seen by cardiology she does have severe aortic stenosis and needs a valve replacement apparently there is also "a hole in her heart although I am not really sure  that she completely understood that as I cannot find any mention other than the severe aortic stenosis at this point. Either way she needs surgery but they really want her legs to be under control before proceeding with that surgery. Lastly the patient did have a venous  study which again showed reflux especially on the left but again there is no intervention by vascular that can be attempted at this point that would be beneficial for the patient. Patient does have a history of chronic venous insufficiency, lymphedema, and aortic valve stenosis which is stated to be severe. Electronic Signature(s) Signed: 06/30/2019 3:42:56 PM By: Worthy Keeler PA-C Entered By: Worthy Keeler on 06/30/2019 15:42:56 -------------------------------------------------------------------------------- Physical Exam Details Patient Name: Date of Service: ALECE, KOPPEL NNE S. 06/30/2019 1:15 PM Medical Record Number: [id number] Patient Account Number: [id number] Date of Birth/Sex: Treating RN: [birth year] (74 y.o. Elam Dutch Primary Care Provider: Marlena Clipper Other Clinician: Referring Provider: Treating Provider/Extender: Elliot Gurney, MELISA Weeks in Treatment: 0 Constitutional sitting or standing blood pressure is within target range for patient.Marland Kitchen respirations regular, non-labored and within target range for patient.Marland Kitchen temperature within target range for patient.. Well-nourished and well-hydrated in no acute distress. Eyes conjunctiva clear no eyelid edema noted. pupils equal round and reactive to light and accommodation. Ears, Nose, Mouth, and Throat no gross abnormality of ear auricles or external auditory canals. normal hearing noted during conversation. mucus membranes moist. Respiratory normal breathing without difficulty. Cardiovascular 2+ dorsalis pedis/posterior tibialis pulses. 2+ pitting edema of the bilateral lower extremities. Musculoskeletal normal gait and posture. no significant  deformity or arthritic changes, no loss or range of motion, no clubbing. Psychiatric this patient is able to make decisions and demonstrates good insight into disease process. Alert and Oriented x 3. pleasant and cooperative. Notes Upon inspection patient's wound bed actually showed signs of good granulation at this time. Fortunately there does not appear to be any evidence of active infection. Most of the wounds appear to be very superficial and I do not see anything that seems to be likely an issue at this point in that regard. With that being said I do not see any evidence of cellulitis either but I do believe the patient likely needs to have very good compression in order to get these wounds both to heal as well as long-term keep them healed. Electronic Signature(s) Signed: 06/30/2019 3:44:51 PM By: Worthy Keeler PA-C Entered By: Worthy Keeler on 06/30/2019 15:44:51 -------------------------------------------------------------------------------- Physician Orders Details Patient Name: Date of Service: YARDLEY, BELTRAN NNE S. 06/30/2019 1:15 PM Medical Record Number: [id number] Patient Account Number: [id number] Date of Birth/Sex: Treating RN: 08/08/1945 (73 y.o. Helene Shoe, Meta.Reding Primary Care Provider: Marlena Clipper Other Clinician: Referring Provider: Treating Provider/Extender: Elliot Gurney, MELISA Weeks in Treatment: 0 Verbal / Phone Orders: No Diagnosis Coding ICD-10 Coding Code Description I87.2 Venous insufficiency (chronic) (peripheral) I89.0 Lymphedema, not elsewhere classified L97.822 Non-pressure chronic ulcer of other part of left lower leg with fat layer exposed I35.0 Nonrheumatic aortic (valve) stenosis Follow-up Appointments Return Appointment in 1 week. Dressing Change Frequency Do not change entire dressing for one week. Skin Barriers/Peri-Wound Care Moisturizing lotion - both legs in clinic Wound Cleansing May shower with protection. - patient to use cast  protector when showering. Do not get the dressing wet. Primary Wound Dressing Wound #1 Left,Distal,Anterior Lower Leg lginate - apply also in the posterior lower leg as well. Calcium A Secondary Dressing Dry Gauze Edema Control 3 Layer Compression System - Bilateral Avoid standing for long periods of time Elevate legs to the level of the heart or above for 30 minutes daily and/or when sitting, a frequency of: - throughout the day. Exercise regularly Support Garment 30-40 mm/Hg pressure to: - wound center to order  juxtalite compression garments for bilateral lower legs. Patient Medications llergies: No Known Drug Allergies A Notifications Medication Indication Start End lidocaine DOSE topical 4 % gel - gel topical applied prior to debridement by PA only used in clinic. Electronic Signature(s) Signed: 06/30/2019 5:57:14 PM By: Deon Pilling Signed: 06/30/2019 6:29:31 PM By: Worthy Keeler PA-C Entered By: Deon Pilling on 06/30/2019 14:46:45 Prescription 06/30/2019 -------------------------------------------------------------------------------- Gildardo Griffes. Worthy Keeler [place] Patient Name: Provider: 12-Sep-1945 [id number] Date of Birth: NPI#: [id number] Sex: DEA #: [phone] Phone #: License #: [place] Patient Address: [address] [place] [address] [place], [place] [address] [place],  [phone] Allergies Name Reaction Severity No Known Drug Allergies Medication Medication: Route: Strength: Form: lidocaine topical 4% gel Class: TOPICAL LOCAL ANESTHETICS Dose: Frequency / Time: Indication: gel topical applied prior to debridement by PA only used in clinic. Number of Refills: Number of Units: 0 Generic Substitution: Start Date: End Date: Administered at Facility: Substitution Permitted Yes Time Administered: Time Discontinued: Note to Pharmacy: Hand Signature: Date(s): Electronic  Signature(s) Signed: 06/30/2019 5:57:14 PM By: Deon Pilling Signed: 06/30/2019 6:29:31 PM By: Worthy Keeler PA-C Entered By: Deon Pilling on 06/30/2019 14:46:45 -------------------------------------------------------------------------------- Problem List Details Patient Name: Date of Service: [name] 06/30/2019 1:15 PM Medical Record Number: [id number] Patient Account Number: [id number] Date of Birth/Sex: Treating RN: [birth year] (74 y.o. Elam Dutch Primary Care Provider: Marlena Clipper Other Clinician: Referring Provider: Treating Provider/Extender: Elliot Gurney, MELISA Weeks in Treatment: 0 Active Problems ICD-10 Encounter Code Description Active Date MDM Diagnosis I87.2 Venous insufficiency (chronic) (peripheral) 06/30/2019 No Yes I89.0 Lymphedema, not elsewhere classified 06/30/2019 No Yes L97.822 Non-pressure chronic ulcer of other part of left lower leg with fat layer exposed6/09/2019 No Yes I35.0 Nonrheumatic aortic (valve) stenosis 06/30/2019 No Yes Inactive Problems Resolved Problems Electronic Signature(s) Signed: 06/30/2019 2:35:05 PM By: Worthy Keeler PA-C Entered By: Worthy Keeler on 06/30/2019 14:35:04 -------------------------------------------------------------------------------- Progress Note Details Patient Name: Date of Service: ETHER, GOEBEL NNE S. 06/30/2019 1:15 PM Medical Record Number: [id number] Patient Account Number: [id number] Date of Birth/Sex: Treating RN: 05/15/1945 (74 y.o. Elam Dutch Primary Care Provider: Marlena Clipper Other Clinician: Referring Provider: Treating Provider/Extender: Elliot Gurney, MELISA Weeks in Treatment: 0 Subjective Chief Complaint Information obtained from Patient Left LE Ulcer History of Present Illness (HPI) 06/30/2019 upon evaluation today patient presents for initial evaluation here in our clinic concerning issues that she has been having with ulcers over the bilateral lower  extremities which has been present for some time. She is seeing her primary care provider she also subsequently has [facility] He notes that she does have bilateral leg swelling which has developed over the past 4 to 5 months unfortunately she is not been able to easily get compression socks on. She has used diuretics and creams which have not helped according to his note. This was dated/26/21. Unfortunately he notes that there is really not much that can be done to improve her edema from a vascular standpoint. There is no procedural/laser ablation or other venous interventions that will be beneficial. He suggested referral to lymphedema clinic as well as compression stockings and that she may be getting a compression stocking but we are to try to help get them on. Subsequently the patient has also been seen by cardiology she does have severe aortic stenosis and needs a valve  replacement apparently there is also "a hole in her heart although I am not really sure that she completely understood that as I cannot find any mention other than the severe aortic stenosis at this point. Either way she needs surgery but they really want her legs to be under control before proceeding with that surgery. Lastly the patient did have a venous study which again showed reflux especially on the left but again there is no intervention by vascular that can be attempted at this point that would be beneficial for the patient. Patient does have a history of chronic venous insufficiency, lymphedema, and aortic valve stenosis which is stated to be severe. Patient History Information obtained from Patient. Allergies No Known Drug Allergies Family History Diabetes - Siblings, Hypertension - Siblings, No family history of Cancer, Heart Disease, Hereditary Spherocytosis, Kidney Disease, Lung Disease, Seizures, Stroke, Thyroid Problems, Tuberculosis. Social History Never smoker, Marital  Status - Married, Alcohol Use - Never, Drug Use - No History, Caffeine Use - Never. Medical History Eyes Denies history of Cataracts, Glaucoma, Optic Neuritis Ear/Nose/Mouth/Throat Denies history of Chronic sinus problems/congestion, Middle ear problems Hematologic/Lymphatic Denies history of Anemia, Hemophilia, Human Immunodeficiency Virus, Lymphedema, Sickle Cell Disease Respiratory Denies history of Aspiration, Asthma, Chronic Obstructive Pulmonary Disease (COPD), Pneumothorax, Sleep Apnea, Tuberculosis Cardiovascular Patient has history of Peripheral Venous Disease - seen Dr. Trula Slade 2021 Denies history of Angina, Arrhythmia, Congestive Heart Failure, Coronary Artery Disease, Deep Vein Thrombosis, Hypertension, Hypotension, Myocardial Infarction, Peripheral Arterial Disease, Vasculitis Gastrointestinal Denies history of Cirrhosis , Colitis, Crohnoos, Hepatitis A, Hepatitis B, Hepatitis C Endocrine Denies history of Type I Diabetes, Type II Diabetes Genitourinary Denies history of End Stage Renal Disease Immunological Denies history of Lupus Erythematosus, Raynaudoos, Scleroderma Integumentary (Skin) Denies history of History of Burn Musculoskeletal Denies history of Gout, Rheumatoid Arthritis, Osteoarthritis, Osteomyelitis Neurologic Denies history of Dementia, Neuropathy, Quadriplegia, Paraplegia, Seizure Disorder Oncologic Patient has history of Received Chemotherapy - 1995 Denies history of Received Radiation Psychiatric Denies history of Anorexia/bulimia, Confinement Anxiety Hospitalization/Surgery History - hysterectomy. - left breast mastectomy. Medical A Surgical History Notes nd Constitutional Symptoms (General Health) hypothyroidism Cardiovascular Per patient needs a heart valve replacement and was told has a hole in the back of her heart. Oncologic Left breast mastectomy 1995 Review of Systems (ROS) Constitutional Symptoms (General Health) Denies complaints  or symptoms of Fatigue, Fever, Chills, Marked Weight Change. Eyes Complains or has symptoms of Glasses / Contacts. Denies complaints or symptoms of Dry Eyes, Vision Changes. Ear/Nose/Mouth/Throat Denies complaints or symptoms of Chronic sinus problems or rhinitis. Respiratory Denies complaints or symptoms of Chronic or frequent coughs, Shortness of Breath. Cardiovascular Denies complaints or symptoms of Chest pain. Gastrointestinal Denies complaints or symptoms of Frequent diarrhea, Nausea, Vomiting. Genitourinary Denies complaints or symptoms of Frequent urination. Integumentary (Skin) Complains or has symptoms of Wounds - left leg. Musculoskeletal Denies complaints or symptoms of Muscle Pain, Muscle Weakness. Neurologic Denies complaints or symptoms of Numbness/parasthesias. Psychiatric Denies complaints or symptoms of Claustrophobia, Suicidal. Objective Constitutional sitting or standing blood pressure is within target range for patient.Marland Kitchen respirations regular, non-labored and within target range for patient.Marland Kitchen temperature within target range for patient.. Well-nourished and well-hydrated in no acute distress. Vitals Time Taken: 1:35 PM, Height: 61 in, Source: Stated, Weight: 105 lbs, Source: Stated, BMI: 19.8, Temperature: 98.6 F, Pulse: 86 bpm, Respiratory Rate: 18 breaths/min, Blood Pressure: 122/74 mmHg. Eyes conjunctiva clear no eyelid edema noted. pupils equal round and reactive to light and accommodation. Ears, Nose, Mouth, and Throat no  gross abnormality of ear auricles or external auditory canals. normal hearing noted during conversation. mucus membranes moist. Respiratory normal breathing without difficulty. Cardiovascular 2+ dorsalis pedis/posterior tibialis pulses. 2+ pitting edema of the bilateral lower extremities. Musculoskeletal normal gait and posture. no significant deformity or arthritic changes, no loss or range of motion, no clubbing. Psychiatric this  patient is able to make decisions and demonstrates good insight into disease process. Alert and Oriented x 3. pleasant and cooperative. General Notes: Upon inspection patient's wound bed actually showed signs of good granulation at this time. Fortunately there does not appear to be any evidence of active infection. Most of the wounds appear to be very superficial and I do not see anything that seems to be likely an issue at this point in that regard. With that being said I do not see any evidence of cellulitis either but I do believe the patient likely needs to have very good compression in order to get these wounds both to heal as well as long-term keep them healed. Integumentary (Hair, Skin) Wound #1 status is Open. Original cause of wound was Gradually Appeared. The wound is located on the [facility] Lower Leg. The wound measures 0.8cm length x 0.8cm width x 0.1cm depth; 0.503cm^2 area and 0.05cm^3 volume. There is Fat Layer (Subcutaneous Tissue) Exposed exposed. There is no tunneling or undermining noted. There is a medium amount of serosanguineous drainage noted. The wound margin is distinct with the outline attached to the wound base. There is large (67-100%) red, pink granulation within the wound bed. There is a small (1-33%) amount of necrotic tissue within the wound bed including Adherent Slough. Other Condition(s) Patient presents with Lymphedema located on the Right Leg. General Notes: cobblestone appearance to lower leg. Assessment Active Problems ICD-10 Venous insufficiency (chronic) (peripheral) Lymphedema, not elsewhere classified Non-pressure chronic ulcer of other part of left lower leg with fat layer exposed Nonrheumatic aortic (valve) stenosis Procedures Wound #1 Pre-procedure diagnosis of Wound #1 is a Lymphedema located on the Left,Distal,Anterior Lower Leg .Severity of Tissue Pre Debridement is: Limited to breakdown of skin. There was a  Chemical/Enzymatic/Mechanical debridement performed by Deon Pilling, RN.. Other agent used was gauze and wound cleanser. A time out was conducted at 14:40, prior to the start of the procedure. There was no bleeding. The procedure was tolerated well with a pain level of 0 throughout and a pain level of 0 following the procedure. Post Debridement Measurements: 0.8cm length x 0.8cm width x 0.1cm depth; 0.05cm^3 volume. Character of Wound/Ulcer Post Debridement is improved. Severity of Tissue Post Debridement is: Limited to breakdown of skin. Post procedure Diagnosis Wound #1: Same as Pre-Procedure Pre-procedure diagnosis of Wound #1 is a Lymphedema located on the Left,Distal,Anterior Lower Leg . There was a Three Layer Compression Therapy Procedure with a pre-treatment ABI of 1.2 by Carlene Coria, RN. Post procedure Diagnosis Wound #1: Same as Pre-Procedure There was a Three Layer Compression Therapy Procedure with a pre-treatment ABI of 1.2 by Carlene Coria, RN. Post procedure Diagnosis Wound #: Same as Pre-Procedure Plan Follow-up Appointments: Return Appointment in 1 week. Dressing Change Frequency: Do not change entire dressing for one week. Skin Barriers/Peri-Wound Care: Moisturizing lotion - both legs in clinic Wound Cleansing: May shower with protection. - patient to use cast protector when showering. Do not get the dressing wet. Primary Wound Dressing: Wound #1 Left,Distal,Anterior Lower Leg: Calcium Alginate - apply also in the posterior lower leg as well. Secondary Dressing: Dry Gauze Edema Control: 3 Layer Compression System -  Bilateral Avoid standing for long periods of time Elevate legs to the level of the heart or above for 30 minutes daily and/or when sitting, a frequency of: - throughout the day. Exercise regularly Support Garment 30-40 mm/Hg pressure to: - wound center to order juxtalite compression garments for bilateral lower legs. The following medication(s) was  prescribed: lidocaine topical 4 % gel gel topical applied prior to debridement by PA only used in clinic. was prescribed at facility 1 I would recommend currently that we go ahead and initiate treatment with a 3 layer compression wrap I think this is may be of utmost importance in trying to get the wounds completely closed as right now she just has weeping and leaking that occurs on the sides. 2. I am also can recommend at this time that the patient continue with elevation is much as possible we discussed in detail what exactly this meant and what she needed to do. 3. I would recommend silver alginate dressings to the open areas to help dry these up. 4. I would also recommend that if she starts to feel the swelling get too significant such as pain when she has the wraps on she needs to elevate her legs heart level or above for at least an hour to try to get the swelling down. Again our goal in trying to get her healed at this point is that she needs to have the aortic valve replaced but again they want her legs better before that time. We will see patient back for reevaluation in 1 week here in the clinic. If anything worsens or changes patient will contact our office for additional recommendations. Electronic Signature(s) Signed: 06/30/2019 3:46:04 PM By: Worthy Keeler PA-C Entered By: Worthy Keeler on 06/30/2019 15:46:03 -------------------------------------------------------------------------------- HxROS Details Patient Name: Date of Service: JULLIA, MULLIGAN NNE S. 06/30/2019 1:15 PM Medical Record Number: [id number] Patient Account Number: [id number] Date of Birth/Sex: Treating RN: [birth year] (74 y.o. Debby Bud Primary Care Provider: Marlena Clipper Other Clinician: Referring Provider: Treating Provider/Extender: Elliot Gurney, MELISA Weeks in Treatment: 0 Information Obtained From Patient Constitutional Symptoms (General Health) Complaints and Symptoms: Negative for:  Fatigue; Fever; Chills; Marked Weight Change Medical History: Past Medical History Notes: hypothyroidism Eyes Complaints and Symptoms: Positive for: Glasses / Contacts Negative for: Dry Eyes; Vision Changes Medical History: Negative for: Cataracts; Glaucoma; Optic Neuritis Ear/Nose/Mouth/Throat Complaints and Symptoms: Negative for: Chronic sinus problems or rhinitis Medical History: Negative for: Chronic sinus problems/congestion; Middle ear problems Respiratory Complaints and Symptoms: Negative for: Chronic or frequent coughs; Shortness of Breath Medical History: Negative for: Aspiration; Asthma; Chronic Obstructive Pulmonary Disease (COPD); Pneumothorax; Sleep Apnea; Tuberculosis Cardiovascular Complaints and Symptoms: Negative for: Chest pain Medical History: Positive for: Peripheral Venous Disease - seen Dr. Trula Slade 2021 Negative for: Angina; Arrhythmia; Congestive Heart Failure; Coronary Artery Disease; Deep Vein Thrombosis; Hypertension; Hypotension; Myocardial Infarction; Peripheral Arterial Disease; Vasculitis Past Medical History Notes: Per patient needs a heart valve replacement and was told has a hole in the back of her heart. Gastrointestinal Complaints and Symptoms: Negative for: Frequent diarrhea; Nausea; Vomiting Medical History: Negative for: Cirrhosis ; Colitis; Crohns; Hepatitis A; Hepatitis B; Hepatitis C Genitourinary Complaints and Symptoms: Negative for: Frequent urination Medical History: Negative for: End Stage Renal Disease Integumentary (Skin) Complaints and Symptoms: Positive for: Wounds - left leg Medical History: Negative for: History of Burn Musculoskeletal Complaints and Symptoms: Negative for: Muscle Pain; Muscle Weakness Medical History: Negative for: Gout; Rheumatoid Arthritis; Osteoarthritis; Osteomyelitis Neurologic Complaints and  Symptoms: Negative for: Numbness/parasthesias Medical History: Negative for: Dementia; Neuropathy;  Quadriplegia; Paraplegia; Seizure Disorder Psychiatric Complaints and Symptoms: Negative for: Claustrophobia; Suicidal Medical History: Negative for: Anorexia/bulimia; Confinement Anxiety Hematologic/Lymphatic Medical History: Negative for: Anemia; Hemophilia; Human Immunodeficiency Virus; Lymphedema; Sickle Cell Disease Endocrine Medical History: Negative for: Type I Diabetes; Type II Diabetes Immunological Medical History: Negative for: Lupus Erythematosus; Raynauds; Scleroderma Oncologic Medical History: Positive for: Received Chemotherapy - 1995 Negative for: Received Radiation Past Medical History Notes: Left breast mastectomy 1995 Immunizations Pneumococcal Vaccine: Received Pneumococcal Vaccination: Yes Implantable Devices None Hospitalization / Surgery History Type of Hospitalization/Surgery hysterectomy left breast mastectomy Family and Social History Cancer: No; Diabetes: Yes - Siblings; Heart Disease: No; Hereditary Spherocytosis: No; Hypertension: Yes - Siblings; Kidney Disease: No; Lung Disease: No; Seizures: No; Stroke: No; Thyroid Problems: No; Tuberculosis: No; Never smoker; Marital Status - Married; Alcohol Use: Never; Drug Use: No History; Caffeine Use: Never; Financial Concerns: No; Food, Clothing or Shelter Needs: No; Support System Lacking: No; Transportation Concerns: No Electronic Signature(s) Signed: 06/30/2019 5:57:14 PM By: Deon Pilling Signed: 06/30/2019 6:29:31 PM By: Worthy Keeler PA-C Entered By: Deon Pilling on 06/30/2019 13:47:51 -------------------------------------------------------------------------------- SuperBill Details Patient Name: Date of Service: [name] 06/30/2019 Medical Record Number: [id number] Patient Account Number: [id number] Date of Birth/Sex: Treating RN: [birth year] (74 y.o. Helene Shoe, Meta.Reding Primary Care Provider: Marlena Clipper Other Clinician: Referring Provider: Treating Provider/Extender: Elliot Gurney, MELISA Weeks in Treatment: 0 Diagnosis Coding ICD-10 Codes Code Description I87.2 Venous insufficiency (chronic) (peripheral) I89.0 Lymphedema, not elsewhere classified L97.822 Non-pressure chronic ulcer of other part of left lower leg with fat layer exposed I35.0 Nonrheumatic aortic (valve) stenosis Facility Procedures CPT4: Code 24825003 [id number] Description: 13 - WOUND CARE VISIT-LEV 3 EST PT Modifier: Quantity: 1 CPT4: 70488891 [id number] Description: 81 BILATERAL: Application of multi-layer venous compression system; leg (below knee), including ankle and t. Modifier: Quantity: 1 Physician Procedures : CPT4 Code Description Modifier [id number] - WC PHYS LEVEL 4 - NEW PT ICD-10 Diagnosis Description I87.2 Venous insufficiency (chronic) (peripheral) I89.0 Lymphedema, not elsewhere classified L97.822 Non-pressure chronic ulcer of other part of left  lower leg with fat layer exposed I35.0 Nonrheumatic aortic (valve) stenosis Quantity: 1 Electronic Signature(s) Signed: 06/30/2019 3:47:18 PM By: Worthy Keeler PA-C Entered By: Worthy Keeler on 06/30/2019 15:47:17

## 2019-06-30 NOTE — Progress Notes (Signed)
Denise Bailey, Denise Bailey ([id number]) Visit Report for 06/30/2019 Allergy List Details Patient Name: Date of Service: Denise Bailey, Denise Bailey 06/30/2019 1:15 PM Medical Record Number: [id number] Patient Account Number: [id number] Date of Birth/Sex: Treating RN: [birth year] (74 y.o. Debby Bud Primary Care Bannon Giammarco: Marlena Clipper Other Clinician: Referring Vaniyah Lansky: Treating Raia Amico/Extender: Elliot Gurney, MELISA Weeks in Treatment: 0 Allergies Active Allergies No Known Drug Allergies Allergy Notes Electronic Signature(s) Signed: 06/30/2019 5:57:14 PM By: Deon Pilling Entered By: Deon Pilling on 06/30/2019 13:37:30 -------------------------------------------------------------------------------- Arrival Information Details Patient Name: Date of Service: Denise Bailey, Denise NNE S. 06/30/2019 1:15 PM Medical Record Number: [id number] Patient Account Number: [id number] Date of Birth/Sex: Treating RN: [birth year] (74 y.o. Debby Bud Primary Care Bodhi Moradi: Marlena Clipper Other Clinician: Referring Romello Hoehn: Treating Eaven Schwager/Extender: Elliot Gurney, MELISA Weeks in Treatment: 0 Visit Information Patient Arrived: Wheel Chair Arrival Time: 13:32 Accompanied By: husband Transfer Assistance: None Patient Identification Verified: Yes Secondary Verification Process Completed: Yes Patient Requires Transmission-Based Precautions: No Patient Has Alerts: No Electronic Signature(s) Signed: 06/30/2019 5:57:14 PM By: Deon Pilling Entered By: Deon Pilling on 06/30/2019 13:36:11 -------------------------------------------------------------------------------- Clinic Level of Care Assessment Details Patient Name: Date of Service: Denise Bailey, Denise Bailey 06/30/2019 1:15 PM Medical Record Number: [id number] Patient Account Number: [id number] Date of Birth/Sex: Treating RN: [birth year] (74 y.o. Debby Bud Primary Care Finnlee Silvernail: Marlena Clipper Other Clinician: Referring Artha Stavros: Treating  Stacee Earp/Extender: Elliot Gurney, MELISA Weeks in Treatment: 0 Clinic Level of Care Assessment Items TOOL 1 Quantity Score X- 1 0 Use when EandM and Procedure is performed on INITIAL visit ASSESSMENTS - Nursing Assessment / Reassessment X- 1 20 General Physical Exam (combine w/ comprehensive assessment (listed just below) when performed on new pt. evals) X- 1 25 Comprehensive Assessment (HX, ROS, Risk Assessments, Wounds Hx, etc.) ASSESSMENTS - Wound and Skin Assessment / Reassessment X- 1 10 Dermatologic / Skin Assessment (not related to wound area) ASSESSMENTS - Ostomy and/or Continence Assessment and Care []  - 0 Incontinence Assessment and Management []  - 0 Ostomy Care Assessment and Management (repouching, etc.) PROCESS - Coordination of Care X - Simple Patient / Family Education for ongoing care 1 15 []  - 0 Complex (extensive) Patient / Family Education for ongoing care X- 1 10 Staff obtains Programmer, systems, Records, T Results / Process Orders est []  - 0 Staff telephones HHA, Nursing Homes / Clarify orders / etc []  - 0 Routine Transfer to another Facility (non-emergent condition) []  - 0 Routine Hospital Admission (non-emergent condition) X- 1 15 New Admissions / Biomedical engineer / Ordering NPWT Apligraf, etc. , []  - 0 Emergency Hospital Admission (emergent condition) PROCESS - Special Needs []  - 0 Pediatric / Minor Patient Management []  - 0 Isolation Patient Management []  - 0 Hearing / Language / Visual special needs []  - 0 Assessment of Community assistance (transportation, D/C planning, etc.) []  - 0 Additional assistance / Altered mentation []  - 0 Support Surface(s) Assessment (bed, cushion, seat, etc.) INTERVENTIONS - Miscellaneous []  - 0 External ear exam []  - 0 Patient Transfer (multiple staff / Civil Service fast streamer / Similar devices) []  - 0 Simple Staple / Suture removal (25 or less) []  - 0 Complex Staple / Suture removal (26 or more) []  -  0 Hypo/Hyperglycemic Management (do not check if billed separately) X- 1 15 Ankle / Brachial Index (ABI) - do not check if billed separately Has the patient been seen at the hospital within the last three years: Yes Total Score: 110 Level Of  Care: New/Established - Level 3 Electronic Signature(s) Signed: 06/30/2019 5:57:14 PM By: Deon Pilling Entered By: Deon Pilling on 06/30/2019 14:38:15 -------------------------------------------------------------------------------- Compression Therapy Details Patient Name: Date of Service: Denise Bailey, Denise NNE S. 06/30/2019 1:15 PM Medical Record Number: [id number] Patient Account Number: [id number] Date of Birth/Sex: Treating RN: 11/30/1945 (74 y.o. Debby Bud Primary Care Loral Campi: Marlena Clipper Other Clinician: Referring Honor Fairbank: Treating Jamiesha Victoria/Extender: Elliot Gurney, MELISA Weeks in Treatment: 0 Compression Therapy Performed for Wound Assessment: NonWound Condition Lymphedema - Right Leg Performed By: Clinician Carlene Coria, RN Compression Type: Three Layer Pre Treatment ABI: 1.2 Post Procedure Diagnosis Same as Pre-procedure Electronic Signature(s) Signed: 06/30/2019 5:57:14 PM By: Deon Pilling Entered By: Deon Pilling on 06/30/2019 14:45:20 -------------------------------------------------------------------------------- Compression Therapy Details Patient Name: Date of Service: Denise Bailey, Denise NNE S. 06/30/2019 1:15 PM Medical Record Number: [id number] Patient Account Number: [id number] Date of Birth/Sex: Treating RN: 1945-03-30 (74 y.o. Debby Bud Primary Care Olivea Sonnen: Marlena Clipper Other Clinician: Referring Charita Lindenberger: Treating Merrit Waugh/Extender: Elliot Gurney, MELISA Weeks in Treatment: 0 Compression Therapy Performed for Wound Assessment: Wound #1 Left,Distal,Anterior Lower Leg Performed By: Clinician Carlene Coria, RN Compression Type: Three Layer Pre Treatment ABI: 1.2 Post Procedure Diagnosis Same  as Pre-procedure Electronic Signature(s) Signed: 06/30/2019 5:57:14 PM By: Deon Pilling Entered By: Deon Pilling on 06/30/2019 14:45:28 -------------------------------------------------------------------------------- Lower Extremity Assessment Details Patient Name: Date of Service: Denise Bailey, Denise NNE S. 06/30/2019 1:15 PM Medical Record Number: [id number] Patient Account Number: [id number] Date of Birth/Sex: Treating RN: Jun 30, 1945 (74 y.o. Helene Shoe, Meta.Reding Primary Care Greig Altergott: Marlena Clipper Other Clinician: Referring Diyana Starrett: Treating Jaquavion Mccannon/Extender: Elliot Gurney, MELISA Weeks in Treatment: 0 Edema Assessment Assessed: [Left: Yes] [Right: Yes] Edema: [Left: Yes] [Right: Yes] Calf Left: Right: Point of Measurement: cm From Medial Instep 36 cm 35.8 cm Ankle Left: Right: Point of Measurement: cm From Medial Instep 20.8 cm 19.8 cm Vascular Assessment Pulses: Dorsalis Pedis Palpable: [Left:Yes] [Right:Yes] Doppler Audible: [Left:Yes] [Right:Yes] Posterior Tibial Palpable: [Left:Yes] [Right:Yes] Doppler Audible: [Left:Yes] [Right:Yes] Blood Pressure: Brachial: [Left:122] [Right:122] Ankle: [Left:Dorsalis Pedis: 150 1.23] [Right:Dorsalis Pedis: 142 1.16] Electronic Signature(s) Signed: 06/30/2019 5:57:14 PM By: Deon Pilling Entered By: Deon Pilling on 06/30/2019 13:49:20 -------------------------------------------------------------------------------- Multi-Disciplinary Care Plan Details Patient Name: Date of Service: Denise Bailey, Denise NNE S. 06/30/2019 1:15 PM Medical Record Number: [id number] Patient Account Number: [id number] Date of Birth/Sex: Treating RN: 07-20-1945 (74 y.o. Debby Bud Primary Care Gery Sabedra: Marlena Clipper Other Clinician: Referring Archibald Marchetta: Treating Leiland Mihelich/Extender: Elliot Gurney, MELISA Weeks in Treatment: 0 Active Inactive Abuse / Safety / Falls / Self Care Management Nursing Diagnoses: Potential for falls Goals: Patient  will remain injury free related to falls Date Initiated: 06/30/2019 Target Resolution Date: 08/19/2019 Goal Status: Active Interventions: Provide education on fall prevention Notes: Orientation to the Wound Care Program Nursing Diagnoses: Knowledge deficit related to the wound healing center program Goals: Patient/caregiver will verbalize understanding of the [facility] Program Date Initiated: 06/30/2019 Target Resolution Date: 07/30/2019 Goal Status: Active Interventions: Provide education on orientation to the wound center Notes: Venous Leg Ulcer Nursing Diagnoses: Potential for venous Insuffiency (use before diagnosis confirmed) Goals: Non-invasive venous studies are completed as ordered Date Initiated: 06/30/2019 Target Resolution Date: 07/30/2019 Goal Status: Active Interventions: Assess peripheral edema status every visit. Provide education on venous insufficiency Treatment Activities: Non-invasive vascular studies : 06/30/2019 Venous Duplex Doppler : 05/10/2019 Notes: Wound/Skin Impairment Nursing Diagnoses: Knowledge deficit related to ulceration/compromised skin integrity Goals: Patient/caregiver will verbalize understanding of skin care  regimen Date Initiated: 06/30/2019 Target Resolution Date: 07/30/2019 Goal Status: Active Interventions: Assess patient/caregiver ability to obtain necessary supplies Assess patient/caregiver ability to perform ulcer/skin care regimen upon admission and as needed Provide education on ulcer and skin care Treatment Activities: Skin care regimen initiated : 06/30/2019 Topical wound management initiated : 06/30/2019 Notes: Electronic Signature(s) Signed: 06/30/2019 5:57:14 PM By: Deon Pilling Entered By: Deon Pilling on 06/30/2019 14:37:14 -------------------------------------------------------------------------------- Non-Wound Condition Assessment Details Patient Name: Date of Service: Denise Bailey, Denise NNE S. 06/30/2019 1:15 PM Medical  Record Number: [id number] Patient Account Number: [id number] Date of Birth/Sex: Treating RN: 07-10-1945 (74 y.o. Debby Bud Primary Care Bayleigh Loflin: Marlena Clipper Other Clinician: Referring Shanay Woolman: Treating Terin Cragle/Extender: Elliot Gurney, MELISA Weeks in Treatment: 0 Non-Wound Condition: Condition: Lymphedema Location: Leg Side: Right Notes cobblestone appearance to lower leg. Electronic Signature(s) Signed: 06/30/2019 5:57:14 PM By: Deon Pilling Entered By: Deon Pilling on 06/30/2019 14:38:51 -------------------------------------------------------------------------------- Pain Assessment Details Patient Name: Date of Service: Denise Bailey, Denise NNE S. 06/30/2019 1:15 PM Medical Record Number: [id number] Patient Account Number: [id number] Date of Birth/Sex: Treating RN: [birth year] (74 y.o. Debby Bud Primary Care Sarinity Dicicco: Marlena Clipper Other Clinician: Referring Jennifer Payes: Treating Jaime Grizzell/Extender: Elliot Gurney, MELISA Weeks in Treatment: 0 Active Problems Location of Pain Severity and Description of Pain Patient Has Paino Yes Site Locations Pain Location: Pain in Ulcers Rate the pain. Current Pain Level: 4 Worst Pain Level: 10 Least Pain Level: 0 Tolerable Pain Level: 7 Pain Management and Medication Current Pain Management: Medication: No Cold Application: No Rest: No Massage: No Activity: No T.E.Bailey.S.: No Heat Application: No Leg drop or elevation: No Is the Current Pain Management Adequate: Adequate How does your wound impact your activities of daily livingo Sleep: No Bathing: No Appetite: No Relationship With Others: No Bladder Continence: No Emotions: No Bowel Continence: No Work: No Toileting: No Drive: No Dressing: No Hobbies: No Electronic Signature(s) Signed: 06/30/2019 5:57:14 PM By: Deon Pilling Entered By: Deon Pilling on 06/30/2019  13:40:54 -------------------------------------------------------------------------------- Patient/Caregiver Education Details Patient Name: Date of Service: Roxanna Mew 6/9/2021andnbsp1:15 PM Medical Record Number: [id number] Patient Account Number: [id number] Date of Birth/Gender: Treating RN: 1945-02-22 (74 y.o. Debby Bud Primary Care Physician: Marlena Clipper Other Clinician: Referring Physician: Treating Physician/Extender: Elliot Gurney, MELISA Weeks in Treatment: 0 Education Assessment Education Provided To: Patient Education Topics Provided Venous: Handouts: Controlling Swelling with Compression Stockings , Controlling Swelling with Multilayered Compression Wraps, Managing Venous Disease and Related Ulcers Methods: Explain/Verbal, Printed Responses: Reinforcements needed [place]: o Handouts: Welcome T [facility] o Methods: Explain/Verbal, Printed Responses: Reinforcements needed Electronic Signature(s) Signed: 06/30/2019 5:57:14 PM By: Deon Pilling Entered By: Deon Pilling on 06/30/2019 14:37:47 -------------------------------------------------------------------------------- Wound Assessment Details Patient Name: Date of Service: GIONNA, POLAK NNE S. 06/30/2019 1:15 PM Medical Record Number: [id number] Patient Account Number: [id number] Date of Birth/Sex: Treating RN: November 21, 1945 (74 y.o. Helene Shoe, Meta.Reding Primary Care Rigoberto Repass: Marlena Clipper Other Clinician: Referring Shagun Wordell: Treating Rhylei Mcquaig/Extender: Elliot Gurney, MELISA Weeks in Treatment: 0 Wound Status Wound Number: 1 Primary Etiology: Lymphedema Wound Location: Left, Distal, Anterior Lower Leg Secondary Etiology: Venous Leg Ulcer Wounding Event: Gradually Appeared Wound Status: Open Date Acquired: 06/30/2018 Comorbid History: Peripheral Venous Disease, Received Chemotherapy Weeks Of Treatment: 0 Clustered Wound: No Wound  Measurements Length: (cm) 0.8 Width: (cm) 0.8 Depth: (cm) 0.1 Area: (cm) 0.503 Volume: (cm) 0.05 % Reduction in Area: % Reduction in Volume: Epithelialization: None Tunneling:  No Undermining: No Wound Description Classification: Full Thickness Without Exposed Support Structures Wound Margin: Distinct, outline attached Exudate Amount: Medium Exudate Type: Serosanguineous Exudate Color: red, brown Foul Odor After Cleansing: No Slough/Fibrino Yes Wound Bed Granulation Amount: Large (67-100%) Exposed Structure Granulation Quality: Red, Pink Fascia Exposed: No Necrotic Amount: Small (1-33%) Fat Layer (Subcutaneous Tissue) Exposed: Yes Necrotic Quality: Adherent Slough Tendon Exposed: No Muscle Exposed: No Joint Exposed: No Bone Exposed: No Electronic Signature(s) Signed: 06/30/2019 5:57:14 PM By: Deon Pilling Entered By: Deon Pilling on 06/30/2019 13:51:05 -------------------------------------------------------------------------------- Vitals Details Patient Name: Date of Service: [name] 06/30/2019 1:15 PM Medical Record Number: [id number] Patient Account Number: [id number] Date of Birth/Sex: Treating RN: 16-Feb-1945 (74 y.o. Helene Shoe, Meta.Reding Primary Care Benzion Mesta: Marlena Clipper Other Clinician: Referring Cadell Gabrielson: Treating Sherita Decoste/Extender: Elliot Gurney, MELISA Weeks in Treatment: 0 Vital Signs Time Taken: 13:35 Temperature (F): 98.6 Height (in): 61 Pulse (bpm): 86 Source: Stated Respiratory Rate (breaths/min): 18 Weight (lbs): 105 Blood Pressure (mmHg): 122/74 Source: Stated Reference Range: 80 - 120 mg / dl Body Mass Index (BMI): 19.8 Electronic Signature(s) Signed: 06/30/2019 5:57:14 PM By: Deon Pilling Entered By: Deon Pilling on 06/30/2019 13:37:22

## 2019-07-06 ENCOUNTER — Encounter (HOSPITAL_BASED_OUTPATIENT_CLINIC_OR_DEPARTMENT_OTHER): Payer: PPO | Admitting: Internal Medicine

## 2019-07-06 ENCOUNTER — Other Ambulatory Visit: Payer: Self-pay

## 2019-07-06 DIAGNOSIS — L97822 Non-pressure chronic ulcer of other part of left lower leg with fat layer exposed: Secondary | ICD-10-CM | POA: Diagnosis not present

## 2019-07-06 DIAGNOSIS — I872 Venous insufficiency (chronic) (peripheral): Secondary | ICD-10-CM | POA: Diagnosis not present

## 2019-07-06 DIAGNOSIS — I890 Lymphedema, not elsewhere classified: Secondary | ICD-10-CM | POA: Diagnosis not present

## 2019-07-07 NOTE — Progress Notes (Signed)
Denise, Bailey ([id number]) Visit Report for 07/06/2019 Debridement Details Patient Name: Date of Service: Denise Bailey, Denise Bailey 07/06/2019 8:30 A M Medical Record Number: [id number] Patient Account Number: [id number] Date of Birth/Sex: Treating RN: July 12, 1945 (74 y.o. Orvan Falconer Primary Care Provider: Marlena Clipper Other Clinician: Referring Provider: Treating Provider/Extender: Pollyann Glen, MELISA Weeks in Treatment: 0 Debridement Performed for Assessment: Wound #1 Left,Distal,Anterior Lower Leg Performed By: Physician Ricard Dillon., MD Debridement Type: Debridement Severity of Tissue Pre Debridement: Fat layer exposed Level of Consciousness (Pre-procedure): Awake and Alert Pre-procedure Verification/Time Out Yes - 09:23 Taken: Start Time: 09:23 T Area Debrided (L x W): otal 1.2 (cm) x 1.3 (cm) = 1.56 (cm) Tissue and other material debrided: Viable, Non-Viable, Slough, Subcutaneous, Skin: Dermis , Skin: Epidermis, Slough Level: Skin/Subcutaneous Tissue Debridement Description: Excisional Instrument: Curette Bleeding: Moderate Hemostasis Achieved: Pressure End Time: 09:26 Procedural Pain: 0 Post Procedural Pain: 0 Response to Treatment: Procedure was tolerated well Level of Consciousness (Post- Awake and Alert procedure): Post Debridement Measurements of Total Wound Length: (cm) 1.2 Width: (cm) 1.3 Depth: (cm) 0.1 Volume: (cm) 0.123 Character of Wound/Ulcer Post Debridement: Improved Severity of Tissue Post Debridement: Fat layer exposed Post Procedure Diagnosis Same as Pre-procedure Electronic Signature(s) Signed: 07/06/2019 5:55:30 PM By: Linton Ham MD Signed: 07/07/2019 9:54:21 AM By: Carlene Coria RN Entered By: Linton Ham on 07/06/2019 09:29:10 -------------------------------------------------------------------------------- HPI Details Patient Name: Date of Service: Denise Ice NNE S. 07/06/2019 8:30 A M Medical Record Number:  [id number] Patient Account Number: [id number] Date of Birth/Sex: Treating RN: Aug 02, 1945 (74 y.o. Orvan Falconer Primary Care Provider: Marlena Clipper Other Clinician: Referring Provider: Treating Provider/Extender: Pollyann Glen, MELISA Weeks in Treatment: 0 History of Present Illness HPI Description: 06/30/2019 upon evaluation today patient presents for initial evaluation here in our clinic concerning issues that she has been having with ulcers over the bilateral lower extremities which has been present for some time. She is seeing her primary care provider she also subsequently has seen Dr[facility] He notes that she does have bilateral leg swelling which has developed over the past 4 to 5 months unfortunately she is not been able to easily get compression socks on. She has used diuretics and creams which have not helped according to his note. This was dated/26/21. Unfortunately he notes that there is really not much that can be done to improve her edema from a vascular standpoint. There is no procedural/laser ablation or other venous interventions that will be beneficial. He suggested referral to lymphedema clinic as well as compression stockings and that she may be getting a compression stocking but we are to try to help get them on. Subsequently the patient has also been seen by cardiology she does have severe aortic stenosis and needs a valve replacement apparently there is also "a hole in her heart although I am not really sure that she completely understood that as I cannot find any mention other than the severe aortic stenosis at this point. Either way she needs surgery but they really want her legs to be under control before proceeding with that surgery. Lastly the patient did have a venous study which again showed reflux especially on the left but again there is no intervention by vascular that can be attempted at this point that would be beneficial  for the patient. Patient does have a history of chronic venous insufficiency, lymphedema, and aortic valve stenosis which is stated to be severe. 6/15; patient  admitted the clinic last week. She has chronic venous insufficiency with secondary lymphedema. She has a wound on the left anterior lower leg and today came in with a new area on the right posterior calf. We had put her in 3 layer compression bilaterally. The most pressing issue for the patient is that she is being considered for valvular heart surgery which cannot go on until the wounds are healed on her lower extremities. She tells me that she has support stockings at home but had a lot of trouble getting them on even with the help of her husband. According to our notes we ordered her juxta lite stockings but she did not hear from anybody in the last week. Electronic Signature(s) Signed: 07/06/2019 5:55:30 PM By: Linton Ham MD Entered By: Linton Ham on 07/06/2019 09:30:35 -------------------------------------------------------------------------------- Physical Exam Details Patient Name: Date of Service: Denise Ice NNE S. 07/06/2019 8:30 A M Medical Record Number: [id number] Patient Account Number: [id number] Date of Birth/Sex: Treating RN: May 16, 1945 (74 y.o. Orvan Falconer Primary Care Provider: Marlena Clipper Other Clinician: Referring Provider: Treating Provider/Extender: Pollyann Glen, MELISA Weeks in Treatment: 0 Constitutional Patient is hypertensive.. Pulse regular and within target range for patient.Marland Kitchen Respirations regular, non-labored and within target range.. Temperature is normal and within the target range for the patient.Marland Kitchen Appears in no distress. Cardiovascular Pedal pulses are palpable. We have good edema control.. Notes Wound exam; the patient's wound on the left anterior leg covered with surface eschar. On the right posterior calf there is no debris that required debridement. Electronic  Signature(s) Signed: 07/06/2019 5:55:30 PM By: Linton Ham MD Entered By: Linton Ham on 07/06/2019 09:31:50 -------------------------------------------------------------------------------- Physician Orders Details Patient Name: Date of Service: Denise Ice NNE S. 07/06/2019 8:30 A M Medical Record Number: [id number] Patient Account Number: [id number] Date of Birth/Sex: Treating RN: 02/27/1945 (74 y.o. Orvan Falconer Primary Care Provider: Marlena Clipper Other Clinician: Referring Provider: Treating Provider/Extender: Pollyann Glen, MELISA Weeks in Treatment: 0 Verbal / Phone Orders: No Diagnosis Coding ICD-10 Coding Code Description I87.2 Venous insufficiency (chronic) (peripheral) I89.0 Lymphedema, not elsewhere classified L97.822 Non-pressure chronic ulcer of other part of left lower leg with fat layer exposed I35.0 Nonrheumatic aortic (valve) stenosis Follow-up Appointments Return Appointment in 1 week. Dressing Change Frequency Do not change entire dressing for one week. Skin Barriers/Peri-Wound Care Moisturizing lotion - both legs in clinic Wound Cleansing May shower with protection. - patient to use cast protector when showering. Do not get the dressing wet. Primary Wound Dressing Wound #1 Left,Distal,Anterior Lower Leg Calcium Alginate Wound #2 Right,Posterior Lower Leg Calcium Alginate Secondary Dressing Wound #1 Left,Distal,Anterior Lower Leg Dry Gauze Wound #2 Right,Posterior Lower Leg Dry Gauze Edema Control 3 Layer Compression System - Bilateral Avoid standing for long periods of time Elevate legs to the level of the heart or above for 30 minutes daily and/or when sitting, a frequency of: - throughout the day. Exercise regularly Support Garment 30-40 mm/Hg pressure to: - wound center to order juxtalite compression garments for bilateral lower legs. Electronic Signature(s) Signed: 07/06/2019 5:55:30 PM By: Linton Ham MD Signed: 07/07/2019  9:54:21 AM By: Carlene Coria RN Entered By: Carlene Coria on 07/06/2019 09:27:35 -------------------------------------------------------------------------------- Problem List Details Patient Name: Date of Service: Denise Ice NNE S. 07/06/2019 8:30 A M Medical Record Number: [id number] Patient Account Number: [id number] Date of Birth/Sex: Treating RN: 23-Mar-1945 (74 y.o. Orvan Falconer Primary Care Provider: Marlena Clipper Other Clinician: Referring Provider: Treating Provider/Extender: Pollyann Glen, MELISA [place]  in Treatment: 0 Active Problems ICD-10 Encounter Code Description Active Date MDM Diagnosis I87.2 Venous insufficiency (chronic) (peripheral) 06/30/2019 No Yes I89.0 Lymphedema, not elsewhere classified 06/30/2019 No Yes L97.822 Non-pressure chronic ulcer of other part of left lower leg with fat layer exposed6/09/2019 No Yes I35.0 Nonrheumatic aortic (valve) stenosis 06/30/2019 No Yes Inactive Problems Resolved Problems Electronic Signature(s) Signed: 07/06/2019 5:55:30 PM By: Linton Ham MD Entered By: Linton Ham on 07/06/2019 09:28:54 -------------------------------------------------------------------------------- Progress Note Details Patient Name: Date of Service: Denise Ice NNE S. 07/06/2019 8:30 A M Medical Record Number: [id number] Patient Account Number: [id number] Date of Birth/Sex: Treating RN: 1945-08-07 (74 y.o. Orvan Falconer Primary Care Provider: Marlena Clipper Other Clinician: Referring Provider: Treating Provider/Extender: Pollyann Glen, MELISA Weeks in Treatment: 0 Subjective History of Present Illness (HPI) 06/30/2019 upon evaluation today patient presents for initial evaluation here in our clinic concerning issues that she has been having with ulcers over the bilateral lower extremities which has been present for some time. She is seeing her primary care provider she also subsequently has [facility] He notes that she does have bilateral leg swelling which has developed over the past 4 to 5 months unfortunately she is not been able to easily get compression socks on. She has used diuretics and creams which have not helped according to his note. This was dated/26/21. Unfortunately he notes that there is really not much that can be done to improve her edema from a vascular standpoint. There is no procedural/laser ablation or other venous interventions that will be beneficial. He suggested referral to lymphedema clinic as well as compression stockings and that she may be getting a compression stocking but we are to try to help get them on. Subsequently the patient has also been seen by cardiology she does have severe aortic stenosis and needs a valve replacement apparently there is also "a hole in her heart although I am not really sure that she completely understood that as I cannot find any mention other than the severe aortic stenosis at this point. Either way she needs surgery but they really want her legs to be under control before proceeding with that surgery. Lastly the patient did have a venous study which again showed reflux especially on the left but again there is no intervention by vascular that can be attempted at this point that would be beneficial for the patient. Patient does have a history of chronic venous insufficiency, lymphedema, and aortic valve stenosis which is stated to be severe. 6/15; patient admitted the clinic last week. She has chronic venous insufficiency with secondary lymphedema. She has a wound on the left anterior lower leg and today came in with a new area on the right posterior calf. We had put her in 3 layer compression bilaterally. The most pressing issue for the patient is that she is being considered for valvular heart surgery which cannot go on until the wounds are healed on her lower extremities. She tells me that she has support stockings at home  but had a lot of trouble getting them on even with the help of her husband. According to our notes we ordered her juxta lite stockings but she did not hear from anybody in the last week. Objective Constitutional Patient is hypertensive.. Pulse regular and within target range for patient.Marland Kitchen Respirations regular, non-labored and within target range.. Temperature is normal and within the target range for the patient.Marland Kitchen Appears in no distress. Vitals Time Taken: 8:34  AM, Height: 61 in, Source: Stated, Weight: 105 lbs, Source: Stated, BMI: 19.8, Temperature: 98.3 F, Pulse: 82 bpm, Respiratory Rate: 18 breaths/min, Blood Pressure: 152/68 mmHg. Cardiovascular Pedal pulses are palpable. We have good edema control.. General Notes: Wound exam; the patient's wound on the left anterior leg covered with surface eschar. On the right posterior calf there is no debris that required debridement. Integumentary (Hair, Skin) Wound #1 status is Open. Original cause of wound was Gradually Appeared. The wound is located on the [facility] Lower Leg. The wound measures 1.2cm length x 1.3cm width x 0.1cm depth; 1.225cm^2 area and 0.123cm^3 volume. There is Fat Layer (Subcutaneous Tissue) Exposed exposed. There is no tunneling or undermining noted. There is a medium amount of serous drainage noted. The wound margin is flat and intact. There is large (67-100%) red granulation within the wound bed. There is no necrotic tissue within the wound bed. Wound #2 status is Open. Original cause of wound was Blister. The wound is located on the Right,Posterior Lower Leg. The wound measures 0.3cm length x 0.3cm width x 0.1cm depth; 0.071cm^2 area and 0.007cm^3 volume. The wound is limited to skin breakdown. There is no tunneling or undermining noted. There is a medium amount of serous drainage noted. The wound margin is flat and intact. There is large (67-100%) pink granulation within the wound bed. There is no necrotic  tissue within the wound bed. Assessment Active Problems ICD-10 Venous insufficiency (chronic) (peripheral) Lymphedema, not elsewhere classified Non-pressure chronic ulcer of other part of left lower leg with fat layer exposed Nonrheumatic aortic (valve) stenosis Procedures Wound #1 Pre-procedure diagnosis of Wound #1 is a Lymphedema located on the Left,Distal,Anterior Lower Leg .Severity of Tissue Pre Debridement is: Fat layer exposed. There was a Excisional Skin/Subcutaneous Tissue Debridement with a total area of 1.56 sq cm performed by Ricard Dillon., MD. With the following instrument(s): Curette to remove Viable and Non-Viable tissue/material. Material removed includes Subcutaneous Tissue, Slough, Skin: Dermis, and Skin: Epidermis. No specimens were taken. A time out was conducted at 09:23, prior to the start of the procedure. A Moderate amount of bleeding was controlled with Pressure. The procedure was tolerated well with a pain level of 0 throughout and a pain level of 0 following the procedure. Post Debridement Measurements: 1.2cm length x 1.3cm width x 0.1cm depth; 0.123cm^3 volume. Character of Wound/Ulcer Post Debridement is improved. Severity of Tissue Post Debridement is: Fat layer exposed. Post procedure Diagnosis Wound #1: Same as Pre-Procedure Plan Follow-up Appointments: Return Appointment in 1 week. Dressing Change Frequency: Do not change entire dressing for one week. Skin Barriers/Peri-Wound Care: Moisturizing lotion - both legs in clinic Wound Cleansing: May shower with protection. - patient to use cast protector when showering. Do not get the dressing wet. Primary Wound Dressing: Wound #1 Left,Distal,Anterior Lower Leg: Calcium Alginate Wound #2 Right,Posterior Lower Leg: Calcium Alginate Secondary Dressing: Wound #1 Left,Distal,Anterior Lower Leg: Dry Gauze Wound #2 Right,Posterior Lower Leg: Dry Gauze Edema Control: 3 Layer Compression System -  Bilateral Avoid standing for long periods of time Elevate legs to the level of the heart or above for 30 minutes daily and/or when sitting, a frequency of: - throughout the day. Exercise regularly Support Garment 30-40 mm/Hg pressure to: - wound center to order juxtalite compression garments for bilateral lower legs. 1. 3 layer compression system bilaterally 2 calcium alginate to both wound areas 3. I do not see these areas as being that problematic. 4. We ordered her juxta lite stockings last week  but she did not hear anything we will look into this Electronic Signature(s) Signed: 07/06/2019 5:55:30 PM By: Linton Ham MD Signed: 07/06/2019 5:55:30 PM By: Linton Ham MD Entered By: Linton Ham on 07/06/2019 09:32:52 -------------------------------------------------------------------------------- SuperBill Details Patient Name: Date of Service: Denise Ice NNE S. 07/06/2019 Medical Record Number: [id number] Patient Account Number: [id number] Date of Birth/Sex: Treating RN: Jun 23, 1945 (74 y.o. Orvan Falconer Primary Care Provider: Marlena Clipper Other Clinician: Referring Provider: Treating Provider/Extender: Pollyann Glen, MELISA Weeks in Treatment: 0 Diagnosis Coding ICD-10 Codes Code Description I87.2 Venous insufficiency (chronic) (peripheral) I89.0 Lymphedema, not elsewhere classified L97.822 Non-pressure chronic ulcer of other part of left lower leg with fat layer exposed I35.0 Nonrheumatic aortic (valve) stenosis L97.211 Non-pressure chronic ulcer of right calf limited to breakdown of skin Facility Procedures CPT4 Code: [id number] Description: 94174 - DEB SUBQ TISSUE 20 SQ CM/< ICD-10 Diagnosis Description L97.822 Non-pressure chronic ulcer of other part of left lower leg with fat layer expo Modifier: sed Quantity: 1 Physician Procedures : CPT4 Code Description Modifier [id number] - WC PHYS SUBQ TISS 20 SQ CM ICD-10 Diagnosis Description L97.822  Non-pressure chronic ulcer of other part of left lower leg with fat layer exposed Quantity: 1 Electronic Signature(s) Signed: 07/06/2019 5:55:30 PM By: Linton Ham MD Entered By: Linton Ham on 07/06/2019 09:33:44

## 2019-07-07 NOTE — Progress Notes (Signed)
Denise Bailey, Denise Bailey ([id number]) Visit Report for 07/06/2019 Arrival Information Details Patient Name: Date of Service: Denise Bailey, Denise Bailey. 07/06/2019 8:30 A M Medical Record Number: [id number] Patient Account Number: [id number] Date of Birth/Sex: Treating RN: [birth year] (74 y.o. Elam Dutch Primary Care Ahmir Bracken: Marlena Clipper Other Clinician: Referring Rylin Saez: Treating Kelliann Pendergraph/Extender: Pollyann Glen, MELISA Weeks in Treatment: 0 Visit Information History Since Last Visit Added or deleted any medications: No Patient Arrived: Wheel Chair Any new allergies or adverse reactions: No Arrival Time: 08:25 Had a fall or experienced change in No Accompanied By: spouse activities of daily living that may affect Transfer Assistance: None risk of falls: Patient Identification Verified: Yes Signs or symptoms of abuse/neglect since last visito No Secondary Verification Process Completed: Yes Hospitalized since last visit: No Patient Requires Transmission-Based Precautions: No Implantable device outside of the clinic excluding No Patient Has Alerts: No cellular tissue based products placed in the center since last visit: Has Dressing in Place as Prescribed: Yes Has Compression in Place as Prescribed: Yes Pain Present Now: Yes Electronic Signature(s) Signed: 07/06/2019 5:45:30 PM By: Baruch Gouty RN, BSN Entered By: Baruch Gouty on 07/06/2019 08:31:19 -------------------------------------------------------------------------------- Encounter Discharge Information Details Patient Name: Date of Service: Denise Ice NNE S. 07/06/2019 8:30 A M Medical Record Number: [id number] Patient Account Number: [id number] Date of Birth/Sex: Treating RN: 21-Sep-1945 (74 y.o. Debby Bud Primary Care Alee Gressman: Marlena Clipper Other Clinician: Referring Hennessey Cantrell: Treating Raenah Murley/Extender: Pollyann Glen, MELISA Weeks in Treatment: 0 Encounter Discharge Information Items Post  Procedure Vitals Discharge Condition: Stable Temperature (F): 98.3 Ambulatory Status: Wheelchair Pulse (bpm): 82 Discharge Destination: Home Respiratory Rate (breaths/min): 18 Transportation: Private Auto Blood Pressure (mmHg): 152/68 Accompanied By: caregiver Schedule Follow-up Appointment: Yes Clinical Summary of Care: Electronic Signature(s) Signed: 07/06/2019 5:41:07 PM By: Deon Pilling Entered By: Deon Pilling on 07/06/2019 10:49:55 -------------------------------------------------------------------------------- Lower Extremity Assessment Details Patient Name: Date of Service: Denise Bailey, Denise NNE S. 07/06/2019 8:30 A M Medical Record Number: [id number] Patient Account Number: [id number] Date of Birth/Sex: Treating RN: Jun 16, 1945 (74 y.o. Elam Dutch Primary Care Leisl Spurrier: Marlena Clipper Other Clinician: Referring Humzah Harty: Treating Shawntina Diffee/Extender: Pollyann Glen, MELISA Weeks in Treatment: 0 Edema Assessment Assessed: [Left: No] [Right: No] Edema: [Left: Yes] [Right: Yes] Calf Left: Right: Point of Measurement: cm From Medial Instep 32 cm 32.7 cm Ankle Left: Right: Point of Measurement: cm From Medial Instep 20.6 cm 19.5 cm Vascular Assessment Pulses: Dorsalis Pedis Palpable: [Left:Yes] [Right:Yes] Electronic Signature(s) Signed: 07/06/2019 5:45:30 PM By: Baruch Gouty RN, BSN Entered By: Baruch Gouty on 07/06/2019 08:48:22 -------------------------------------------------------------------------------- Multi Wound Chart Details Patient Name: Date of Service: Denise Ice NNE S. 07/06/2019 8:30 A M Medical Record Number: [id number] Patient Account Number: [id number] Date of Birth/Sex: Treating RN: Jan 28, 1945 (74 y.o. Orvan Falconer Primary Care Camara Rosander: Marlena Clipper Other Clinician: Referring Pharrah Rottman: Treating Vickki Igou/Extender: Pollyann Glen, MELISA Weeks in Treatment: 0 Vital Signs Height(in): 61 Pulse(bpm): 82 Weight(lbs):  105 Blood Pressure(mmHg): 152/68 Body Mass Index(BMI): 20 Temperature(F): 98.3 Respiratory Rate(breaths/min): 18 Photos: [1:No Photos Left, Distal, Anterior Lower Leg] [2:No Photos Right, Posterior Lower Leg] [N/A:N/A N/A] Wound Location: [1:Gradually Appeared] [2:Blister] [N/A:N/A] Wounding Event: [1:Lymphedema] [2:Venous Leg Ulcer] [N/A:N/A] Primary Etiology: [1:Venous Leg Ulcer] [2:N/A] [N/A:N/A] Secondary Etiology: [1:Peripheral Venous Disease, Received] [2:Peripheral Venous Disease, Received] [N/A:N/A] Comorbid History: [1:Chemotherapy 06/30/2018] [2:Chemotherapy 07/06/2019] [N/A:N/A] Date Acquired: [1:0] [2:0] [N/A:N/A] Weeks of Treatment: [1:Open] [2:Open] [N/A:N/A] Wound Status: [1:1.2x1.3x0.1] [2:0.3x0.3x0.1] [N/A:N/A] Measurements L x W x D (cm) [1:1.225] [2:0.071] [N/A:N/A] A (  cm) : rea [1:0.123] [2:0.007] [N/A:N/A] Volume (cm) : [1:-143.50%] [2:N/A] [N/A:N/A] % Reduction in Area: [1:-146.00%] [2:N/A] [N/A:N/A] % Reduction in Volume: [1:Full Thickness Without Exposed] [2:Partial Thickness] [N/A:N/A] Classification: [1:Support Structures Medium] [2:Medium] [N/A:N/A] Exudate Amount: [1:Serous] [2:Serous] [N/A:N/A] Exudate Type: [1:amber] [2:amber] [N/A:N/A] Exudate Color: [1:Flat and Intact] [2:Flat and Intact] [N/A:N/A] Wound Margin: [1:Large (67-100%)] [2:Large (67-100%)] [N/A:N/A] Granulation Amount: [1:Red] [2:Pink] [N/A:N/A] Granulation Quality: [1:None Present (0%)] [2:None Present (0%)] [N/A:N/A] Necrotic Amount: [1:Fat Layer (Subcutaneous Tissue)] [2:Fascia: No] [N/A:N/A] Exposed Structures: [1:Exposed: Yes Fascia: No Tendon: No Muscle: No Joint: No Bone: No Small (1-33%)] [2:Fat Layer (Subcutaneous Tissue) Exposed: No Tendon: No Muscle: No Joint: No Bone: No Limited to Skin Breakdown Medium (34-66%)] [N/A:N/A] Epithelialization: [1:Debridement - Excisional] [2:N/A] [N/A:N/A] Debridement: [1:09:23] [2:N/A] [N/A:N/A] Pre-procedure Verification/Time Out Taken:  [1:Subcutaneous, Slough] [2:N/A] [N/A:N/A] Tissue Debrided: [1:Skin/Subcutaneous Tissue] [2:N/A] [N/A:N/A] Level: [1:1.56] [2:N/A] [N/A:N/A] Debridement A (sq cm): [1:rea Curette] [2:N/A] [N/A:N/A] Instrument: [1:Moderate] [2:N/A] [N/A:N/A] Bleeding: [1:Pressure] [2:N/A] [N/A:N/A] Hemostasis A chieved: [1:0] [2:N/A] [N/A:N/A] Procedural Pain: [1:0] [2:N/A] [N/A:N/A] Post Procedural Pain: [1:Procedure was tolerated well] [2:N/A] [N/A:N/A] Debridement Treatment Response: [1:1.2x1.3x0.1] [2:N/A] [N/A:N/A] Post Debridement Measurements L x W x D (cm) [1:0.123] [2:N/A] [N/A:N/A] Post Debridement Volume: (cm) [1:Debridement] [2:N/A] [N/A:N/A] Treatment Notes Electronic Signature(s) Signed: 07/06/2019 5:55:30 PM By: Linton Ham MD Signed: 07/07/2019 9:54:21 AM By: Carlene Coria RN Entered By: Linton Ham on 07/06/2019 09:29:02 -------------------------------------------------------------------------------- Multi-Disciplinary Care Plan Details Patient Name: Date of Service: Denise Ice NNE S. 07/06/2019 8:30 A M Medical Record Number: [id number] Patient Account Number: [id number] Date of Birth/Sex: Treating RN: [birth year] (74 y.o. Orvan Falconer Primary Care Calley Drenning: Marlena Clipper Other Clinician: Referring Noboru Bidinger: Treating Suanne Minahan/Extender: Pollyann Glen, MELISA Weeks in Treatment: 0 Active Inactive Abuse / Safety / Falls / Self Care Management Nursing Diagnoses: Potential for falls Goals: Patient will remain injury free related to falls Date Initiated: 06/30/2019 Target Resolution Date: 08/19/2019 Goal Status: Active Interventions: Provide education on fall prevention Notes: Orientation to the Wound Care Program Nursing Diagnoses: Knowledge deficit related to the wound healing center program Goals: Patient/caregiver will verbalize understanding of the [facility] Date Initiated: 06/30/2019 Target Resolution Date: 07/30/2019 Goal Status:  Active Interventions: Provide education on orientation to the wound center Notes: Venous Leg Ulcer Nursing Diagnoses: Potential for venous Insuffiency (use before diagnosis confirmed) Goals: Non-invasive venous studies are completed as ordered Date Initiated: 06/30/2019 Target Resolution Date: 07/30/2019 Goal Status: Active Interventions: Assess peripheral edema status every visit. Provide education on venous insufficiency Treatment Activities: Non-invasive vascular studies : 06/30/2019 Venous Duplex Doppler : 05/10/2019 Notes: Wound/Skin Impairment Nursing Diagnoses: Knowledge deficit related to ulceration/compromised skin integrity Goals: Patient/caregiver will verbalize understanding of skin care regimen Date Initiated: 06/30/2019 Target Resolution Date: 07/30/2019 Goal Status: Active Interventions: Assess patient/caregiver ability to obtain necessary supplies Assess patient/caregiver ability to perform ulcer/skin care regimen upon admission and as needed Provide education on ulcer and skin care Treatment Activities: Skin care regimen initiated : 06/30/2019 Topical wound management initiated : 06/30/2019 Notes: Electronic Signature(s) Signed: 07/07/2019 9:54:21 AM By: Carlene Coria RN Entered By: Carlene Coria on 07/06/2019 08:34:55 -------------------------------------------------------------------------------- Pain Assessment Details Patient Name: Date of Service: RUTH, KOVICH NNE S. 07/06/2019 8:30 A M Medical Record Number: [id number] Patient Account Number: [id number] Date of Birth/Sex: Treating RN: Dec 20, 1945 (74 y.o. Elam Dutch Primary Care Tayden Nichelson: Marlena Clipper Other Clinician: Referring Shakeya Kerkman: Treating Sukhdeep Wieting/Extender: Pollyann Glen, MELISA Weeks in Treatment: 0 Active Problems Location of Pain Severity and Description of Pain Patient  Has Paino Yes Site Locations Pain Location: Generalized Pain With Dressing Change: No Duration of the  Pain. Constant / Intermittento Intermittent Rate the pain. Current Pain Level: 0 Worst Pain Level: 7 Least Pain Level: 0 Character of Pain Describe the Pain: Aching Pain Management and Medication Current Pain Management: Medication: Yes Is the Current Pain Management Adequate: Adequate How does your wound impact your activities of daily livingo Sleep: Yes Bathing: No Appetite: No Relationship With Others: No Bladder Continence: No Emotions: No Bowel Continence: No Work: No Toileting: No Drive: No Dressing: No Hobbies: No Notes c/o pain in thighs with ambulation Electronic Signature(s) Signed: 07/06/2019 5:45:30 PM By: Baruch Gouty RN, BSN Entered By: Baruch Gouty on 07/06/2019 08:34:11 -------------------------------------------------------------------------------- Patient/Caregiver Education Details Patient Name: Date of Service: Denise Bailey 6/15/2021andnbsp8:30 A M Medical Record Number: [id number] Patient Account Number: [id number] Date of Birth/Gender: Treating RN: 03-02-1945 (74 y.o. Orvan Falconer Primary Care Physician: Marlena Clipper Other Clinician: Referring Physician: Treating Physician/Extender: Fredderick Severance Weeks in Treatment: 0 Education Assessment Education Provided To: Patient Education Topics Provided Safety: Methods: Explain/Verbal Responses: State content correctly Venous: Methods: Explain/Verbal Responses: State content correctly Electronic Signature(s) Signed: 07/07/2019 9:54:21 AM By: Carlene Coria RN Entered By: Carlene Coria on 07/06/2019 08:35:19 -------------------------------------------------------------------------------- Wound Assessment Details Patient Name: Date of Service: Denise Bailey, Denise NNE S. 07/06/2019 8:30 A M Medical Record Number: [id number] Patient Account Number: [id number] Date of Birth/Sex: Treating RN: 31-Oct-1945 (74 y.o. Elam Dutch Primary Care Othello Dickenson: Marlena Clipper Other  Clinician: Referring Jaber Dunlow: Treating Clarrissa Shimkus/Extender: Pollyann Glen, MELISA Weeks in Treatment: 0 Wound Status Wound Number: 1 Primary Etiology: Lymphedema Wound Location: Left, Distal, Anterior Lower Leg Secondary Etiology: Venous Leg Ulcer Wounding Event: Gradually Appeared Wound Status: Open Date Acquired: 06/30/2018 Comorbid History: Peripheral Venous Disease, Received Chemotherapy Weeks Of Treatment: 0 Clustered Wound: No Wound Measurements Length: (cm) 1.2 Width: (cm) 1.3 Depth: (cm) 0.1 Area: (cm) 1.225 Volume: (cm) 0.123 % Reduction in Area: -143.5% % Reduction in Volume: -146% Epithelialization: Small (1-33%) Tunneling: No Undermining: No Wound Description Classification: Full Thickness Without Exposed Support Structures Wound Margin: Flat and Intact Exudate Amount: Medium Exudate Type: Serous Exudate Color: amber Foul Odor After Cleansing: No Slough/Fibrino Yes Wound Bed Granulation Amount: Large (67-100%) Exposed Structure Granulation Quality: Red Fascia Exposed: No Necrotic Amount: None Present (0%) Fat Layer (Subcutaneous Tissue) Exposed: Yes Tendon Exposed: No Muscle Exposed: No Joint Exposed: No Bone Exposed: No Treatment Notes Wound #1 (Left, Distal, Anterior Lower Leg) 1. Cleanse With Wound Cleanser Soap and water 2. Periwound Care Moisturizing lotion 3. Primary Dressing Applied Calcium Alginate 4. Secondary Dressing Dry Gauze 6. Support Layer Applied 3 layer compression wrap Notes netting. educated patient about [employer] and [employer]. Patient in agreement to call. Electronic Signature(s) Signed: 07/06/2019 5:45:30 PM By: Baruch Gouty RN, BSN Entered By: Baruch Gouty on 07/06/2019 08:53:31 -------------------------------------------------------------------------------- Wound Assessment Details Patient Name: Date of Service: Denise Bailey, Denise NNE S. 07/06/2019 8:30 A M Medical Record Number: [id number] Patient  Account Number: [id number] Date of Birth/Sex: Treating RN: 1945-04-01 (74 y.o. Elam Dutch Primary Care Natashia Roseman: Marlena Clipper Other Clinician: Referring Tracy Kinner: Treating Kimika Streater/Extender: Pollyann Glen, MELISA Weeks in Treatment: 0 Wound Status Wound Number: 2 Primary Etiology: Venous Leg Ulcer Wound Location: Right, Posterior Lower Leg Wound Status: Open Wounding Event: Blister Comorbid History: Peripheral Venous Disease, Received Chemotherapy Date Acquired: 07/06/2019 Weeks Of Treatment: 0 Clustered Wound: No Wound Measurements Length: (cm) 0.3 Width: (cm) 0.3 Depth: (cm)  0.1 Area: (cm) 0.071 Volume: (cm) 0.007 % Reduction in Area: % Reduction in Volume: Epithelialization: Medium (34-66%) Tunneling: No Undermining: No Wound Description Classification: Partial Thickness Wound Margin: Flat and Intact Exudate Amount: Medium Exudate Type: Serous Exudate Color: amber Foul Odor After Cleansing: No Slough/Fibrino No Wound Bed Granulation Amount: Large (67-100%) Exposed Structure Granulation Quality: Pink Fascia Exposed: No Necrotic Amount: None Present (0%) Fat Layer (Subcutaneous Tissue) Exposed: No Tendon Exposed: No Muscle Exposed: No Joint Exposed: No Bone Exposed: No Limited to Skin Breakdown Treatment Notes Wound #2 (Right, Posterior Lower Leg) 1. Cleanse With Wound Cleanser Soap and water 2. Periwound Care Moisturizing lotion 3. Primary Dressing Applied Calcium Alginate 4. Secondary Dressing Dry Gauze 6. Support Layer Applied 3 layer compression wrap Notes netting. educated patient about [employer] and [employer]. Patient in agreement to call. Electronic Signature(s) Signed: 07/06/2019 5:45:30 PM By: Baruch Gouty RN, BSN Entered By: Baruch Gouty on 07/06/2019 08:52:37 -------------------------------------------------------------------------------- Vitals Details Patient Name: Date of Service: Denise Ice NNE S.  07/06/2019 8:30 A M Medical Record Number: [id number] Patient Account Number: [id number] Date of Birth/Sex: Treating RN: February 05, 1945 (74 y.o. Elam Dutch Primary Care Krystalyn Kubota: Marlena Clipper Other Clinician: Referring Sherrica Niehaus: Treating Mancel Lardizabal/Extender: Pollyann Glen, MELISA Weeks in Treatment: 0 Vital Signs Time Taken: 08:34 Temperature (F): 98.3 Height (in): 61 Pulse (bpm): 82 Source: Stated Respiratory Rate (breaths/min): 18 Weight (lbs): 105 Blood Pressure (mmHg): 152/68 Source: Stated Reference Range: 80 - 120 mg / dl Body Mass Index (BMI): 19.8 Electronic Signature(s) Signed: 07/06/2019 5:45:30 PM By: Baruch Gouty RN, BSN Entered By: Baruch Gouty on 07/06/2019 08:34:48

## 2019-07-08 ENCOUNTER — Encounter (HOSPITAL_BASED_OUTPATIENT_CLINIC_OR_DEPARTMENT_OTHER): Payer: PPO | Admitting: Internal Medicine

## 2019-07-13 ENCOUNTER — Encounter (HOSPITAL_BASED_OUTPATIENT_CLINIC_OR_DEPARTMENT_OTHER): Payer: PPO | Admitting: Internal Medicine

## 2019-07-13 DIAGNOSIS — I890 Lymphedema, not elsewhere classified: Secondary | ICD-10-CM | POA: Diagnosis not present

## 2019-07-13 DIAGNOSIS — L97822 Non-pressure chronic ulcer of other part of left lower leg with fat layer exposed: Secondary | ICD-10-CM | POA: Diagnosis not present

## 2019-07-13 DIAGNOSIS — I872 Venous insufficiency (chronic) (peripheral): Secondary | ICD-10-CM | POA: Diagnosis not present

## 2019-07-13 DIAGNOSIS — I342 Nonrheumatic mitral (valve) stenosis: Secondary | ICD-10-CM | POA: Diagnosis not present

## 2019-07-14 NOTE — Progress Notes (Signed)
Denise Bailey ([id number]) Visit Report for 07/13/2019 HPI Details Patient Name: Date of Service: Denise Bailey, Denise Bailey 07/13/2019 10:00 A M Medical Record Number: [id number] Patient Account Number: [id number] Date of Birth/Sex: Treating RN: 1945-06-25 (74 y.o. Denise Bailey Primary Care Provider: Marlena Clipper Other Clinician: Referring Provider: Treating Provider/Extender: Pollyann Glen, MELISA Weeks in Treatment: 1 History of Present Illness HPI Description: 06/30/2019 upon evaluation today patient presents for initial evaluation here in our clinic concerning issues that she has been having with ulcers over the bilateral lower extremities which has been present for some time. She is seeing her primary care provider she also subsequently has [facility] He notes that she does have bilateral leg swelling which has developed over the past 4 to 5 months unfortunately she is not been able to easily get compression socks on. She has used diuretics and creams which have not helped according to his note. This was dated/26/21. Unfortunately he notes that there is really not much that can be done to improve her edema from a vascular standpoint. There is no procedural/laser ablation or other venous interventions that will be beneficial. He suggested referral to lymphedema clinic as well as compression stockings and that she may be getting a compression stocking but we are to try to help get them on. Subsequently the patient has also been seen by cardiology she does have severe aortic stenosis and needs a valve replacement apparently there is also "a hole in her heart although I am not really sure that she completely understood that as I cannot find any mention other than the severe aortic stenosis at this point. Either way she needs surgery but they really want her legs to be under control before proceeding with that surgery. Lastly the patient did have a  venous study which again showed reflux especially on the left but again there is no intervention by vascular that can be attempted at this point that would be beneficial for the patient. Patient does have a history of chronic venous insufficiency, lymphedema, and aortic valve stenosis which is stated to be severe. 6/15; patient admitted the clinic last week. She has chronic venous insufficiency with secondary lymphedema. She has a wound on the left anterior lower leg and today came in with a new area on the right posterior calf. We had put her in 3 layer compression bilaterally. The most pressing issue for the patient is that she is being considered for valvular heart surgery which cannot go on until the wounds are healed on her lower extremities. She tells me that she has support stockings at home but had a lot of trouble getting them on even with the help of her husband. According to our notes we ordered her juxta lite stockings but she did not hear from anybody in the last week. 6/22; the patient had a wound on her left anterior lower leg and then last week came in with an area on her right posterior lower leg. Both of these are closed today. She has chronic venous insufficiency with secondary lymphedema. She also has bilateral juxta lite stockings which I am hopeful will maintain skin integrity at least long enough for her to get considered for her valvular heart disease brackets aortic stenosis]. Electronic Signature(s) Signed: 07/13/2019 5:20:04 PM By: Linton Ham MD Entered By: Linton Ham on 07/13/2019 11:44:51 -------------------------------------------------------------------------------- Physical Exam Details Patient Name: Date of Service: Denise Ice NNE S. 07/13/2019 10:00 A M Medical  Record Number: [id number] Patient Account Number: [id number] Date of Birth/Sex: Treating RN: January 13, 1946 (74 y.o. Denise Bailey Primary Care Provider: Marlena Clipper Other Clinician: Referring  Provider: Treating Provider/Extender: Pollyann Glen, MELISA Weeks in Treatment: 1 Notes Wound exam; the patient's wounds on her left anterior leg is closed. On the right posterior leg also fully epithelialized. We have excellent edema control. Skin changes of chronic venous insufficiency however nothing is open. Peripheral pulses are palpable Electronic Signature(s) Signed: 07/13/2019 5:20:04 PM By: Linton Ham MD Entered By: Linton Ham on 07/13/2019 11:45:51 -------------------------------------------------------------------------------- Physician Orders Details Patient Name: Date of Service: Denise Ice NNE S. 07/13/2019 10:00 A M Medical Record Number: [id number] Patient Account Number: [id number] Date of Birth/Sex: Treating RN: 1945/12/17 (74 y.o. Denise Bailey Primary Care Provider: Marlena Clipper Other Clinician: Referring Provider: Treating Provider/Extender: Pollyann Glen, MELISA Weeks in Treatment: 1 Verbal / Phone Orders: No Diagnosis Coding ICD-10 Coding Code Description I87.2 Venous insufficiency (chronic) (peripheral) I89.0 Lymphedema, not elsewhere classified L97.822 Non-pressure chronic ulcer of other part of left lower leg with fat layer exposed I35.0 Nonrheumatic aortic (valve) stenosis Discharge From [facility] Discharge from [place] - patient to apply lotion every evening , patient to apply stockings in the am , off in the pm Electronic Signature(s) Signed: 07/13/2019 5:20:04 PM By: Linton Ham MD Signed: 07/14/2019 4:51:47 PM By: Carlene Coria RN Entered By: Carlene Coria on 07/13/2019 11:08:18 -------------------------------------------------------------------------------- Problem List Details Patient Name: Date of Service: Denise Ice NNE S. 07/13/2019 10:00 A M Medical Record Number: [id number] Patient Account Number: [id number] Date of Birth/Sex: Treating RN: 20-Feb-1945 (74 y.o. Denise Bailey Primary Care  Provider: Marlena Clipper Other Clinician: Referring Provider: Treating Provider/Extender: Pollyann Glen, MELISA Weeks in Treatment: 1 Active Problems ICD-10 Encounter Code Description Active Date MDM Diagnosis I87.2 Venous insufficiency (chronic) (peripheral) 06/30/2019 No Yes I89.0 Lymphedema, not elsewhere classified 06/30/2019 No Yes L97.822 Non-pressure chronic ulcer of other part of left lower leg with fat layer exposed6/09/2019 No Yes I35.0 Nonrheumatic aortic (valve) stenosis 06/30/2019 No Yes Inactive Problems Resolved Problems Electronic Signature(s) Signed: 07/13/2019 5:20:04 PM By: Linton Ham MD Entered By: Linton Ham on 07/13/2019 11:43:43 -------------------------------------------------------------------------------- Progress Note Details Patient Name: Date of Service: Denise Ice NNE S. 07/13/2019 10:00 A M Medical Record Number: [id number] Patient Account Number: [id number] Date of Birth/Sex: Treating RN: 1945/06/09 (74 y.o. Denise Bailey Primary Care Provider: Marlena Clipper Other Clinician: Referring Provider: Treating Provider/Extender: Pollyann Glen, MELISA Weeks in Treatment: 1 Subjective History of Present Illness (HPI) 06/30/2019 upon evaluation today patient presents for initial evaluation here in our clinic concerning issues that she has been having with ulcers over the bilateral lower extremities which has been present for some time. She is seeing her primary care provider she also subsequently has [facility] He notes that she does have bilateral leg swelling which has developed over the past 4 to 5 months unfortunately she is not been able to easily get compression socks on. She has used diuretics and creams which have not helped according to his note. This was dated/26/21. Unfortunately he notes that there is really not much that can be done to improve her edema from a vascular standpoint. There is  no procedural/laser ablation or other venous interventions that will be beneficial. He suggested referral to lymphedema clinic as well as compression stockings and that she may be getting a compression stocking but we are to try to help  get them on. Subsequently the patient has also been seen by cardiology she does have severe aortic stenosis and needs a valve replacement apparently there is also "a hole in her heart although I am not really sure that she completely understood that as I cannot find any mention other than the severe aortic stenosis at this point. Either way she needs surgery but they really want her legs to be under control before proceeding with that surgery. Lastly the patient did have a venous study which again showed reflux especially on the left but again there is no intervention by vascular that can be attempted at this point that would be beneficial for the patient. Patient does have a history of chronic venous insufficiency, lymphedema, and aortic valve stenosis which is stated to be severe. 6/15; patient admitted the clinic last week. She has chronic venous insufficiency with secondary lymphedema. She has a wound on the left anterior lower leg and today came in with a new area on the right posterior calf. We had put her in 3 layer compression bilaterally. The most pressing issue for the patient is that she is being considered for valvular heart surgery which cannot go on until the wounds are healed on her lower extremities. She tells me that she has support stockings at home but had a lot of trouble getting them on even with the help of her husband. According to our notes we ordered her juxta lite stockings but she did not hear from anybody in the last week. 6/22; the patient had a wound on her left anterior lower leg and then last week came in with an area on her right posterior lower leg. Both of these are closed today. She has chronic venous insufficiency with secondary  lymphedema. She also has bilateral juxta lite stockings which I am hopeful will maintain skin integrity at least long enough for her to get considered for her valvular heart disease brackets aortic stenosis]. Objective Constitutional Vitals Time Taken: 10:18 AM, Height: 61 in, Source: Stated, Weight: 105 lbs, Source: Stated, BMI: 19.8, Temperature: 98.2 F, Pulse: 71 bpm, Respiratory Rate: 18 breaths/min, Blood Pressure: 121/67 mmHg. Integumentary (Hair, Skin) Wound #1 status is Open. Original cause of wound was Gradually Appeared. The wound is located on the [facility] Lower Leg. The wound measures 0cm length x 0cm width x 0cm depth; 0cm^2 area and 0cm^3 volume. There is no tunneling or undermining noted. There is a none present amount of drainage noted. The wound margin is flat and intact. There is no granulation within the wound bed. There is no necrotic tissue within the wound bed. Wound #2 status is Open. Original cause of wound was Blister. The wound is located on the Right,Posterior Lower Leg. The wound measures 0cm length x 0cm width x 0cm depth; 0cm^2 area and 0cm^3 volume. The wound is limited to skin breakdown. There is no tunneling or undermining noted. There is a small amount of serous drainage noted. The wound margin is flat and intact. There is no granulation within the wound bed. There is no necrotic tissue within the wound bed. Assessment Active Problems ICD-10 Venous insufficiency (chronic) (peripheral) Lymphedema, not elsewhere classified Non-pressure chronic ulcer of other part of left lower leg with fat layer exposed Nonrheumatic aortic (valve) stenosis Plan Discharge From [facility] Services: Discharge from [place] - patient to apply lotion every evening , patient to apply stockings in the am , off in the pm 1. The patient can be discharged from the clinic 2.  She is wearing her juxta lite stockings and we showed her how to put these on. 3. Situation is  complicated because she has orthopnea needs to sleep almost sitting up with her legs dependent we talked about that as well. Electronic Signature(s) Signed: 07/13/2019 5:20:04 PM By: Linton Ham MD Entered By: Linton Ham on 07/13/2019 11:46:37 -------------------------------------------------------------------------------- SuperBill Details Patient Name: Date of Service: Denise Ice NNE S. 07/13/2019 Medical Record Number: [id number] Patient Account Number: [id number] Date of Birth/Sex: Treating RN: 08/24/1945 (74 y.o. Denise Bailey Primary Care Provider: Marlena Clipper Other Clinician: Referring Provider: Treating Provider/Extender: Pollyann Glen, MELISA Weeks in Treatment: 1 Diagnosis Coding ICD-10 Codes Code Description I87.2 Venous insufficiency (chronic) (peripheral) I89.0 Lymphedema, not elsewhere classified L97.822 Non-pressure chronic ulcer of other part of left lower leg with fat layer exposed I35.0 Nonrheumatic aortic (valve) stenosis Facility Procedures CPT4 Code: [id number] Description: 86381 - WOUND CARE VISIT-LEV 3 EST PT Modifier: Quantity: 1 Physician Procedures : CPT4 Code Description Modifier [id number] - WC PHYS LEVEL 2 - EST PT ICD-10 Diagnosis Description L97.822 Non-pressure chronic ulcer of other part of left lower leg with fat layer exposed I87.2 Venous insufficiency (chronic) (peripheral) I89.0  Lymphedema, not elsewhere classified Quantity: 1 Electronic Signature(s) Signed: 07/13/2019 5:20:04 PM By: Linton Ham MD Entered By: Linton Ham on 07/13/2019 11:46:58

## 2019-07-14 NOTE — Progress Notes (Addendum)
Denise, Bailey ([id number]) Visit Report for 07/13/2019 Arrival Information Details Patient Name: Date of Service: Denise Bailey, Denise Bailey 07/13/2019 10:00 A M Medical Record Number: [id number] Patient Account Number: [id number] Date of Birth/Sex: Treating RN: Mar 09, 1945 (74 y.o. Denise Bailey Primary Care Guerline Happ: Marlena Clipper Other Clinician: Referring Stewart Sasaki: Treating Juline Sanderford/Extender: Pollyann Glen, MELISA Weeks in Treatment: 1 Visit Information History Since Last Visit Added or deleted any medications: No Patient Arrived: Wheel Chair Any new allergies or adverse reactions: No Arrival Time: 10:11 Had a fall or experienced change in No Accompanied By: spouse activities of daily living that may affect Transfer Assistance: None risk of falls: Patient Identification Verified: Yes Signs or symptoms of abuse/neglect since last visito No Secondary Verification Process Completed: Yes Hospitalized since last visit: No Patient Requires Transmission-Based Precautions: No Implantable device outside of the clinic excluding No Patient Has Alerts: No cellular tissue based products placed in the center since last visit: Has Dressing in Place as Prescribed: Yes Has Compression in Place as Prescribed: Yes Pain Present Now: No Electronic Signature(s) Signed: 07/13/2019 5:28:52 PM By: Baruch Gouty RN, BSN Entered By: Baruch Gouty on 07/13/2019 10:18:12 -------------------------------------------------------------------------------- Clinic Level of Care Assessment Details Patient Name: Date of Service: Denise Bailey, Denise Bailey NNE S. 07/13/2019 10:00 A M Medical Record Number: [id number] Patient Account Number: [id number] Date of Birth/Sex: Treating RN: 1945-05-23 (74 y.o. Denise Bailey Primary Care Denise Bailey: Marlena Clipper Other Clinician: Referring Denise Bailey: Treating Denise Bailey/Extender: Pollyann Glen, MELISA Weeks in Treatment: 1 Clinic Level of Care Assessment Items TOOL  4 Quantity Score X- 1 0 Use when only an EandM is performed on FOLLOW-UP visit ASSESSMENTS - Nursing Assessment / Reassessment X- 1 10 Reassessment of Co-morbidities (includes updates in patient status) X- 1 5 Reassessment of Adherence to Treatment Plan ASSESSMENTS - Wound and Skin A ssessment / Reassessment []  - 0 Simple Wound Assessment / Reassessment - one wound X- 2 5 Complex Wound Assessment / Reassessment - multiple wounds []  - 0 Dermatologic / Skin Assessment (not related to wound area) ASSESSMENTS - Focused Assessment []  - 0 Circumferential Edema Measurements - multi extremities []  - 0 Nutritional Assessment / Counseling / Intervention []  - 0 Lower Extremity Assessment (monofilament, tuning fork, pulses) []  - 0 Peripheral Arterial Disease Assessment (using hand held doppler) ASSESSMENTS - Ostomy and/or Continence Assessment and Care []  - 0 Incontinence Assessment and Management []  - 0 Ostomy Care Assessment and Management (repouching, etc.) PROCESS - Coordination of Care X - Simple Patient / Family Education for ongoing care 1 15 []  - 0 Complex (extensive) Patient / Family Education for ongoing care X- 1 10 Staff obtains Programmer, systems, Records, T Results / Process Orders est []  - 0 Staff telephones HHA, Nursing Homes / Clarify orders / etc []  - 0 Routine Transfer to another Facility (non-emergent condition) []  - 0 Routine Hospital Admission (non-emergent condition) []  - 0 New Admissions / Biomedical engineer / Ordering NPWT Apligraf, etc. , []  - 0 Emergency Hospital Admission (emergent condition) X- 1 10 Simple Discharge Coordination []  - 0 Complex (extensive) Discharge Coordination PROCESS - Special Needs []  - 0 Pediatric / Minor Patient Management []  - 0 Isolation Patient Management []  - 0 Hearing / Language / Visual special needs []  - 0 Assessment of Community assistance (transportation, D/C planning, etc.) []  - 0 Additional assistance / Altered  mentation []  - 0 Support Surface(s) Assessment (bed, cushion, seat, etc.) INTERVENTIONS - Wound Cleansing / Measurement []  - 0 Simple Wound Cleansing - one  wound X- 2 5 Complex Wound Cleansing - multiple wounds X- 1 5 Wound Imaging (photographs - any number of wounds) []  - 0 Wound Tracing (instead of photographs) []  - 0 Simple Wound Measurement - one wound X- 2 5 Complex Wound Measurement - multiple wounds INTERVENTIONS - Wound Dressings []  - 0 Small Wound Dressing one or multiple wounds []  - 0 Medium Wound Dressing one or multiple wounds []  - 0 Large Wound Dressing one or multiple wounds []  - 0 Application of Medications - topical []  - 0 Application of Medications - injection INTERVENTIONS - Miscellaneous []  - 0 External ear exam []  - 0 Specimen Collection (cultures, biopsies, blood, body fluids, etc.) []  - 0 Specimen(s) / Culture(s) sent or taken to Lab for analysis []  - 0 Patient Transfer (multiple staff / Civil Service fast streamer / Similar devices) []  - 0 Simple Staple / Suture removal (25 or less) []  - 0 Complex Staple / Suture removal (26 or more) []  - 0 Hypo / Hyperglycemic Management (close monitor of Blood Glucose) []  - 0 Ankle / Brachial Index (ABI) - do not check if billed separately X- 1 5 Vital Signs Has the patient been seen at the hospital within the last three years: Yes Total Score: 90 Level Of Care: New/Established - Level 3 Electronic Signature(s) Signed: 07/14/2019 4:51:47 PM By: Carlene Coria RN Entered By: Carlene Coria on 07/13/2019 11:11:42 -------------------------------------------------------------------------------- Encounter Discharge Information Details Patient Name: Date of Service: Denise Bailey Denise Bailey NNE S. 07/13/2019 10:00 A M Medical Record Number: [id number] Patient Account Number: [id number] Date of Birth/Sex: Treating RN: May 22, 1945 (74 y.o. Denise Bailey Primary Care Dastan Krider: Marlena Clipper Other Clinician: Referring Lyrick Worland: Treating  Denise Bailey/Extender: Pollyann Glen, MELISA Weeks in Treatment: 1 Encounter Discharge Information Items Discharge Condition: Stable Ambulatory Status: Wheelchair Discharge Destination: Home Transportation: Private Auto Accompanied By: husband Schedule Follow-up Appointment: No Clinical Summary of Care: Notes juxtalites applied bilaterally, 20-30 compression Electronic Signature(s) Signed: 07/13/2019 5:14:59 PM By: Kela Millin Entered By: Kela Millin on 07/13/2019 11:18:50 -------------------------------------------------------------------------------- Lower Extremity Assessment Details Patient Name: Date of Service: Denise Bailey, Denise Bailey NNE S. 07/13/2019 10:00 A M Medical Record Number: [id number] Patient Account Number: [id number] Date of Birth/Sex: Treating RN: 1945/11/26 (74 y.o. Elam Dutch Primary Care Chad Tiznado: Marlena Clipper Other Clinician: Referring Elner Seifert: Treating Bowden Boody/Extender: Pollyann Glen, MELISA Weeks in Treatment: 1 Edema Assessment Assessed: [Left: No] [Right: No] Edema: [Left: Yes] [Right: Yes] Calf Left: Right: Point of Measurement: cm From Medial Instep 31.4 cm 32.6 cm Ankle Left: Right: Point of Measurement: cm From Medial Instep 20.1 cm 20.5 cm Vascular Assessment Pulses: Dorsalis Pedis Palpable: [Left:Yes] [Right:Yes] Electronic Signature(s) Signed: 07/13/2019 5:28:52 PM By: Baruch Gouty RN, BSN Entered By: Baruch Gouty on 07/13/2019 10:35:07 -------------------------------------------------------------------------------- Multi Wound Chart Details Patient Name: Date of Service: Denise Bailey Denise Bailey NNE S. 07/13/2019 10:00 A M Medical Record Number: [id number] Patient Account Number: [id number] Date of Birth/Sex: Treating RN: Oct 07, 1945 (74 y.o. Denise Bailey Primary Care Reise Gladney: Marlena Clipper Other Clinician: Referring Clessie Karras: Treating Hermilo Dutter/Extender: Pollyann Glen, MELISA Weeks in Treatment: 1 Vital  Signs Height(in): 61 Pulse(bpm): 71 Weight(lbs): 105 Blood Pressure(mmHg): 121/67 Body Mass Index(BMI): 20 Temperature(F): 98.2 Respiratory Rate(breaths/min): 18 Photos: [1:No Photos Left, Distal, Anterior Lower Leg] [2:No Photos Right, Posterior Lower Leg] [N/A:N/A N/A] Wound Location: [1:Gradually Appeared] [2:Blister] [N/A:N/A] Wounding Event: [1:Lymphedema] [2:Venous Leg Ulcer] [N/A:N/A] Primary Etiology: [1:Venous Leg Ulcer] [2:N/A] [N/A:N/A] Secondary Etiology: [1:Peripheral Venous Disease, Received] [2:Peripheral Venous Disease, Received] [N/A:N/A] Comorbid History: [1:Chemotherapy 06/30/2018] [2:Chemotherapy 07/06/2019] [N/A:N/A]  Date Acquired: [1:1] [2:1] [N/A:N/A] Weeks of Treatment: [1:Open] [2:Open] [N/A:N/A] Wound Status: [1:0x0x0] [2:0x0x0] [N/A:N/A] Measurements L x W x D (cm) [1:0] [2:0] [N/A:N/A] A (cm) : rea [1:0] [2:0] [N/A:N/A] Volume (cm) : [1:100.00%] [2:100.00%] [N/A:N/A] % Reduction in Area: [1:100.00%] [2:100.00%] [N/A:N/A] % Reduction in Volume: [1:Full Thickness Without Exposed] [2:Partial Thickness] [N/A:N/A] Classification: [1:Support Structures None Present] [2:Small] [N/A:N/A] Exudate Amount: [1:N/A] [2:Serous] [N/A:N/A] Exudate Type: [1:N/A] [2:amber] [N/A:N/A] Exudate Color: [1:Flat and Intact] [2:Flat and Intact] [N/A:N/A] Wound Margin: [1:None Present (0%)] [2:None Present (0%)] [N/A:N/A] Granulation Amount: [1:None Present (0%)] [2:None Present (0%)] [N/A:N/A] Necrotic Amount: [1:Fascia: No] [2:Fascia: No] [N/A:N/A] Exposed Structures: [1:Fat Layer (Subcutaneous Tissue) Exposed: No Tendon: No Muscle: No Joint: No Bone: No Large (67-100%)] [2:Fat Layer (Subcutaneous Tissue) Exposed: No Tendon: No Muscle: No Joint: No Bone: No Limited to Skin Breakdown Large (67-100%)] [N/A:N/A] Treatment Notes Electronic Signature(s) Signed: 07/13/2019 5:20:04 PM By: Linton Ham MD Signed: 07/14/2019 4:51:47 PM By: Carlene Coria RN Signed: 07/14/2019 4:51:47  PM By: Carlene Coria RN Entered By: Linton Ham on 07/13/2019 11:43:50 -------------------------------------------------------------------------------- [place] Details Patient Name: Date of Service: Denise Bailey Denise Bailey NNE S. 07/13/2019 10:00 A M Medical Record Number: [id number] Patient Account Number: [id number] Date of Birth/Sex: Treating RN: 1945/11/06 (74 y.o. Denise Bailey Primary Care Tokiko Diefenderfer: Marlena Clipper Other Clinician: Referring Josslyn Ciolek: Treating Demiah Gullickson/Extender: Pollyann Glen, MELISA Weeks in Treatment: 1 Active Inactive Electronic Signature(s) Signed: 07/14/2019 4:51:47 PM By: Carlene Coria RN Entered By: Carlene Coria on 07/13/2019 11:09:00 -------------------------------------------------------------------------------- Pain Assessment Details Patient Name: Date of Service: KENIYA, Denise Bailey NNE S. 07/13/2019 10:00 A M Medical Record Number: [id number] Patient Account Number: [id number] Date of Birth/Sex: Treating RN: February 01, 1945 (74 y.o. Elam Dutch Primary Care Shanetha Bradham: Marlena Clipper Other Clinician: Referring Fleda Pagel: Treating Arcangel Minion/Extender: Pollyann Glen, MELISA Weeks in Treatment: 1 Active Problems Location of Pain Severity and Description of Pain Patient Has Paino No Site Locations Rate the pain. Current Pain Level: 0 Pain Management and Medication Current Pain Management: Electronic Signature(s) Signed: 07/13/2019 5:28:52 PM By: Baruch Gouty RN, BSN Entered By: Baruch Gouty on 07/13/2019 10:18:56 -------------------------------------------------------------------------------- Patient/Caregiver Education Details Patient Name: Date of Service: Denise Bailey Mew 6/22/2021andnbsp10:00 A M Medical Record Number: [id number] Patient Account Number: [id number] Date of Birth/Gender: Treating RN: 08-12-1945 (74 y.o. Denise Bailey Primary Care Physician: Marlena Clipper Other Clinician: Referring  Physician: Treating Physician/Extender: Fredderick Severance Weeks in Treatment: 1 Education Assessment Education Provided To: Patient Education Topics Provided Venous: Methods: Explain/Verbal Responses: State content correctly [place]: o Methods: Explain/Verbal Responses: State content correctly Wound/Skin Impairment: Methods: Explain/Verbal Responses: State content correctly Electronic Signature(s) Signed: 07/14/2019 4:51:47 PM By: Carlene Coria RN Entered By: Carlene Coria on 07/13/2019 11:09:12 -------------------------------------------------------------------------------- Wound Assessment Details Patient Name: Date of Service: Denise Bailey, Denise Bailey NNE S. 07/13/2019 10:00 A M Medical Record Number: [id number] Patient Account Number: [id number] Date of Birth/Sex: Treating RN: Jul 29, 1945 (74 y.o. Elam Dutch Primary Care Rita Vialpando: Marlena Clipper Other Clinician: Referring Dazhane Villagomez: Treating Adilynn Bessey/Extender: Pollyann Glen, MELISA Weeks in Treatment: 1 Wound Status Wound Number: 1 Primary Etiology: Lymphedema Wound Location: Left, Distal, Anterior Lower Leg Secondary Etiology: Venous Leg Ulcer Wounding Event: Gradually Appeared Wound Status: Healed - Epithelialized Date Acquired: 06/30/2018 Comorbid History: Peripheral Venous Disease, Received Chemotherapy Weeks Of Treatment: 1 Clustered Wound: No Photos Wound Measurements Length: (cm) Width: (cm) Depth: (cm) Area: (cm) Volume: (cm) 0 % Reduction in Area: 100% 0 % Reduction in Volume: 100%  0 Epithelialization: Large (67-100%) 0 Tunneling: No 0 Undermining: No Wound Description Classification: Full Thickness Without Exposed Support Structures Wound Margin: Flat and Intact Exudate Amount: None Present Foul Odor After Cleansing: No Slough/Fibrino No Wound Bed Granulation Amount: None Present (0%) Exposed Structure Necrotic Amount: None Present (0%) Fascia Exposed:  No Fat Layer (Subcutaneous Tissue) Exposed: No Tendon Exposed: No Muscle Exposed: No Joint Exposed: No Bone Exposed: No Electronic Signature(s) Signed: 07/15/2019 5:20:52 PM By: Baruch Gouty RN, BSN Signed: 07/16/2019 5:12:49 PM By: Minerva Fester Previous Signature: 07/13/2019 5:28:52 PM Version By: Baruch Gouty RN, BSN Entered By: Minerva Fester on 07/15/2019 14:26:47 -------------------------------------------------------------------------------- Wound Assessment Details Patient Name: Date of Service: Denise Bailey Denise Bailey NNE S. 07/13/2019 10:00 A M Medical Record Number: [id number] Patient Account Number: [id number] Date of Birth/Sex: Treating RN: [birth year] (74 y.o. Elam Dutch Primary Care Dakotah Heiman: Marlena Clipper Other Clinician: Referring Helen Winterhalter: Treating Kami Kube/Extender: Pollyann Glen, MELISA Weeks in Treatment: 1 Wound Status Wound Number: 2 Primary Etiology: Venous Leg Ulcer Wound Location: Right, Posterior Lower Leg Wound Status: Healed - Epithelialized Wounding Event: Blister Comorbid History: Peripheral Venous Disease, Received Chemotherapy Date Acquired: 07/06/2019 Weeks Of Treatment: 1 Clustered Wound: No Photos Wound Measurements Length: (cm) Width: (cm) Depth: (cm) Area: (cm) Volume: (cm) 0 % Reduction in Area: 100% 0 % Reduction in Volume: 100% 0 Epithelialization: Large (67-100%) 0 Tunneling: No 0 Undermining: No Wound Description Classification: Partial Thickness Wound Margin: Flat and Intact Exudate Amount: Small Exudate Type: Serous Exudate Color: amber Foul Odor After Cleansing: No Slough/Fibrino No Wound Bed Granulation Amount: None Present (0%) Exposed Structure Necrotic Amount: None Present (0%) Fascia Exposed: No Fat Layer (Subcutaneous Tissue) Exposed: No Tendon Exposed: No Muscle Exposed: No Joint Exposed: No Bone Exposed: No Limited to Skin Breakdown Electronic Signature(s) Signed: 07/15/2019 5:20:52 PM By:  Baruch Gouty RN, BSN Signed: 07/16/2019 5:12:49 PM By: Minerva Fester Previous Signature: 07/13/2019 5:28:52 PM Version By: Baruch Gouty RN, BSN Entered By: Minerva Fester on 07/15/2019 14:27:18 -------------------------------------------------------------------------------- [place] Details Patient Name: Date of Service: Denise Bailey Denise Bailey NNE S. 07/13/2019 10:00 A M Medical Record Number: [id number] Patient Account Number: [id number] Date of Birth/Sex: Treating RN: 1945-03-12 (74 y.o. Elam Dutch Primary Care Denise Bailey Denise Bailey: Marlena Clipper Other Clinician: Referring Lakota Markgraf: Treating Koby Hartfield/Extender: Pollyann Glen, MELISA Weeks in Treatment: 1 Vital Signs Time Taken: 10:18 Temperature (F): 98.2 Height (in): 61 Pulse (bpm): 71 Source: Stated Respiratory Rate (breaths/min): 18 Weight (lbs): 105 Blood Pressure (mmHg): 121/67 Source: Stated Reference Range: 80 - 120 mg / dl Body Mass Index (BMI): 19.8 Electronic Signature(s) Signed: 07/13/2019 5:28:52 PM By: Baruch Gouty RN, BSN Entered By: Baruch Gouty on 07/13/2019 10:18:45

## 2019-07-19 ENCOUNTER — Telehealth: Payer: Self-pay | Admitting: Physician Assistant

## 2019-07-19 NOTE — Telephone Encounter (Signed)
I have spoken with the pt and will arrange PAT on 7/15 per pt request.

## 2019-07-19 NOTE — Telephone Encounter (Signed)
Follow Up:     Pt is returning your call. 

## 2019-07-19 NOTE — Telephone Encounter (Signed)
Forwarding to [facility]

## 2019-07-30 ENCOUNTER — Other Ambulatory Visit: Payer: Self-pay | Admitting: Physician Assistant

## 2019-07-30 ENCOUNTER — Encounter: Payer: Self-pay | Admitting: Physician Assistant

## 2019-07-30 DIAGNOSIS — I350 Nonrheumatic aortic (valve) stenosis: Secondary | ICD-10-CM

## 2019-08-02 ENCOUNTER — Ambulatory Visit: Payer: PPO | Admitting: Cardiology

## 2019-08-02 ENCOUNTER — Ambulatory Visit: Payer: PPO | Admitting: Cardiovascular Disease

## 2019-08-02 ENCOUNTER — Encounter: Payer: Self-pay | Admitting: Cardiovascular Disease

## 2019-08-02 ENCOUNTER — Other Ambulatory Visit: Payer: Self-pay

## 2019-08-02 VITALS — BP 130/82 | HR 78 | Ht 61.0 in | Wt 121.6 lb

## 2019-08-02 DIAGNOSIS — Q2111 Secundum atrial septal defect: Secondary | ICD-10-CM

## 2019-08-02 DIAGNOSIS — I350 Nonrheumatic aortic (valve) stenosis: Secondary | ICD-10-CM | POA: Diagnosis not present

## 2019-08-02 DIAGNOSIS — Q211 Atrial septal defect: Secondary | ICD-10-CM | POA: Diagnosis not present

## 2019-08-02 MED ORDER — ASPIRIN EC 81 MG PO TBEC: 81.0000 mg | DELAYED_RELEASE_TABLET | Freq: Every day | ORAL | 3 refills | Status: AC

## 2019-08-02 NOTE — Progress Notes (Signed)
Cardiology Office Note:    Date:  08/07/2019   ID:  Denise Bailey, DOB Sep 24, 1945, MRN [id number]  PCP:  Penelope Coop, FNP  [facility] Cardiologist:  Berniece Salines, DO  [place] Electrophysiologist:  None   Referring MD: Penelope Coop, FNP   Chief Complaint  Patient presents with  . Aortic Stenosis    History of Present Illness:    Denise Bailey is a 74 y.o. female with a hx of severe aortic stenosis, ostium secundum ASD, and severe chronic bilateral venous insufficiency in the lower extremities, presenting for follow-up evaluation.  I initially saw the patient in consultation in March 2021 for evaluation of severe aortic stenosis.  At the time she had multiple social issues and did not wish to proceed with further evaluation or treatment.  She was found to have significantly elevated BNP despite lack of associated symptoms.  Her primary complaint has been related to marked leg edema and stasis ulceration.  She was evaluated by Dr. Trula Slade with vascular surgery and she subsequently has undergone treatment at the wound care center.  Her leg swelling and wounds have improved.  As part of her preoperative work-up, she underwent right and left heart catheterization demonstrating significant flow to step up suggestive of left to right shunting.  She was ultimately found to have a large secundum ASD in addition to having severe symptomatic aortic stenosis.  She was ultimately evaluated by Dr. Roxy Manns in May 2021.  He did not feel that she was a good candidate for conventional surgery because of her poor functional status and comorbid medical conditions.  Our multidisciplinary heart team reviewed her case extensively and we have recommended transcatheter aortic valve replacement followed by percutaneous closure of her atrial septal defect.  She presents today for reevaluation after undergoing extensive therapy at the wound care center.  The patient is here with her friend today.   She has noted significant improvement with her legs and she has undergone more therapy at the wound care center.  She continues to have some swelling and discomfort but this is improved with the use of wraps and compression stockings.  She has no more open areas on her legs.  She did have an episode of chest discomfort this morning that radiated to the jaw and teeth.  This was associated with a feeling of her heart racing.  It lasted a few minutes and resolved on its own.  Did not require any specific medical treatment.  She denies orthopnea, PND, or shortness of breath with activity.  She remains limited to low-level activity because of her leg weakness and heaviness.  She ambulates with a walker in the home.  She is able to transfer and to remain independent.  She cooks for herself and is able to do light cleaning activities.  The patient has no other complaints today.  She is eager to move forward with TAVR.  Past Medical History:  Diagnosis Date  . Arthritis   . ASD (atrial septal defect)   . Bilateral leg edema 03/15/2019  . Breast cancer ([place])   . Complication of anesthesia    patient reportd having difficult time waking up from anesthesia  . Hypothyroidism   . Severe aortic stenosis 03/15/2019    Past Surgical History:  Procedure Laterality Date  . ABDOMINAL HYSTERECTOMY    . MASTECTOMY    . RIGHT/LEFT HEART CATH AND CORONARY ANGIOGRAPHY N/A 06/09/2019   Procedure: RIGHT/LEFT HEART CATH AND CORONARY ANGIOGRAPHY;  Surgeon:  [name], MD;  Location: [facility];  Service: Cardiovascular;  Laterality: N/A;  . TEE WITHOUT CARDIOVERSION N/A 06/22/2019   Procedure: TRANSESOPHAGEAL ECHOCARDIOGRAM (TEE);  Surgeon: Geralynn Rile, MD;  Location: [place];  Service: Cardiovascular;  Laterality: N/A;    Current Medications: Current Meds  Medication Sig  . acetaminophen (TYLENOL) 325 MG tablet Take 325-650 mg by mouth every 6 (six) hours as needed for mild pain or headache.    . fluticasone (FLONASE) 50 MCG/ACT nasal spray Place 1 spray into both nostrils daily as needed for allergies.  Marland Kitchen levothyroxine (SYNTHROID) 88 MCG tablet Take 88 mcg by mouth daily before breakfast.   . traMADol (ULTRAM) 50 MG tablet Take 50-100 mg by mouth 3 (three) times daily as needed.  . [DISCONTINUED] traMADol (ULTRAM) 50 MG tablet Take 50-100 mg by mouth 3 (three) times daily as needed for moderate pain.      Allergies:   Patient has no known allergies.   Social History   Socioeconomic History  . Marital status: Married    Spouse name: Not on file  . Number of children: Not on file  . Years of education: Not on file  . Highest education level: Not on file  Occupational History  . Not on file  Tobacco Use  . Smoking status: Never Smoker  . Smokeless tobacco: Never Used  Vaping Use  . Vaping Use: Never used  Substance and Sexual Activity  . Alcohol use: Never  . Drug use: Never  . Sexual activity: Not on file  Other Topics Concern  . Not on file  Social History Narrative  . Not on file   Social Determinants of Health   Financial Resource Strain:   . Difficulty of Paying Living Expenses:   Food Insecurity:   . Worried About Charity fundraiser in the Last Year:   . Arboriculturist in the Last Year:   Transportation Needs:   . Film/video editor (Medical):   Marland Kitchen Lack of Transportation (Non-Medical):   Physical Activity:   . Days of Exercise per Week:   . Minutes of Exercise per Session:   Stress:   . Feeling of Stress :   Social Connections:   . Frequency of Communication with Friends and Family:   . Frequency of Social Gatherings with Friends and Family:   . Attends Religious Services:   . Active Member of Clubs or Organizations:   . Attends Archivist Meetings:   Marland Kitchen Marital Status:      Family History: The patient's family history is not on file.  ROS:   Please see the history of present illness.    All other systems reviewed and are  negative.  EKGs/Labs/Other Studies Reviewed:    The following studies were reviewed today: Transesophageal echocardiogram 06/22/2019: IMPRESSIONS    1. A large secundum ASD is present in the anterior/superior aspect of the  IAS with L to R shunting. It measures 1.1 cm x 0.8 cm (0.64 cm2) by direct  3D MPR assessment. SVC rim: 1.6 cm; IVC rim 1.6 cm; aortic rim 0.6 cm;  posterior rim 1.1 cm; posterior  superior rim 1.4 cm; atrioventricular rim 2.1 cm. All rims are sufficient  for percutanous closure. Evidence of atrial level shunting detected by  color flow Doppler. There is a large secundum atrial septal defect with  predominantly left to right shunting  across the atrial septum.  2. Sievers type bicuspid aortic valve with fusion of  the RCC/LCC with  raphe. V max 4.4 m/s, mean gradient 42 mmHG, AVA by VTI 0.30 cm2. Direct  planimetry by 3D MPR 0.60 cm2. Mild AI with 3D ERO 0.08 cm2. The aortic  valve is bicuspid. Aortic valve  regurgitation is mild. Severe aortic valve stenosis. Aortic valve area, by  VTI measures 0.30 cm. Aortic valve mean gradient measures 42.0 mmHg.  Aortic valve Vmax measures 4.44 m/s.  3. Left ventricular ejection fraction, by estimation, is 60 to 65%. The  left ventricle has normal function. The left ventricle has no regional  wall motion abnormalities. There is the interventricular septum is  flattened in systole and diastole,  consistent with right ventricular pressure and volume overload.  4. Right ventricular systolic function is mildly reduced. The right  ventricular size is severely enlarged.  5. Left atrial size was mildly dilated. No left atrial/left atrial  appendage thrombus was detected. The LAA emptying velocity was 67 cm/s.  6. Right atrial size was severely dilated.  7. The mitral valve is grossly normal. Mild to moderate mitral valve  regurgitation.  8. Moderate tricuspid regurgitation. 2D PISA 0.66 cm. 2D VC 0.5 cm. 2D  ERO 0.29 cm2  with R vol 34 cc. 3D ERO 0.30 cm2 with R vol 33 cc. Tricuspid  valve regurgitation is moderate.  9. There is mild (Grade II) layered plaque involving the ascending aorta.   Cardiac catheterization 06/09/2019: Conclusion  1.  Angiographically normal coronary arteries with no evidence of coronary stenosis 2.  Severe calcific aortic stenosis with a calcified, restricted aortic valve on plain fluoroscopy and a mean pressure gradient of 38 mmHg, peak to peak gradient 43 mmHg, calculated valve area 1 cm 3.  Normal right heart pressures, normal LVEDP 4.  Oxygen saturation run suggestive of left to right shunt with QP/QS of 1.7, consider arteriovenous mixing below the level of the IVC.  Recommend: Continued multidisciplinary heart team evaluation for treatment of severe aortic stenosis, CTA of the chest, abdomen, and pelvis both for planning of aortic valve intervention and also evaluation of left to right shunting with possibility of shunting at the abdominal level.  Left Heart  Left Ventricle LV end diastolic pressure is normal.  Aortic Valve There is severe aortic valve stenosis. The aortic valve is calcified. There is restricted aortic valve motion. There is severe aortic stenosis with a mean transvalvular gradient of 38 mmHg and a calculated valve area of 1 cm  Coronary Diagrams  Diagnostic Dominance: Right      Link to Procedure Log  Procedure Log    Hemo Data   Most Recent Value  Fick Cardiac Output 7.42 L/min  Fick Cardiac Output Index 5.02 (L/min)/BSA  Aortic Mean Gradient 38.02 mmHg  Aortic Peak Gradient 43 mmHg  Aortic Valve Area 1.02  Aortic Value Area Index 0.69 cm2/BSA  RA A Wave 7 mmHg  RA V Wave 4 mmHg  RA Mean 5 mmHg  RV Systolic Pressure 38 mmHg  RV Diastolic Pressure 2 mmHg  RV EDP 7 mmHg  PA Systolic Pressure 38 mmHg  PA Diastolic Pressure 12 mmHg  PA Mean 24 mmHg  PW A Wave 13 mmHg  PW V Wave 9 mmHg  PW Mean 9 mmHg  AO Systolic Pressure 203 mmHg  AO  Diastolic Pressure 65 mmHg  AO Mean 89 mmHg  LV Systolic Pressure 559 mmHg  LV Diastolic Pressure 9 mmHg  LV EDP 14 mmHg  AOp Systolic Pressure 741 mmHg  AOp Diastolic Pressure 75 mmHg  AOp Mean Pressure 401 mmHg  LVp Systolic Pressure 027 mmHg  LVp Diastolic Pressure 8 mmHg  LVp EDP Pressure 14 mmHg  QP/QS 1.67  TPVR Index 4.79 HRUI  TSVR Index 17.74 HRUI  PVR SVR Ratio 0.18  TPVR/TSVR Ratio 0.27   CT Cardiac: FINDINGS: Image quality: Excellent.  Noise artifact is: Limited.  Valve Morphology: The aortic valve is bicuspid with fusion of the RCC/LCC (Sievers type 1, raphe type). There is moderate calcification of the RCC/LCC with severe thickening. There is no significant raphe calcification. There is severely restricted movement of the valve leaflets in systole.  Aortic Valve area: 0.71 cm2  Aortic Valve Calcium score: 1590  Aortic annular dimension:  Phase assessed: 30%  Annular area: 336 mm2  Annular perimeter: 66.2 mm  Max diameter: 23.3 mm  Min diameter: 18.7 mm  Annular and subannular calcification: No significant annular or subannular calcification.  Optimal coplanar projection: RAO 5 CRA 1  Coronary Artery Height above Annulus:  Left Main: 12.2 mm  Right Coronary: 15.5 mm  Sinus of Valsalva Measurements (Bicuspid Valve):  Commissure to Commissure: 23 mm.  Maximum Diameter: 29 mm.  Sinus of Valsalva Height:  Non-coronary: 23 mm  Right-coronary: 19 mm  Left-coronary: 18 mm  Sinotubular Junction: 23 mm.  Mild calcified plaque that is layered.  Ascending Thoracic Aorta: 27 mm.  No significant calcifications.  Coronary Arteries: Normal coronary origin. Right dominance. The study was performed without use of NTG and is insufficient for plaque evaluation. Please refer to recent cardiac cath for coronary assessment.  Cardiac Morphology:  Right Atrium: Right atrial size is severely dilated. There is contrast  reflux into the IVC consistent with elevated RA pressure.  Right Ventricle: The right ventricular cavity is severely dilated with mildy reduced function.  Tricuspid Valve: There is incomplete coaptation of the tricuspid valve consistent with at least moderate regurgitation. The valve leaflets appear normal.  Left Atrium: Left atrial size is dilated with no left atrial appendage filling defect. The IAS bows toward the RA consistent with increased LA pressure. There is a large secundum ASD present in the IAS that measures 10 mm x 12 mm with L to R shunting. The aortic rim measures 5.4 mm. The posterior rim measures 17 mm. The SVC rim measures 22 mm. The IVC rim measures 23 mm. The superior rim measures 22 mm and the atrioventricular rim measures 28 mm.  Left Ventricle: The ventricular cavity size is within normal limits. There are no stigmata of prior infarction. There is no abnormal filling defect. The septum is flattened in systole/diastole consistent with RV volume/pressure overload. LVEF=79%.  Pulmonary arteries: Dilated consistent with pulmonary hypertension.  Pulmonary veins: Normal pulmonary venous drainage.  Pericardium: Normal thickness with no significant effusion or calcium present.  Mitral Valve: The mitral valve is normal structure without significant calcification.  Extra-cardiac findings: See attached radiology report for non-cardiac structures.  IMPRESSION: 1. Bicuspid Aortic Valve with fusion of the RCC/LCC (Sievers type 1 with raphe).  2. Small annulus noted (336 mm2). Appropriate for 26 mm Medtronic Evolut TAVR.  3. No significant annular or subannular calcification.  4. Sufficient coronary to annulus distance.  5. Optimal Fluoroscopic Angle for Delivery: RAO 5 CRA 1  6. Large secundum type ASD (10 mm x 12 mm) with rim measurements for ASD closure described above. L to R shunting noted.  7. Severely dilated RA/RV and flattened septum  consistent with RV volume/pressure overload.  8. Dilated pulmonary artery consistent with pulmonary hypertension.  CT Angio Chest/Abd/Pelvis: AORTA:  Minimal Aortic Diameter-10 x 6 mm  Severity of Aortic Calcification-moderate  RIGHT PELVIS:  Right Common Iliac Artery -  Minimal Diameter-5.5 x 5.9 mm  Tortuosity - mild  Calcification - mild  Right External Iliac Artery -  Minimal Diameter-5.5 x 5.8 mm  Tortuosity - mild  Calcification-none  Right Common Femoral Artery -  Minimal Diameter-5.6 x 5.5 mm  Tortuosity - mild  Calcification-none  LEFT PELVIS:  Left Common Iliac Artery -  Minimal Diameter-6.6 x 6.3 mm  Tortuosity - mild  Calcification - mild  Left External Iliac Artery -  Minimal Diameter-5.5 x 5.7 mm  Tortuosity - mild  Calcification-none  Left Common Femoral Artery -  Minimal Diameter-5.7 x 5.5 mm  Tortuosity-mild  Calcification-none  Review of the MIP images confirms the above findings.  IMPRESSION: 1. Vascular findings and measurements pertinent to potential TAVR procedure, as detailed above. 2. Severe thickening calcification of the aortic valve, compatible with reported clinical history of severe aortic stenosis. 3. Probable atrial septal defect with enlarged right atrium, as discussed above. Clinical correlation for signs and symptoms of left right shunting is recommended. 4. Cirrhosis. 5. Aortic atherosclerosis. 6. Colonic diverticulosis without evidence of acute diverticulitis at this time. 7. Additional incidental findings, as above.  EKG:  EKG is ordered today.  The ekg from 05/27/2019 demonstrates NSR 80 bpm, RAE, otherwise within normal limits  Recent Labs: 03/15/2019: Magnesium 2.1 03/29/2019: NT-Pro BNP 737 08/05/2019: ALT 17; B Natriuretic Peptide 289.4; BUN 13; Creatinine, Ser 0.71; Hemoglobin 12.9; Platelets 127; Potassium 4.2; Sodium 138  Recent Lipid Panel No results found  for: CHOL, TRIG, HDL, CHOLHDL, VLDL, LDLCALC, LDLDIRECT  Physical Exam:    VS:  BP 130/82   Pulse 78   Ht 5\' 1"  (1.549 m)   Wt 121 lb 9.6 oz (55.2 kg)   SpO2 96%   BMI 22.98 kg/m     Wt Readings from Last 3 Encounters:  08/05/19 121 lb 12.8 oz (55.2 kg)  08/02/19 121 lb 9.6 oz (55.2 kg)  06/15/19 113 lb (51.3 kg)     GEN:  Elderly woman, in no acute distress HEENT: Normal NECK: No JVD; No carotid bruits LYMPHATICS: No lymphadenopathy CARDIAC: RRR, 2/6 systolic murmur at the right upper sternal border, harsh characteristic, late peaking with presence of a 2, no diastolic murmur RESPIRATORY:  Clear to auscultation without rales, wheezing or rhonchi  ABDOMEN: Soft, non-tender, non-distended MUSCULOSKELETAL: 2+ bilateral pretibial edema; No deformity  SKIN: Warm and dry, no residual ulceration noted. NEUROLOGIC:  Alert and oriented x 3 PSYCHIATRIC:  Normal affect   ASSESSMENT:    1. Severe aortic stenosis   2. Secundum ASD    PLAN:    In order of problems listed above:  1. The patient is further counseled regarding treatment options of her severe, stage D1 aortic stenosis.  The patient's chronic leg edema has improved with treatment at the wound clinic.  She currently has no open ulcerations.  All of her preoperative imaging studies have been reviewed and she is confirmed to have severe aortic stenosis with a small aortic valve annulus.  Cardiac catheterization demonstrates no significant coronary artery disease.  The patient's mean transaortic valve gradient by cardiac catheterization is 38 mmHg.  Echo demonstrates a Sievers type I bicuspid valve with fusion of the right and left cusps, peak transaortic velocity of 4.4 m/s, mean transvalvular gradient 42 mmHg.  CTA studies demonstrate adequate pelvic vasculature for transfemoral TAVR with a self-expanding  supra annular valve.  There is mild calcification of the proximal common iliac arteries bilaterally, but the lumen appears  adequate for passage of sheaths. Following the decision to proceed with transcatheter aortic valve replacement, a discussion has been held regarding what types of management strategies would be attempted intraoperatively in the event of life-threatening complications, including whether or not the patient would be considered a candidate for the use of cardiopulmonary bypass and/or conversion to open sternotomy for attempted surgical intervention.  The patient has been advised of a variety of complications that might develop including but not limited to risks of death, stroke, paravalvular leak, aortic dissection or other major vascular complications, aortic annulus rupture, device embolization, cardiac rupture or perforation, mitral regurgitation, acute myocardial infarction, arrhythmia, heart block or bradycardia requiring permanent pacemaker placement, congestive heart failure, respiratory failure, renal failure, pneumonia, infection, other late complications related to structural valve deterioration or migration, or other complications that might ultimately cause a temporary or permanent loss of functional independence or other long term morbidity.  The patient provides full informed consent for the procedure as described and all questions were answered. 2. The patient clearly has a hemodynamically significant secundum ASD based on findings from right heart catheterization, CT, and echo.  There is right heart enlargement with evidence of right heart failure.  She does not have severe pulmonary hypertension.  Anatomic assessment reveals adequate rims for transcatheter closure.  She will undergo transcatheter ASD closure after she recovers from TAVR.   Medication Adjustments/Labs and Tests Ordered: Current medicines are reviewed at length with the patient today.  Concerns regarding medicines are outlined above.  No orders of the defined types were placed in this encounter.  Meds ordered this encounter    Medications  . aspirin EC 81 MG tablet    Sig: Take 1 tablet (81 mg total) by mouth daily. Swallow whole.    Dispense:  90 tablet    Refill:  3    Patient Instructions  Medication Instructions:  1) START ASPIRIN 81 mg daily *If you need a refill on your cardiac medications before your next appointment, please call your pharmacy*    UPCOMING APPOINTMENTS: You are scheduled for pre TAVR testing on Thursday,7/15/21at  Hospital (Entrance C, Heart and Vascular parking garage - code [phone], take elevators to 1st floor). Please arrive at9:45 am for check in.   .You are scheduled for acarotid doppler(ultrasound of your neck)at 10:00 am. After completing this test please proceed to Admitting (2nd floor) to check-in for your pre-admission testing appointment  You are scheduled for Pre Admission Testing onThursday, July 15th, 2021 at 11:00AM.No one is allowed in with the patient for this visit due to Covid 19 restrictions. A support person can be conferenced in over the phone.   You are scheduled for drive thru Covid Swabbing (pre procedure lane)on Saturday, July 17th, 2021 between 8:00AM and 11:45AMat the [facility] ([facility]viously [facility]) [address] After this appointment you will need to go home and quarantine until you return for surgery. You can attend medical appointments.    Tuesday, July 20th, 2021 -TAVR:  Please arrive in Admitting at [facility](Main Entrance A) at 10:00AM for check-in.   Have nothing to eat or drink after midnight the night before surgery.  Continue taking all other medications without change through the day before surgery. On the morning of surgery do not take any medications.     Signed, [name], MD  08/07/2019 7:06 AM  [facility]

## 2019-08-02 NOTE — Patient Instructions (Signed)
Medication Instructions:  1) START ASPIRIN 81 mg daily *If you need a refill on your cardiac medications before your next appointment, please call your pharmacy*    UPCOMING APPOINTMENTS: You are scheduled for pre TAVR testing on Thursday,7/15/21at [facility] (Entrance C, Heart and Vascular parking garage - code [phone], take elevators to 1st floor). Please arrive at9:45 am for check in.   .You are scheduled for acarotid doppler(ultrasound of your neck)at 10:00 am. After completing this test please proceed to Admitting (2nd floor) to check-in for your pre-admission testing appointment  You are scheduled for Pre Admission Testing onThursday, July 15th, 2021 at 11:00AM.No one is allowed in with the patient for this visit due to Covid 19 restrictions. A support person can be conferenced in over the phone.   You are scheduled for drive thru Covid Swabbing (pre procedure lane)on Saturday, July 17th, 2021 between 8:00AM and 11:45AMat the [facility] ([facility]viously [facility]) [address]. After this appointment you will need to go home and quarantine until you return for surgery. You can attend medical appointments.    Tuesday, July 20th, 2021 -TAVR:  Please arrive in Admitting at [facility](Main Entrance A) at 10:00AM for check-in.   Have nothing to eat or drink after midnight the night before surgery.  Continue taking all other medications without change through the day before surgery. On the morning of surgery do not take any medications.

## 2019-08-02 NOTE — H&P (View-Only) (Signed)
Cardiology Office Note:    Date:  08/07/2019   ID:  Denise Bailey, DOB 09-14-1945, MRN [id number]  PCP:  Penelope Coop, FNP  [facility] Cardiologist:  Berniece Salines, DO  [place] Electrophysiologist:  None   Referring MD: Penelope Coop, FNP   Chief Complaint  Patient presents with  . Aortic Stenosis    History of Present Illness:    Denise Bailey is a 74 y.o. female with a hx of severe aortic stenosis, ostium secundum ASD, and severe chronic bilateral venous insufficiency in the lower extremities, presenting for follow-up evaluation.  I initially saw the patient in consultation in March 2021 for evaluation of severe aortic stenosis.  At the time she had multiple social issues and did not wish to proceed with further evaluation or treatment.  She was found to have significantly elevated BNP despite lack of associated symptoms.  Her primary complaint has been related to marked leg edema and stasis ulceration.  She was evaluated by Dr. Trula Slade with vascular surgery and she subsequently has undergone treatment at the wound care center.  Her leg swelling and wounds have improved.  As part of her preoperative work-up, she underwent right and left heart catheterization demonstrating significant flow to step up suggestive of left to right shunting.  She was ultimately found to have a large secundum ASD in addition to having severe symptomatic aortic stenosis.  She was ultimately evaluated by Dr. Roxy Manns in May 2021.  He did not feel that she was a good candidate for conventional surgery because of her poor functional status and comorbid medical conditions.  Our multidisciplinary heart team reviewed her case extensively and we have recommended transcatheter aortic valve replacement followed by percutaneous closure of her atrial septal defect.  She presents today for reevaluation after undergoing extensive therapy at the wound care center.  The patient is here with her friend today.   She has noted significant improvement with her legs and she has undergone more therapy at the wound care center.  She continues to have some swelling and discomfort but this is improved with the use of wraps and compression stockings.  She has no more open areas on her legs.  She did have an episode of chest discomfort this morning that radiated to the jaw and teeth.  This was associated with a feeling of her heart racing.  It lasted a few minutes and resolved on its own.  Did not require any specific medical treatment.  She denies orthopnea, PND, or shortness of breath with activity.  She remains limited to low-level activity because of her leg weakness and heaviness.  She ambulates with a walker in the home.  She is able to transfer and to remain independent.  She cooks for herself and is able to do light cleaning activities.  The patient has no other complaints today.  She is eager to move forward with TAVR.  Past Medical History:  Diagnosis Date  . Arthritis   . ASD (atrial septal defect)   . Bilateral leg edema 03/15/2019  . Breast cancer ([place])   . Complication of anesthesia    patient reportd having difficult time waking up from anesthesia  . Hypothyroidism   . Severe aortic stenosis 03/15/2019    Past Surgical History:  Procedure Laterality Date  . ABDOMINAL HYSTERECTOMY    . MASTECTOMY    . RIGHT/LEFT HEART CATH AND CORONARY ANGIOGRAPHY N/A 06/09/2019   Procedure: RIGHT/LEFT HEART CATH AND CORONARY ANGIOGRAPHY;  Surgeon:  [name], MD;  Location: [facility];  Service: Cardiovascular;  Laterality: N/A;  . TEE WITHOUT CARDIOVERSION N/A 06/22/2019   Procedure: TRANSESOPHAGEAL ECHOCARDIOGRAM (TEE);  Surgeon: Geralynn Rile, MD;  Location: [facility];  Service: Cardiovascular;  Laterality: N/A;    Current Medications: Current Meds  Medication Sig  . acetaminophen (TYLENOL) 325 MG tablet Take 325-650 mg by mouth every 6 (six) hours as needed for mild pain or headache.   . fluticasone (FLONASE) 50 MCG/ACT nasal spray Place 1 spray into both nostrils daily as needed for allergies.  Marland Kitchen levothyroxine (SYNTHROID) 88 MCG tablet Take 88 mcg by mouth daily before breakfast.   . traMADol (ULTRAM) 50 MG tablet Take 50-100 mg by mouth 3 (three) times daily as needed.  . [DISCONTINUED] traMADol (ULTRAM) 50 MG tablet Take 50-100 mg by mouth 3 (three) times daily as needed for moderate pain.      Allergies:   Patient has no known allergies.   Social History   Socioeconomic History  . Marital status: Married    Spouse name: Not on file  . Number of children: Not on file  . Years of education: Not on file  . Highest education level: Not on file  Occupational History  . Not on file  Tobacco Use  . Smoking status: Never Smoker  . Smokeless tobacco: Never Used  Vaping Use  . Vaping Use: Never used  Substance and Sexual Activity  . Alcohol use: Never  . Drug use: Never  . Sexual activity: Not on file  Other Topics Concern  . Not on file  Social History Narrative  . Not on file   Social Determinants of Health   Financial Resource Strain:   . Difficulty of Paying Living Expenses:   Food Insecurity:   . Worried About Charity fundraiser in the Last Year:   . Arboriculturist in the Last Year:   Transportation Needs:   . Film/video editor (Medical):   Marland Kitchen Lack of Transportation (Non-Medical):   Physical Activity:   . Days of Exercise per Week:   . Minutes of Exercise per Session:   Stress:   . Feeling of Stress :   Social Connections:   . Frequency of Communication with Friends and Family:   . Frequency of Social Gatherings with Friends and Family:   . Attends Religious Services:   . Active Member of Clubs or Organizations:   . Attends Archivist Meetings:   Marland Kitchen Marital Status:      Family History: The patient's family history is not on file.  ROS:   Please see the history of present illness.    All other systems reviewed and are  negative.  EKGs/Labs/Other Studies Reviewed:    The following studies were reviewed today: Transesophageal echocardiogram 06/22/2019: IMPRESSIONS    1. A large secundum ASD is present in the anterior/superior aspect of the  IAS with L to R shunting. It measures 1.1 cm x 0.8 cm (0.64 cm2) by direct  3D MPR assessment. SVC rim: 1.6 cm; IVC rim 1.6 cm; aortic rim 0.6 cm;  posterior rim 1.1 cm; posterior  superior rim 1.4 cm; atrioventricular rim 2.1 cm. All rims are sufficient  for percutanous closure. Evidence of atrial level shunting detected by  color flow Doppler. There is a large secundum atrial septal defect with  predominantly left to right shunting  across the atrial septum.  2. Sievers type bicuspid aortic valve with fusion of the  RCC/LCC with  raphe. V max 4.4 m/s, mean gradient 42 mmHG, AVA by VTI 0.30 cm2. Direct  planimetry by 3D MPR 0.60 cm2. Mild AI with 3D ERO 0.08 cm2. The aortic  valve is bicuspid. Aortic valve  regurgitation is mild. Severe aortic valve stenosis. Aortic valve area, by  VTI measures 0.30 cm. Aortic valve mean gradient measures 42.0 mmHg.  Aortic valve Vmax measures 4.44 m/s.  3. Left ventricular ejection fraction, by estimation, is 60 to 65%. The  left ventricle has normal function. The left ventricle has no regional  wall motion abnormalities. There is the interventricular septum is  flattened in systole and diastole,  consistent with right ventricular pressure and volume overload.  4. Right ventricular systolic function is mildly reduced. The right  ventricular size is severely enlarged.  5. Left atrial size was mildly dilated. No left atrial/left atrial  appendage thrombus was detected. The LAA emptying velocity was 67 cm/s.  6. Right atrial size was severely dilated.  7. The mitral valve is grossly normal. Mild to moderate mitral valve  regurgitation.  8. Moderate tricuspid regurgitation. 2D PISA 0.66 cm. 2D VC 0.5 cm. 2D  ERO 0.29 cm2  with R vol 34 cc. 3D ERO 0.30 cm2 with R vol 33 cc. Tricuspid  valve regurgitation is moderate.  9. There is mild (Grade II) layered plaque involving the ascending aorta.   Cardiac catheterization 06/09/2019: Conclusion  1.  Angiographically normal coronary arteries with no evidence of coronary stenosis 2.  Severe calcific aortic stenosis with a calcified, restricted aortic valve on plain fluoroscopy and a mean pressure gradient of 38 mmHg, peak to peak gradient 43 mmHg, calculated valve area 1 cm 3.  Normal right heart pressures, normal LVEDP 4.  Oxygen saturation run suggestive of left to right shunt with QP/QS of 1.7, consider arteriovenous mixing below the level of the IVC.  Recommend: Continued multidisciplinary heart team evaluation for treatment of severe aortic stenosis, CTA of the chest, abdomen, and pelvis both for planning of aortic valve intervention and also evaluation of left to right shunting with possibility of shunting at the abdominal level.  Left Heart  Left Ventricle LV end diastolic pressure is normal.  Aortic Valve There is severe aortic valve stenosis. The aortic valve is calcified. There is restricted aortic valve motion. There is severe aortic stenosis with a mean transvalvular gradient of 38 mmHg and a calculated valve area of 1 cm  Coronary Diagrams  Diagnostic Dominance: Right      Link to Procedure Log  Procedure Log    Hemo Data   Most Recent Value  Fick Cardiac Output 7.42 L/min  Fick Cardiac Output Index 5.02 (L/min)/BSA  Aortic Mean Gradient 38.02 mmHg  Aortic Peak Gradient 43 mmHg  Aortic Valve Area 1.02  Aortic Value Area Index 0.69 cm2/BSA  RA A Wave 7 mmHg  RA V Wave 4 mmHg  RA Mean 5 mmHg  RV Systolic Pressure 38 mmHg  RV Diastolic Pressure 2 mmHg  RV EDP 7 mmHg  PA Systolic Pressure 38 mmHg  PA Diastolic Pressure 12 mmHg  PA Mean 24 mmHg  PW A Wave 13 mmHg  PW V Wave 9 mmHg  PW Mean 9 mmHg  AO Systolic Pressure 621 mmHg  AO  Diastolic Pressure 65 mmHg  AO Mean 89 mmHg  LV Systolic Pressure 308 mmHg  LV Diastolic Pressure 9 mmHg  LV EDP 14 mmHg  AOp Systolic Pressure 657 mmHg  AOp Diastolic Pressure 75 mmHg  AOp Mean Pressure 322 mmHg  LVp Systolic Pressure 025 mmHg  LVp Diastolic Pressure 8 mmHg  LVp EDP Pressure 14 mmHg  QP/QS 1.67  TPVR Index 4.79 HRUI  TSVR Index 17.74 HRUI  PVR SVR Ratio 0.18  TPVR/TSVR Ratio 0.27   CT Cardiac: FINDINGS: Image quality: Excellent.  Noise artifact is: Limited.  Valve Morphology: The aortic valve is bicuspid with fusion of the RCC/LCC (Sievers type 1, raphe type). There is moderate calcification of the RCC/LCC with severe thickening. There is no significant raphe calcification. There is severely restricted movement of the valve leaflets in systole.  Aortic Valve area: 0.71 cm2  Aortic Valve Calcium score: 1590  Aortic annular dimension:  Phase assessed: 30%  Annular area: 336 mm2  Annular perimeter: 66.2 mm  Max diameter: 23.3 mm  Min diameter: 18.7 mm  Annular and subannular calcification: No significant annular or subannular calcification.  Optimal coplanar projection: RAO 5 CRA 1  Coronary Artery Height above Annulus:  Left Main: 12.2 mm  Right Coronary: 15.5 mm  Sinus of Valsalva Measurements (Bicuspid Valve):  Commissure to Commissure: 23 mm.  Maximum Diameter: 29 mm.  Sinus of Valsalva Height:  Non-coronary: 23 mm  Right-coronary: 19 mm  Left-coronary: 18 mm  Sinotubular Junction: 23 mm.  Mild calcified plaque that is layered.  Ascending Thoracic Aorta: 27 mm.  No significant calcifications.  Coronary Arteries: Normal coronary origin. Right dominance. The study was performed without use of NTG and is insufficient for plaque evaluation. Please refer to recent cardiac cath for coronary assessment.  Cardiac Morphology:  Right Atrium: Right atrial size is severely dilated. There is contrast  reflux into the IVC consistent with elevated RA pressure.  Right Ventricle: The right ventricular cavity is severely dilated with mildy reduced function.  Tricuspid Valve: There is incomplete coaptation of the tricuspid valve consistent with at least moderate regurgitation. The valve leaflets appear normal.  Left Atrium: Left atrial size is dilated with no left atrial appendage filling defect. The IAS bows toward the RA consistent with increased LA pressure. There is a large secundum ASD present in the IAS that measures 10 mm x 12 mm with L to R shunting. The aortic rim measures 5.4 mm. The posterior rim measures 17 mm. The SVC rim measures 22 mm. The IVC rim measures 23 mm. The superior rim measures 22 mm and the atrioventricular rim measures 28 mm.  Left Ventricle: The ventricular cavity size is within normal limits. There are no stigmata of prior infarction. There is no abnormal filling defect. The septum is flattened in systole/diastole consistent with RV volume/pressure overload. LVEF=79%.  Pulmonary arteries: Dilated consistent with pulmonary hypertension.  Pulmonary veins: Normal pulmonary venous drainage.  Pericardium: Normal thickness with no significant effusion or calcium present.  Mitral Valve: The mitral valve is normal structure without significant calcification.  Extra-cardiac findings: See attached radiology report for non-cardiac structures.  IMPRESSION: 1. Bicuspid Aortic Valve with fusion of the RCC/LCC (Sievers type 1 with raphe).  2. Small annulus noted (336 mm2). Appropriate for 26 mm Medtronic Evolut TAVR.  3. No significant annular or subannular calcification.  4. Sufficient coronary to annulus distance.  5. Optimal Fluoroscopic Angle for Delivery: RAO 5 CRA 1  6. Large secundum type ASD (10 mm x 12 mm) with rim measurements for ASD closure described above. L to R shunting noted.  7. Severely dilated RA/RV and flattened septum  consistent with RV volume/pressure overload.  8. Dilated pulmonary artery consistent with pulmonary hypertension.  CT Angio Chest/Abd/Pelvis: AORTA:  Minimal Aortic Diameter-10 x 6 mm  Severity of Aortic Calcification-moderate  RIGHT PELVIS:  Right Common Iliac Artery -  Minimal Diameter-5.5 x 5.9 mm  Tortuosity - mild  Calcification - mild  Right External Iliac Artery -  Minimal Diameter-5.5 x 5.8 mm  Tortuosity - mild  Calcification-none  Right Common Femoral Artery -  Minimal Diameter-5.6 x 5.5 mm  Tortuosity - mild  Calcification-none  LEFT PELVIS:  Left Common Iliac Artery -  Minimal Diameter-6.6 x 6.3 mm  Tortuosity - mild  Calcification - mild  Left External Iliac Artery -  Minimal Diameter-5.5 x 5.7 mm  Tortuosity - mild  Calcification-none  Left Common Femoral Artery -  Minimal Diameter-5.7 x 5.5 mm  Tortuosity-mild  Calcification-none  Review of the MIP images confirms the above findings.  IMPRESSION: 1. Vascular findings and measurements pertinent to potential TAVR procedure, as detailed above. 2. Severe thickening calcification of the aortic valve, compatible with reported clinical history of severe aortic stenosis. 3. Probable atrial septal defect with enlarged right atrium, as discussed above. Clinical correlation for signs and symptoms of left right shunting is recommended. 4. Cirrhosis. 5. Aortic atherosclerosis. 6. Colonic diverticulosis without evidence of acute diverticulitis at this time. 7. Additional incidental findings, as above.  EKG:  EKG is ordered today.  The ekg from 05/27/2019 demonstrates NSR 80 bpm, RAE, otherwise within normal limits  Recent Labs: 03/15/2019: Magnesium 2.1 03/29/2019: NT-Pro BNP 737 08/05/2019: ALT 17; B Natriuretic Peptide 289.4; BUN 13; Creatinine, Ser 0.71; Hemoglobin 12.9; Platelets 127; Potassium 4.2; Sodium 138  Recent Lipid Panel No results found  for: CHOL, TRIG, HDL, CHOLHDL, VLDL, LDLCALC, LDLDIRECT  Physical Exam:    VS:  BP 130/82   Pulse 78   Ht 5\' 1"  (1.549 m)   Wt 121 lb 9.6 oz (55.2 kg)   SpO2 96%   BMI 22.98 kg/m     Wt Readings from Last 3 Encounters:  08/05/19 121 lb 12.8 oz (55.2 kg)  08/02/19 121 lb 9.6 oz (55.2 kg)  06/15/19 113 lb (51.3 kg)     GEN:  Elderly woman, in no acute distress HEENT: Normal NECK: No JVD; No carotid bruits LYMPHATICS: No lymphadenopathy CARDIAC: RRR, 2/6 systolic murmur at the right upper sternal border, harsh characteristic, late peaking with presence of a 2, no diastolic murmur RESPIRATORY:  Clear to auscultation without rales, wheezing or rhonchi  ABDOMEN: Soft, non-tender, non-distended MUSCULOSKELETAL: 2+ bilateral pretibial edema; No deformity  SKIN: Warm and dry, no residual ulceration noted. NEUROLOGIC:  Alert and oriented x 3 PSYCHIATRIC:  Normal affect   ASSESSMENT:    1. Severe aortic stenosis   2. Secundum ASD    PLAN:    In order of problems listed above:  1. The patient is further counseled regarding treatment options of her severe, stage D1 aortic stenosis.  The patient's chronic leg edema has improved with treatment at the wound clinic.  She currently has no open ulcerations.  All of her preoperative imaging studies have been reviewed and she is confirmed to have severe aortic stenosis with a small aortic valve annulus.  Cardiac catheterization demonstrates no significant coronary artery disease.  The patient's mean transaortic valve gradient by cardiac catheterization is 38 mmHg.  Echo demonstrates a Sievers type I bicuspid valve with fusion of the right and left cusps, peak transaortic velocity of 4.4 m/s, mean transvalvular gradient 42 mmHg.  CTA studies demonstrate adequate pelvic vasculature for transfemoral TAVR with a self-expanding  supra annular valve.  There is mild calcification of the proximal common iliac arteries bilaterally, but the lumen appears  adequate for passage of sheaths. Following the decision to proceed with transcatheter aortic valve replacement, a discussion has been held regarding what types of management strategies would be attempted intraoperatively in the event of life-threatening complications, including whether or not the patient would be considered a candidate for the use of cardiopulmonary bypass and/or conversion to open sternotomy for attempted surgical intervention.  The patient has been advised of a variety of complications that might develop including but not limited to risks of death, stroke, paravalvular leak, aortic dissection or other major vascular complications, aortic annulus rupture, device embolization, cardiac rupture or perforation, mitral regurgitation, acute myocardial infarction, arrhythmia, heart block or bradycardia requiring permanent pacemaker placement, congestive heart failure, respiratory failure, renal failure, pneumonia, infection, other late complications related to structural valve deterioration or migration, or other complications that might ultimately cause a temporary or permanent loss of functional independence or other long term morbidity.  The patient provides full informed consent for the procedure as described and all questions were answered. 2. The patient clearly has a hemodynamically significant secundum ASD based on findings from right heart catheterization, CT, and echo.  There is right heart enlargement with evidence of right heart failure.  She does not have severe pulmonary hypertension.  Anatomic assessment reveals adequate rims for transcatheter closure.  She will undergo transcatheter ASD closure after she recovers from TAVR.   Medication Adjustments/Labs and Tests Ordered: Current medicines are reviewed at length with the patient today.  Concerns regarding medicines are outlined above.  No orders of the defined types were placed in this encounter.  Meds ordered this encounter   Medications  . aspirin EC 81 MG tablet    Sig: Take 1 tablet (81 mg total) by mouth daily. Swallow whole.    Dispense:  90 tablet    Refill:  3    Patient Instructions  Medication Instructions:  1) START ASPIRIN 81 mg daily *If you need a refill on your cardiac medications before your next appointment, please call your pharmacy*    UPCOMING APPOINTMENTS: You are scheduled for pre TAVR testing on Thursday,7/15/21at [facility] (Entrance C, Heart and Vascular parking garage - code [phone], take elevators to 1st floor). Please arrive at9:45 am for check in.   .You are scheduled for acarotid doppler(ultrasound of your neck)at 10:00 am. After completing this test please proceed to Admitting (2nd floor) to check-in for your pre-admission testing appointment  You are scheduled for Pre Admission Testing onThursday, July 15th, 2021 at 11:00AM.No one is allowed in with the patient for this visit due to Covid 19 restrictions. A support person can be conferenced in over the phone.   You are scheduled for drive thru Covid Swabbing (pre procedure lane)on Saturday, July 17th, 2021 between 8:00AM and 11:45AMat the [facility] ([facility]viously [facility]) [address] After this appointment you will need to go home and quarantine until you return for surgery. You can attend medical appointments.    Tuesday, July 20th, 2021 -TAVR:  Please arrive in Admitting at [facility](Main Entrance A) at 10:00AM for check-in.   Have nothing to eat or drink after midnight the night before surgery.  Continue taking all other medications without change through the day before surgery. On the morning of surgery do not take any medications.     Signed, [name], MD  08/07/2019 7:06 AM  [facility]

## 2019-08-05 ENCOUNTER — Encounter (HOSPITAL_COMMUNITY)
Admission: RE | Admit: 2019-08-05 | Discharge: 2019-08-05 | Disposition: A | Discharge status: Home / Self Care | Payer: PPO | Source: Ambulatory Visit | Attending: Cardiovascular Disease | Admitting: Cardiovascular Disease

## 2019-08-05 ENCOUNTER — Ambulatory Visit (HOSPITAL_COMMUNITY)
Admission: RE | Admit: 2019-08-05 | Discharge: 2019-08-05 | Disposition: A | Discharge status: Home / Self Care | Payer: PPO | Source: Ambulatory Visit | Attending: Physician Assistant | Admitting: Physician Assistant

## 2019-08-05 ENCOUNTER — Other Ambulatory Visit: Payer: Self-pay

## 2019-08-05 ENCOUNTER — Encounter (HOSPITAL_COMMUNITY): Payer: Self-pay

## 2019-08-05 DIAGNOSIS — I700 Atherosclerosis of aorta: Secondary | ICD-10-CM | POA: Insufficient documentation

## 2019-08-05 DIAGNOSIS — I350 Nonrheumatic aortic (valve) stenosis: Secondary | ICD-10-CM

## 2019-08-05 DIAGNOSIS — Z01818 Encounter for other preprocedural examination: Secondary | ICD-10-CM | POA: Insufficient documentation

## 2019-08-05 HISTORY — DX: Atrial septal defect: Q21.1

## 2019-08-05 HISTORY — DX: Unspecified osteoarthritis, unspecified site: M19.90

## 2019-08-05 HISTORY — DX: Atrial septal defect, unspecified: Q21.10

## 2019-08-05 LAB — CBC
HCT: 41.5 % (ref 36.0–46.0)
Hemoglobin: 12.9 g/dL (ref 12.0–15.0)
MCH: 29.6 pg (ref 26.0–34.0)
MCHC: 31.1 g/dL (ref 30.0–36.0)
MCV: 95.2 fL (ref 80.0–100.0)
Platelets: 127 10*3/uL — ABNORMAL LOW (ref 150–400)
RBC: 4.36 MIL/uL (ref 3.87–5.11)
RDW: 15.4 % (ref 11.5–15.5)
WBC: 5.4 10*3/uL (ref 4.0–10.5)
nRBC: 0 % (ref 0.0–0.2)

## 2019-08-05 LAB — PROTIME-INR
INR: 1.2 (ref 0.8–1.2)
Prothrombin Time: 15.1 seconds (ref 11.4–15.2)

## 2019-08-05 LAB — COMPREHENSIVE METABOLIC PANEL
ALT: 17 U/L (ref 0–44)
AST: 24 U/L (ref 15–41)
Albumin: 3.7 g/dL (ref 3.5–5.0)
Alkaline Phosphatase: 114 U/L (ref 38–126)
Anion gap: 9 (ref 5–15)
BUN: 13 mg/dL (ref 8–23)
CO2: 21 mmol/L — ABNORMAL LOW (ref 22–32)
Calcium: 9.2 mg/dL (ref 8.9–10.3)
Chloride: 108 mmol/L (ref 98–111)
Creatinine, Ser: 0.71 mg/dL (ref 0.44–1.00)
GFR calc Af Amer: >60 mL/min (ref >60)
GFR calc non Af Amer: >60 mL/min (ref >60)
Glucose, Bld: 91 mg/dL (ref 70–99)
Potassium: 4.2 mmol/L (ref 3.5–5.1)
Sodium: 138 mmol/L (ref 135–145)
Total Bilirubin: 0.8 mg/dL (ref 0.3–1.2)
Total Protein: 6.6 g/dL (ref 6.5–8.1)

## 2019-08-05 LAB — BLOOD GAS, ARTERIAL
Acid-base deficit: 0.1 mmol/L (ref 0.0–2.0)
Bicarbonate: 24 mmol/L (ref 20.0–28.0)
Drawn by: 421801
FIO2: 21
O2 Saturation: 98.5 %
Patient temperature: 37
pCO2 arterial: 38.5 mmHg (ref 32.0–48.0)
pH, Arterial: 7.411 (ref 7.350–7.450)
pO2, Arterial: 119 mmHg — ABNORMAL HIGH (ref 83.0–108.0)

## 2019-08-05 LAB — URINALYSIS, ROUTINE W REFLEX MICROSCOPIC
Bilirubin Urine: NEGATIVE
Glucose, UA: NEGATIVE mg/dL
Hgb urine dipstick: NEGATIVE
Ketones, ur: NEGATIVE mg/dL
Leukocytes,Ua: NEGATIVE
Nitrite: NEGATIVE
Protein, ur: NEGATIVE mg/dL
Specific Gravity, Urine: 1.019 (ref 1.005–1.030)
pH: 7 (ref 5.0–8.0)

## 2019-08-05 LAB — TYPE AND SCREEN
ABO/RH(D): O POS
Antibody Screen: NEGATIVE

## 2019-08-05 LAB — APTT: aPTT: 34 seconds (ref 24–36)

## 2019-08-05 LAB — SURGICAL PCR SCREEN
MRSA, PCR: NEGATIVE
Staphylococcus aureus: NEGATIVE

## 2019-08-05 LAB — HEMOGLOBIN A1C
Hgb A1c MFr Bld: 5.4 % (ref 4.8–5.6)
Mean Plasma Glucose: 108.28 mg/dL

## 2019-08-05 LAB — BRAIN NATRIURETIC PEPTIDE: B Natriuretic Peptide: 289.4 pg/mL — ABNORMAL HIGH (ref 0.0–100.0)

## 2019-08-05 NOTE — Progress Notes (Signed)
PCP - Collier Salina @[facility]  Medical Cardiologist - Godfrey Pick Tobb @ [facility]  PPM/ICD - na   Chest x-ray - 08/05/19 EKG - 08/05/19 Stress Test - na ECHO - 06/22/19 Cardiac Cath - 06/09/19  Sleep Study - na  Fasting Blood Sugar - na   Blood Thinner Instructions: na Aspirin Instructions: continue not to take DOS  ERAS Protcol - na  COVID TEST- 08/07/19   Anesthesia review:   Patient denies shortness of breath, fever, cough and chest pain at PAT appointment   All instructions explained to the patient, with a verbal understanding of the material. Patient agrees to go over the instructions while at home for a better understanding. Patient also instructed to self quarantine after being tested for COVID-19. The opportunity to ask questions was provided.

## 2019-08-05 NOTE — Progress Notes (Signed)
Carotid duplex has been completed.   Preliminary results in CV Proc.   Abram Sander 08/05/2019 9:54 AM

## 2019-08-05 NOTE — Pre-Procedure Instructions (Signed)
Aliahna Statzer  08/05/2019      [place] [place] [address] Phone: [phone] Fax: [phone]    Your procedure is scheduled on July 20  Report to [facility] Entrance A at 10:00 A.M.  Call this number if you have problems the morning of surgery:  [phone]   Remember:  Do not eat or drink after midnight.      Take these medicines the morning of surgery with A SIP OF WATER : none                 7 days prior to surgery STOP taking any Aspirin (unless otherwise instructed by your surgeon), Aleve, Naproxen, Ibuprofen, Motrin, Advil, Goody's, BC's, all herbal medications, fish oil, and all vitamins.       Follow your surgeon's instructions on when to stop Aspirin.  If no instructions were given by your surgeon then you will need to call the office to get those instructions.       Do not wear jewelry, make-up or nail polish.  Do not wear lotions, powders, or perfumes, or deodorant.  Do not shave 48 hours prior to surgery.  Men may shave face and neck.  Do not bring valuables to the hospital.  [facility] is not responsible for any belongings or valuables.  Contacts, dentures or bridgework may not be worn into surgery.  Leave your suitcase in the car.  After surgery it may be brought to your room.  For patients admitted to the hospital, discharge time will be determined by your treatment team.  Patients discharged the day of surgery will not be allowed to drive home.    Special instructions:   [facility]- Preparing For Surgery  Before surgery, you can play an important role. Because skin is not sterile, your skin needs to be as free of germs as possible. You can reduce the number of germs on your skin by washing with CHG (chlorahexidine gluconate) Soap before surgery.  CHG is an antiseptic cleaner which kills germs and bonds with the skin to continue killing germs even after washing.    Oral  Hygiene is also important to reduce your risk of infection.  Remember - BRUSH YOUR TEETH THE MORNING OF SURGERY WITH YOUR REGULAR TOOTHPASTE  Please do not use if you have an allergy to CHG or antibacterial soaps. If your skin becomes reddened/irritated stop using the CHG.  Do not shave (including legs and underarms) for at least 48 hours prior to first CHG shower. It is OK to shave your face.  Please follow these instructions carefully.   1. Shower the NIGHT BEFORE SURGERY and the MORNING OF SURGERY with CHG.   2. If you chose to wash your hair, wash your hair first as usual with your normal shampoo.  3. After you shampoo, rinse your hair and body thoroughly to remove the shampoo.  4. Use CHG as you would any other liquid soap. You can apply CHG directly to the skin and wash gently with a scrungie or a clean washcloth.   5. Apply the CHG Soap to your body ONLY FROM THE NECK DOWN.  Do not use on open wounds or open sores. Avoid contact with your eyes, ears, mouth and genitals (private parts). Wash Face and genitals (private parts)  with your normal soap.  6. Wash thoroughly, paying special attention to the area where your surgery will be  performed.  7. Thoroughly rinse your body with warm water from the neck down.  8. DO NOT shower/wash with your normal soap after using and rinsing off the CHG Soap.  9. Pat yourself dry with a CLEAN TOWEL.  10. Wear CLEAN PAJAMAS to bed the night before surgery, wear comfortable clothes the morning of surgery  11. Place CLEAN SHEETS on your bed the night of your first shower and DO NOT SLEEP WITH PETS.    Day of Surgery:  Do not apply any deodorants/lotions.  Please wear clean clothes to the hospital/surgery center.   Remember to brush your teeth WITH YOUR REGULAR TOOTHPASTE.    Please read over the following fact sheets that you were given. Coughing and Deep Breathing and Surgical Site Infection Prevention

## 2019-08-07 ENCOUNTER — Other Ambulatory Visit (HOSPITAL_COMMUNITY)
Admission: RE | Admit: 2019-08-07 | Discharge: 2019-08-07 | Disposition: A | Discharge status: Home / Self Care | Payer: PPO | Source: Ambulatory Visit | Attending: Cardiovascular Disease | Admitting: Cardiovascular Disease

## 2019-08-07 DIAGNOSIS — Q211 Atrial septal defect: Secondary | ICD-10-CM | POA: Diagnosis not present

## 2019-08-07 DIAGNOSIS — I350 Nonrheumatic aortic (valve) stenosis: Secondary | ICD-10-CM | POA: Diagnosis present

## 2019-08-07 DIAGNOSIS — Z90710 Acquired absence of both cervix and uterus: Secondary | ICD-10-CM | POA: Diagnosis not present

## 2019-08-07 DIAGNOSIS — Z9012 Acquired absence of left breast and nipple: Secondary | ICD-10-CM | POA: Diagnosis not present

## 2019-08-07 DIAGNOSIS — Z01812 Encounter for preprocedural laboratory examination: Secondary | ICD-10-CM | POA: Insufficient documentation

## 2019-08-07 DIAGNOSIS — Z79890 Hormone replacement therapy (postmenopausal): Secondary | ICD-10-CM | POA: Diagnosis not present

## 2019-08-07 DIAGNOSIS — E039 Hypothyroidism, unspecified: Secondary | ICD-10-CM | POA: Diagnosis present

## 2019-08-07 DIAGNOSIS — Z006 Encounter for examination for normal comparison and control in clinical research program: Secondary | ICD-10-CM | POA: Diagnosis not present

## 2019-08-07 DIAGNOSIS — M1990 Unspecified osteoarthritis, unspecified site: Secondary | ICD-10-CM | POA: Diagnosis not present

## 2019-08-07 DIAGNOSIS — Z853 Personal history of malignant neoplasm of breast: Secondary | ICD-10-CM | POA: Diagnosis not present

## 2019-08-07 DIAGNOSIS — Z20822 Contact with and (suspected) exposure to covid-19: Secondary | ICD-10-CM | POA: Diagnosis present

## 2019-08-07 DIAGNOSIS — I2729 Other secondary pulmonary hypertension: Secondary | ICD-10-CM | POA: Diagnosis present

## 2019-08-07 DIAGNOSIS — E876 Hypokalemia: Secondary | ICD-10-CM | POA: Diagnosis present

## 2019-08-07 DIAGNOSIS — I5033 Acute on chronic diastolic (congestive) heart failure: Secondary | ICD-10-CM | POA: Diagnosis present

## 2019-08-07 DIAGNOSIS — I071 Rheumatic tricuspid insufficiency: Secondary | ICD-10-CM | POA: Diagnosis present

## 2019-08-07 DIAGNOSIS — Z952 Presence of prosthetic heart valve: Secondary | ICD-10-CM | POA: Diagnosis not present

## 2019-08-07 DIAGNOSIS — Z79899 Other long term (current) drug therapy: Secondary | ICD-10-CM | POA: Diagnosis not present

## 2019-08-07 LAB — SARS CORONAVIRUS 2 (TAT 6-24 HRS): SARS Coronavirus 2: NEGATIVE

## 2019-08-09 MED ORDER — NOREPINEPHRINE 4 MG/250ML-% IV SOLN
0.0000 ug/min | INTRAVENOUS | Status: AC
  Administered 2019-08-10: 2 ug/min via INTRAVENOUS
  Filled 2019-08-09: qty 250

## 2019-08-09 MED ORDER — SODIUM CHLORIDE 0.9 % IV SOLN
INTRAVENOUS | Status: DC
  Filled 2019-08-09 (×2): qty 30

## 2019-08-09 MED ORDER — VANCOMYCIN HCL 1250 MG/250ML IV SOLN
1250.0000 mg | INTRAVENOUS | Status: AC
  Administered 2019-08-10: 1250 mg via INTRAVENOUS
  Filled 2019-08-09 (×2): qty 250

## 2019-08-09 MED ORDER — DEXMEDETOMIDINE HCL IN NACL 400 MCG/100ML IV SOLN
0.1000 ug/kg/h | INTRAVENOUS | Status: AC
  Administered 2019-08-10: 27.6 ug via INTRAVENOUS
  Administered 2019-08-10: 1 ug/kg/h via INTRAVENOUS
  Administered 2019-08-10: .7 ug/kg/h via INTRAVENOUS
  Filled 2019-08-09: qty 100

## 2019-08-09 MED ORDER — SODIUM CHLORIDE 0.9 % IV SOLN
1.5000 g | INTRAVENOUS | Status: AC
  Administered 2019-08-10: 1.5 g via INTRAVENOUS
  Filled 2019-08-09 (×2): qty 1.5

## 2019-08-09 MED ORDER — POTASSIUM CHLORIDE 2 MEQ/ML IV SOLN
80.0000 meq | INTRAVENOUS | Status: DC
  Filled 2019-08-09: qty 40

## 2019-08-09 MED ORDER — MAGNESIUM SULFATE 50 % IJ SOLN
40.0000 meq | INTRAMUSCULAR | Status: DC
  Filled 2019-08-09: qty 9.85

## 2019-08-10 ENCOUNTER — Inpatient Hospital Stay (HOSPITAL_COMMUNITY): Payer: PPO | Admitting: Anesthesiology

## 2019-08-10 ENCOUNTER — Encounter (HOSPITAL_COMMUNITY): Payer: Self-pay | Admitting: Cardiovascular Disease

## 2019-08-10 ENCOUNTER — Other Ambulatory Visit: Payer: Self-pay

## 2019-08-10 ENCOUNTER — Inpatient Hospital Stay (HOSPITAL_COMMUNITY): Payer: PPO

## 2019-08-10 ENCOUNTER — Encounter (HOSPITAL_COMMUNITY)
Admission: RE | Disposition: A | Discharge status: Home / Self Care | Payer: PPO | Source: Home / Self Care | Attending: Cardiovascular Disease

## 2019-08-10 ENCOUNTER — Inpatient Hospital Stay (HOSPITAL_COMMUNITY)
Admission: RE | Admit: 2019-08-10 | Discharge: 2019-08-11 | DRG: 266 | Disposition: A | Discharge status: Home / Self Care | Payer: PPO | Attending: Cardiovascular Disease | Admitting: Cardiovascular Disease

## 2019-08-10 ENCOUNTER — Other Ambulatory Visit: Payer: Self-pay | Admitting: Physician Assistant

## 2019-08-10 DIAGNOSIS — E039 Hypothyroidism, unspecified: Secondary | ICD-10-CM | POA: Diagnosis present

## 2019-08-10 DIAGNOSIS — Z79890 Hormone replacement therapy (postmenopausal): Secondary | ICD-10-CM

## 2019-08-10 DIAGNOSIS — I071 Rheumatic tricuspid insufficiency: Secondary | ICD-10-CM | POA: Diagnosis present

## 2019-08-10 DIAGNOSIS — Z952 Presence of prosthetic heart valve: Secondary | ICD-10-CM

## 2019-08-10 DIAGNOSIS — I5033 Acute on chronic diastolic (congestive) heart failure: Secondary | ICD-10-CM

## 2019-08-10 DIAGNOSIS — E876 Hypokalemia: Secondary | ICD-10-CM | POA: Diagnosis present

## 2019-08-10 DIAGNOSIS — I2729 Other secondary pulmonary hypertension: Secondary | ICD-10-CM | POA: Diagnosis present

## 2019-08-10 DIAGNOSIS — I350 Nonrheumatic aortic (valve) stenosis: Secondary | ICD-10-CM

## 2019-08-10 DIAGNOSIS — Z90710 Acquired absence of both cervix and uterus: Secondary | ICD-10-CM | POA: Diagnosis not present

## 2019-08-10 DIAGNOSIS — Z79899 Other long term (current) drug therapy: Secondary | ICD-10-CM

## 2019-08-10 DIAGNOSIS — R600 Localized edema: Secondary | ICD-10-CM | POA: Diagnosis present

## 2019-08-10 DIAGNOSIS — Z853 Personal history of malignant neoplasm of breast: Secondary | ICD-10-CM | POA: Diagnosis not present

## 2019-08-10 DIAGNOSIS — Q211 Atrial septal defect, unspecified: Secondary | ICD-10-CM

## 2019-08-10 DIAGNOSIS — Z20822 Contact with and (suspected) exposure to covid-19: Secondary | ICD-10-CM | POA: Diagnosis present

## 2019-08-10 DIAGNOSIS — Z9012 Acquired absence of left breast and nipple: Secondary | ICD-10-CM

## 2019-08-10 DIAGNOSIS — I872 Venous insufficiency (chronic) (peripheral): Secondary | ICD-10-CM | POA: Diagnosis present

## 2019-08-10 DIAGNOSIS — Z006 Encounter for examination for normal comparison and control in clinical research program: Secondary | ICD-10-CM | POA: Diagnosis not present

## 2019-08-10 HISTORY — DX: Personal history of malignant neoplasm of breast: Z85.3

## 2019-08-10 HISTORY — DX: Presence of prosthetic heart valve: Z95.2

## 2019-08-10 LAB — POCT I-STAT, CHEM 8
BUN: 11 mg/dL (ref 8–23)
BUN: 12 mg/dL (ref 8–23)
BUN: 12 mg/dL (ref 8–23)
Calcium, Ion: 1.25 mmol/L (ref 1.15–1.40)
Calcium, Ion: 1.25 mmol/L (ref 1.15–1.40)
Calcium, Ion: 1.27 mmol/L (ref 1.15–1.40)
Chloride: 107 mmol/L (ref 98–111)
Chloride: 105 mmol/L (ref 98–111)
Chloride: 105 mmol/L (ref 98–111)
Creatinine, Ser: 0.5 mg/dL (ref 0.44–1.00)
Creatinine, Ser: 0.4 mg/dL — ABNORMAL LOW (ref 0.44–1.00)
Creatinine, Ser: 0.5 mg/dL (ref 0.44–1.00)
Glucose, Bld: 97 mg/dL (ref 70–99)
Glucose, Bld: 103 mg/dL — ABNORMAL HIGH (ref 70–99)
Glucose, Bld: 105 mg/dL — ABNORMAL HIGH (ref 70–99)
HCT: 35 % — ABNORMAL LOW (ref 36.0–46.0)
HCT: 32 % — ABNORMAL LOW (ref 36.0–46.0)
HCT: 35 % — ABNORMAL LOW (ref 36.0–46.0)
Hemoglobin: 11.9 g/dL — ABNORMAL LOW (ref 12.0–15.0)
Hemoglobin: 10.9 g/dL — ABNORMAL LOW (ref 12.0–15.0)
Hemoglobin: 11.9 g/dL — ABNORMAL LOW (ref 12.0–15.0)
Potassium: 3.3 mmol/L — ABNORMAL LOW (ref 3.5–5.1)
Potassium: 3.4 mmol/L — ABNORMAL LOW (ref 3.5–5.1)
Potassium: 3.6 mmol/L (ref 3.5–5.1)
Sodium: 145 mmol/L (ref 135–145)
Sodium: 144 mmol/L (ref 135–145)
Sodium: 146 mmol/L — ABNORMAL HIGH (ref 135–145)
TCO2: 24 mmol/L (ref 22–32)
TCO2: 24 mmol/L (ref 22–32)
TCO2: 26 mmol/L (ref 22–32)

## 2019-08-10 LAB — POCT ACTIVATED CLOTTING TIME
Activated Clotting Time: 136 seconds
Activated Clotting Time: 136 seconds
Activated Clotting Time: 345 seconds

## 2019-08-10 LAB — ECHOCARDIOGRAM LIMITED
AR max vel: 2.55 cm2
AV Area VTI: 2.99 cm2
AV Area mean vel: 2.28 cm2
AV Mean grad: 4 mmHg
AV Peak grad: 7.6 mmHg
Ao pk vel: 1.38 m/s

## 2019-08-10 LAB — ABO/RH: ABO/RH(D): O POS

## 2019-08-10 SURGERY — IMPLANTATION, AORTIC VALVE, TRANSCATHETER, FEMORAL APPROACH
Anesthesia: Monitor Anesthesia Care | Site: Chest

## 2019-08-10 MED ORDER — CLOPIDOGREL BISULFATE 75 MG PO TABS
75.0000 mg | ORAL_TABLET | Freq: Every day | ORAL | Status: DC
  Administered 2019-08-11: 75 mg via ORAL
  Filled 2019-08-10: qty 1

## 2019-08-10 MED ORDER — ASPIRIN EC 81 MG PO TBEC
81.0000 mg | DELAYED_RELEASE_TABLET | Freq: Every day | ORAL | Status: DC
  Administered 2019-08-10 – 2019-08-11 (×2): 81 mg via ORAL
  Filled 2019-08-10 (×2): qty 1

## 2019-08-10 MED ORDER — HEPARIN (PORCINE) IN NACL 1000-0.9 UT/500ML-% IV SOLN
INTRAVENOUS | Status: DC | PRN
  Administered 2019-08-10 (×2): 500 mL

## 2019-08-10 MED ORDER — LIDOCAINE HCL (PF) 1 % IJ SOLN
INTRAMUSCULAR | Status: DC | PRN
  Administered 2019-08-10: 20 mL

## 2019-08-10 MED ORDER — SODIUM CHLORIDE 0.9 % IV SOLN: 250.0000 mL | INTRAVENOUS | Status: DC | PRN

## 2019-08-10 MED ORDER — HEPARIN SODIUM (PORCINE) 1000 UNIT/ML IJ SOLN
INTRAMUSCULAR | Status: DC | PRN
  Administered 2019-08-10: 8000 [IU] via INTRAVENOUS

## 2019-08-10 MED ORDER — SODIUM CHLORIDE 0.9% FLUSH: 3.0000 mL | INTRAVENOUS | Status: DC | PRN

## 2019-08-10 MED ORDER — SODIUM CHLORIDE 0.9 % IV SOLN
1.5000 g | Freq: Two times a day (BID) | INTRAVENOUS | Status: DC
  Administered 2019-08-10 – 2019-08-11 (×2): 1.5 g via INTRAVENOUS
  Filled 2019-08-10 (×3): qty 1.5

## 2019-08-10 MED ORDER — ALBUMIN HUMAN 5 % IV SOLN: INTRAVENOUS | Status: DC | PRN

## 2019-08-10 MED ORDER — ACETAMINOPHEN 325 MG PO TABS: 650.0000 mg | ORAL_TABLET | Freq: Four times a day (QID) | ORAL | Status: DC | PRN

## 2019-08-10 MED ORDER — CHLORHEXIDINE GLUCONATE 4 % EX LIQD: 30.0000 mL | CUTANEOUS | Status: DC

## 2019-08-10 MED ORDER — CHLORHEXIDINE GLUCONATE 0.12 % MT SOLN
15.0000 mL | Freq: Once | OROMUCOSAL | Status: AC
  Filled 2019-08-10: qty 15

## 2019-08-10 MED ORDER — CHLORHEXIDINE GLUCONATE 4 % EX LIQD
60.0000 mL | Freq: Once | CUTANEOUS | Status: DC
  Filled 2019-08-10: qty 60

## 2019-08-10 MED ORDER — OXYCODONE HCL 5 MG PO TABS: 5.0000 mg | ORAL_TABLET | ORAL | Status: DC | PRN

## 2019-08-10 MED ORDER — SODIUM CHLORIDE 0.9 % IV SOLN: INTRAVENOUS | Status: DC

## 2019-08-10 MED ORDER — ONDANSETRON HCL 4 MG/2ML IJ SOLN: 4.0000 mg | Freq: Four times a day (QID) | INTRAMUSCULAR | Status: DC | PRN

## 2019-08-10 MED ORDER — HEPARIN (PORCINE) IN NACL 1000-0.9 UT/500ML-% IV SOLN
INTRAVENOUS | Status: AC
  Filled 2019-08-10: qty 1500

## 2019-08-10 MED ORDER — IOHEXOL 350 MG/ML SOLN
INTRAVENOUS | Status: AC
  Filled 2019-08-10: qty 1

## 2019-08-10 MED ORDER — LACTATED RINGERS IV SOLN: INTRAVENOUS | Status: DC

## 2019-08-10 MED ORDER — SODIUM CHLORIDE 0.9% FLUSH: 3.0000 mL | Freq: Two times a day (BID) | INTRAVENOUS | Status: DC

## 2019-08-10 MED ORDER — PROPOFOL 10 MG/ML IV BOLUS
INTRAVENOUS | Status: DC | PRN
  Administered 2019-08-10: 25 mg via INTRAVENOUS
  Administered 2019-08-10: 5 mg via INTRAVENOUS
  Administered 2019-08-10 (×2): 15 mg via INTRAVENOUS
  Administered 2019-08-10: 10 mg via INTRAVENOUS

## 2019-08-10 MED ORDER — VANCOMYCIN HCL IN DEXTROSE 1-5 GM/200ML-% IV SOLN
1000.0000 mg | Freq: Once | INTRAVENOUS | Status: AC
  Administered 2019-08-10: 1000 mg via INTRAVENOUS
  Filled 2019-08-10: qty 200

## 2019-08-10 MED ORDER — PROPOFOL 500 MG/50ML IV EMUL
INTRAVENOUS | Status: DC | PRN
  Administered 2019-08-10: 10 ug/kg/min via INTRAVENOUS

## 2019-08-10 MED ORDER — TRAMADOL HCL 50 MG PO TABS
50.0000 mg | ORAL_TABLET | ORAL | Status: DC | PRN
  Administered 2019-08-10 – 2019-08-11 (×4): 100 mg via ORAL
  Filled 2019-08-10 (×4): qty 2

## 2019-08-10 MED ORDER — NITROGLYCERIN IN D5W 200-5 MCG/ML-% IV SOLN: 0.0000 ug/min | INTRAVENOUS | Status: DC

## 2019-08-10 MED ORDER — ACETAMINOPHEN 650 MG RE SUPP: 650.0000 mg | Freq: Four times a day (QID) | RECTAL | Status: DC | PRN

## 2019-08-10 MED ORDER — LIDOCAINE HCL (CARDIAC) PF 100 MG/5ML IV SOSY
PREFILLED_SYRINGE | INTRAVENOUS | Status: DC | PRN
Start: 2019-08-10 — End: 2019-08-10
  Administered 2019-08-10: 100 mg via INTRAVENOUS

## 2019-08-10 MED ORDER — FENTANYL CITRATE (PF) 100 MCG/2ML IJ SOLN
INTRAMUSCULAR | Status: AC
  Filled 2019-08-10: qty 2

## 2019-08-10 MED ORDER — PHENYLEPHRINE HCL-NACL 20-0.9 MG/250ML-% IV SOLN
0.0000 ug/min | INTRAVENOUS | Status: DC
  Filled 2019-08-10: qty 250

## 2019-08-10 MED ORDER — LEVOTHYROXINE SODIUM 88 MCG PO TABS
88.0000 ug | ORAL_TABLET | Freq: Every day | ORAL | Status: DC
  Administered 2019-08-11: 88 ug via ORAL
  Filled 2019-08-10: qty 1

## 2019-08-10 MED ORDER — CHLORHEXIDINE GLUCONATE 0.12 % MT SOLN
OROMUCOSAL | Status: AC
  Administered 2019-08-10: 15 mL via OROMUCOSAL
  Filled 2019-08-10: qty 15

## 2019-08-10 MED ORDER — MORPHINE SULFATE (PF) 4 MG/ML IV SOLN: 1.0000 mg | INTRAVENOUS | Status: DC | PRN

## 2019-08-10 MED ORDER — FENTANYL CITRATE (PF) 100 MCG/2ML IJ SOLN
INTRAMUSCULAR | Status: DC | PRN
  Administered 2019-08-10 (×5): 25 ug via INTRAVENOUS

## 2019-08-10 MED ORDER — CHLORHEXIDINE GLUCONATE 4 % EX LIQD: 60.0000 mL | Freq: Once | CUTANEOUS | Status: DC

## 2019-08-10 MED ORDER — LIDOCAINE HCL (PF) 1 % IJ SOLN
INTRAMUSCULAR | Status: AC
  Filled 2019-08-10: qty 60

## 2019-08-10 MED ORDER — PROTAMINE SULFATE 10 MG/ML IV SOLN
INTRAVENOUS | Status: DC | PRN
Start: 2019-08-10 — End: 2019-08-10
  Administered 2019-08-10: 80 mg via INTRAVENOUS

## 2019-08-10 MED ORDER — SODIUM CHLORIDE 0.9 % IV SOLN: INTRAVENOUS | Status: AC

## 2019-08-10 MED ORDER — IOHEXOL 350 MG/ML SOLN
INTRAVENOUS | Status: DC | PRN
  Administered 2019-08-10: 65 mL

## 2019-08-10 SURGICAL SUPPLY — 39 items
BAG SNAP BAND KOVER 36X36 (MISCELLANEOUS) ×6 IMPLANT
BALLN TRUE 18X4.5 (BALLOONS) ×3
BALLOON TRUE 18X4.5 (BALLOONS) ×2 IMPLANT
BLANKET WARM UNDERBOD FULL ACC (MISCELLANEOUS) ×3 IMPLANT
CABLE ADAPT PACING TEMP 12FT (ADAPTER) ×3 IMPLANT
CATH INFINITI 5FR ANG PIGTAIL (CATHETERS) ×6 IMPLANT
CATH INFINITI 6F AL1 (CATHETERS) ×3 IMPLANT
CATH S G BIP PACING (CATHETERS) ×3 IMPLANT
DEVICE CLOSURE PERCLS PRGLD 6F (VASCULAR PRODUCTS) ×4 IMPLANT
DRYSEAL FLEXSHEATH 14FR 33CM (SHEATH) ×1
GUIDEWIRE CNFDA BRKR CVD (WIRE) ×3 IMPLANT
GUIDEWIRE SAFE TJ AMPLATZ EXST (WIRE) ×3 IMPLANT
KIT DILATOR VASC 18G NDL (KITS) ×3 IMPLANT
KIT ENCORE 26 ADVANTAGE (KITS) ×3 IMPLANT
KIT HEART LEFT (KITS) ×3 IMPLANT
KIT MICROPUNCTURE NIT STIFF (SHEATH) ×3 IMPLANT
PACK CARDIAC CATHETERIZATION (CUSTOM PROCEDURE TRAY) ×3 IMPLANT
PERCLOSE PROGLIDE 6F (VASCULAR PRODUCTS) ×6
SHEATH BRITE TIP 7FR 35CM (SHEATH) ×3 IMPLANT
SHEATH DRYSEAL FLEX 14FR 33CM (SHEATH) ×2 IMPLANT
SHEATH PINNACLE 6F 10CM (SHEATH) ×3 IMPLANT
SHEATH PINNACLE 8F 10CM (SHEATH) ×3 IMPLANT
SHEATH PROBE COVER 6X72 (BAG) ×3 IMPLANT
SLEEVE REPOSITIONING LENGTH 30 (MISCELLANEOUS) ×3 IMPLANT
STOPCOCK MORSE 400PSI 3WAY (MISCELLANEOUS) ×6 IMPLANT
SYS DEL EVOLUT PROPLS 23 26 29 (CATHETERS) ×3
SYS LOAD EVOLT PROPLS 23 26 29 (CATHETERS) ×3
SYSTEM DEL EVLT PRPLS 23 26 29 (CATHETERS) ×2 IMPLANT
SYSTEM LOAD EVLT PRPLS23 26 29 (CATHETERS) ×2 IMPLANT
TRANSDUCER W/STOPCOCK (MISCELLANEOUS) ×6 IMPLANT
TUBE CONN 8.8X1320 FR HP M-F (CONNECTOR) ×3 IMPLANT
TUBING ART PRESS 72  MALE/FEM (TUBING) ×3
TUBING ART PRESS 72 MALE/FEM (TUBING) ×2 IMPLANT
TUBING CIL FLEX 10 FLL-RA (TUBING) ×3 IMPLANT
VALVE AORTIC EVOLUT PROPLUS 26 (Valve) ×3 IMPLANT
WIRE AMPLATZ SS-J .035X180CM (WIRE) ×3 IMPLANT
WIRE EMERALD 3MM-J .035X150CM (WIRE) ×3 IMPLANT
WIRE EMERALD 3MM-J .035X260CM (WIRE) ×3 IMPLANT
WIRE EMERALD ST .035X260CM (WIRE) ×3 IMPLANT

## 2019-08-10 NOTE — Discharge Instructions (Signed)
ACTIVITY AND EXERCISE °• Daily activity and exercise are an important part of your recovery. People recover at different rates depending on their general health and type of valve procedure. °• Most people recovering from TAVR feel better relatively quickly  °• No lifting, pushing, pulling more than 10 pounds (examples to avoid: groceries, vacuuming, gardening, golfing): °            - For one week with a procedure through the groin. °            - For six weeks for procedures through the chest wall or neck. °NOTE: You will typically see one of our providers 7-14 days after your procedure to discuss WHEN TO RESUME the above activities.  °  °  °DRIVING °• Do not drive until you are seen for follow up and cleared by a provider. Generally, we ask patient to not drive for 1 week after their procedure. °• If you have been told by your doctor in the past that you may not drive, you must talk with him/her before you begin driving again. °  °DRESSING °• Groin site: you may leave the clear dressing over the site for up to one week or until it falls off. °  °HYGIENE °• If you had a femoral (leg) procedure, you may take a shower when you return home. After the shower, pat the site dry. Do NOT use powder, oils or lotions in your groin area until the site has completely healed. °• If you had a chest procedure, you may shower when you return home unless specifically instructed not to by your discharging practitioner. °            - DO NOT scrub incision; pat dry with a towel. °            - DO NOT apply any lotions, oils, powders to the incision. °            - No tub baths / swimming for at least 2 weeks. °• If you notice any fevers, chills, increased pain, swelling, bleeding or pus, please contact your doctor. °  °ADDITIONAL INFORMATION °• If you are going to have an upcoming dental procedure, please contact our office as you will require antibiotics ahead of time to prevent infection on your heart valve.  ° ° °If you have any  questions or concerns you can call the structural heart phone during normal business hours 8am-4pm. If you have an urgent need after hours or weekends please call [phone] to talk to the on call provider for general cardiology. If you have an emergency that requires immediate attention, please call 911.  ° ° °After TAVR Checklist ° °Check  Test Description  ° Follow up appointment in 1-2 weeks  You will see our structural heart physician assistant, Katie Rondey Fallen. Your incision sites will be checked and you will be cleared to drive and resume all normal activities if you are doing well.    ° 1 month echo and follow up  You will have an echo to check on your new heart valve and be seen back in the office by Katie Skarlet Lyons. Many times the echo is not read by your appointment time, but Katie will call you later that day or the following day to report your results.  ° Follow up with your primary cardiologist You will need to be seen by your primary cardiologist in the following 3-6 months after your 1 month appointment in the valve   clinic. Often times your Plavix or Aspirin will be discontinued during this time, but this is decided on a case by case basis.   ° 1 year echo and follow up You will have another echo to check on your heart valve after 1 year and be seen back in the office by Katie Novia Lansberry. This your last structural heart visit.  ° Bacterial endocarditis prophylaxis  You will have to take antibiotics for the rest of your life before all dental procedures (even teeth cleanings) to protect your heart valve. Antibiotics are also required before some surgeries. Please check with your cardiologist before scheduling any surgeries. Also, please make sure to tell us if you have a penicillin allergy as you will require an alternative antibiotic.   ° ° °

## 2019-08-10 NOTE — Op Note (Addendum)
[facility] V[facility]ULAR CENTER   MULTIDISCIPLINARY HEART VALVE TEAM   TAVR OPERATIVE NOTE   Date of Procedure:  08/10/2019  Preoperative Diagnosis: Severe Aortic Stenosis   Postoperative Diagnosis: Same   Procedure:    Transcatheter Aortic Valve Replacement - Percutaneous Transfemoral Approach  Medtronic CoreValve Evolut Pro (26 mm, Serial # [id number])   Co-Surgeons:  Valentina Gu. Roxy Manns, MD and [name], MD  Anesthesiologist:  Midge Minium, MD  Echocardiographer:  Jenkins Rouge, MD  Pre-operative Echo Findings:  Severe aortic stenosis  Normal left ventricular systolic function  Secundum ASD with dilated right heart and severe TR  Post-operative Echo Findings:  trivial paravalvular leak  unchanged left ventricular systolic function  BRIEF CLINICAL NOTE AND INDICATIONS FOR SURGERY  74 year old woman with history of chronic lower extremity edema, severe aortic stenosis, recently diagnosed secundum ASD, presenting today for TAVR.  She has undergone extensive preoperative evaluation including both surface echo and TEE studies.  She underwent right and left heart catheterization demonstrating no obstructive coronary artery disease.  She was found to have a significant oxygen step up suspicious for left to right shunting.  Transesophageal echo confirmed a secundum ASD.  CT angiography was notable for the presence of a Sievers 1 bicuspid aortic valve with fusion of the right and left cusps and a large ostium secundum ASD.  Pelvic vasculature was felt to be adequate for transfemoral TAVR access.  During the course of the patient's preoperative work up they have been evaluated comprehensively by a multidisciplinary team of specialists coordinated through the [facility] in the [facility].  They have been demonstrated to suffer from symptomatic severe aortic stenosis as noted above. The patient has been counseled extensively as to  the relative risks and benefits of all options for the treatment of severe aortic stenosis including long term medical therapy, conventional surgery for aortic valve replacement, and transcatheter aortic valve replacement.  The patient has been independently evaluated in formal cardiac surgical consultation by Dr Roxy Manns, who deemed the patient appropriate for TAVR. Based upon review of all of the patient's preoperative diagnostic tests they are felt to be candidate for transcatheter aortic valve replacement using the transfemoral approach as an alternative to conventional surgery.    Following the decision to proceed with transcatheter aortic valve replacement, a discussion has been held regarding what types of management strategies would be attempted intraoperatively in the event of life-threatening complications, including whether or not the patient would be considered a candidate for the use of cardiopulmonary bypass and/or conversion to open sternotomy for attempted surgical intervention.  The patient has been advised of a variety of complications that might develop peculiar to this approach including but not limited to risks of death, stroke, paravalvular leak, aortic dissection or other major vascular complications, aortic annulus rupture, device embolization, cardiac rupture or perforation, acute myocardial infarction, arrhythmia, heart block or bradycardia requiring permanent pacemaker placement, congestive heart failure, respiratory failure, renal failure, pneumonia, infection, other late complications related to structural valve deterioration or migration, or other complications that might ultimately cause a temporary or permanent loss of functional independence or other long term morbidity.  The patient provides full informed consent for the procedure as described and all questions were answered preoperatively.  DETAILS OF THE OPERATIVE PROCEDURE  PREPARATION:   The patient is brought to the  operating room on the above mentioned date and central monitoring was established by the anesthesia team including placement of a central venous  catheter and radial arterial line. The patient is placed in the supine position on the operating table.  Intravenous antibiotics are administered. The patient is monitored closely throughout the procedure under conscious sedation.  Baseline transthoracic echocardiogram is performed. The patient's chest, abdomen, both groins, and both lower extremities are prepared and draped in a sterile manner. A time out procedure is performed.   PERIPHERAL ACCESS:   Using ultrasound guidance, femoral arterial and venous access is obtained with placement of 6 Fr sheaths on the left side.  A pigtail diagnostic catheter was passed through the femoral arterial sheath under fluoroscopic guidance into the aortic root.  A temporary transvenous pacemaker catheter was passed through the femoral venous sheath under fluoroscopic guidance into the right ventricle.  The pacemaker was tested to ensure stable lead placement and pacemaker capture.  The pigtail catheter is placed in the noncoronary sinus.  TRANSFEMORAL ACCESS:  A micropuncture technique is used to access the right femoral artery under fluoroscopic and ultrasound guidance.  2 Perclose devices are deployed at 10' and 2' positions to 'PreClose' the femoral artery. An 8 French sheath is placed and then an Amplatz Superstiff wire is advanced through the sheath. This is changed out for a 14 French dry seal sheath after progressively dilating over the Superstiff wire.  An AL-1 catheter was used to direct a straight-tip exchange length wire across the native aortic valve into the left ventricle. This was exchanged out for a pigtail catheter and position was confirmed in the LV apex. Simultaneous LV and Ao pressures were recorded.  The pigtail catheter was exchanged for an Confida wire in the LV apex.    BALLOON AORTIC VALVULOPLASTY:   Performed with an 18 mm Bard true balloon under rapid pacing.  Patient's hemodynamic recovery is slow.  The balloon is removed without complication.  TRANSCATHETER HEART VALVE DEPLOYMENT:  Following balloon aortic valvuloplasty, the 14 French dry seal sheath is removed and changed out for the Medtronic evolute pro plus inline sheath.  Prior to valve insertion, proper preparation is confirmed under fluoroscopic evaluation.  The valve is advanced around the aortic arch maintaining proper wire position.  The valve is carefully positioned in the RAO projection using the cusp overlap technique.  The valve is deployed to 80% so that it is fully functional.  The patient developed marked hypotension with the valve across the aortic annulus and she had a slow recovery but her blood pressure stabilized over a few minutes.  Aortic root angiography is performed and demonstrates that the valve position is too deep.  The valve is fully recaptured and repositioned in a higher position.  Valve position is confirmed in multiple projections using aortic root angiography.  The valve is then fully deployed and demonstrated to be in appropriate position under both echo and fluoroscopic guidance.  The patient recovered well and remained stable throughout the remainder of the procedure. Echo confirms normal valve function with a mean gradient of 4 mmHg and trivial paravalvular regurgitation.  PROCEDURE COMPLETION:  The sheath was removed and femoral artery closure is performed using the 2 previously deployed Perclose devices.  Protamine is administered once femoral arterial repair was complete. The site is clear with no evidence of bleeding or hematoma after the sutures are tightened. The temporary pacemaker and pigtail catheters are removed.  Manual pressure was used for hemostasis after removal of the pigtail catheter and temporary transvenous pacing wires.  The patient tolerated the procedure well and is transported to the  recovery area  in stable condition. There were no immediate intraoperative complications. All sponge instrument and needle counts are verified correct at completion of the operation.   The patient received a total of 65 mL of intravenous contrast during the procedure.   [name], MD 08/10/2019 4:51 PM

## 2019-08-10 NOTE — Anesthesia Preprocedure Evaluation (Addendum)
Anesthesia Evaluation  Patient identified by MRN, date of birth, ID band Patient awake    Reviewed: Allergy & Precautions, NPO status , Patient's Chart, lab work & pertinent test results  History of Anesthesia Complications Negative for: history of anesthetic complications  Airway Mallampati: II  TM Distance: >3 FB Neck ROM: Full    Dental  (+) Caps, Dental Advisory Given   Pulmonary neg pulmonary ROS,  08/07/2019 SARS coronavirus NEG   breath sounds clear to auscultation       Cardiovascular (-) angina(-) CAD + Valvular Problems/Murmurs (large ASD with L-R shunt, severe AS) AS  Rhythm:Regular Rate:Normal  06/2019 ECHO: EF 60-65%, Sievers type bicuspid aortic valve with fusion of the RCC/LCC raphe. Vmax 4.4 m/s, mean gradient 42 mmHg, AVA by VTI 0.30 cm2. Direct  planimetry by 3D MPR 0.60 cm2. Mild AI, mod TR Large secundum ASD with L-R shunt   Neuro/Psych negative neurological ROS     GI/Hepatic negative GI ROS, Neg liver ROS,   Endo/Other  Hypothyroidism   Renal/GU negative Renal ROS     Musculoskeletal  (+) Arthritis ,   Abdominal   Peds  Hematology negative hematology ROS (+)   Anesthesia Other Findings H/o breast cancer  Reproductive/Obstetrics                           Anesthesia Physical Anesthesia Plan  ASA: IV  Anesthesia Plan: MAC   Post-op Pain Management:    Induction:   PONV Risk Score and Plan: 2 and Treatment may vary due to age or medical condition  Airway Management Planned: Natural Airway and Simple Face Mask  Additional Equipment: Arterial line  Intra-op Plan:   Post-operative Plan:   Informed Consent: I have reviewed the patients History and Physical, chart, labs and discussed the procedure including the risks, benefits and alternatives for the proposed anesthesia with the patient or authorized representative who has indicated his/her understanding and  acceptance.     Dental advisory given  Plan Discussed with: CRNA and Surgeon  Anesthesia Plan Comments:        Anesthesia Quick Evaluation

## 2019-08-10 NOTE — Progress Notes (Signed)
  [facility]   MULTIDISCIPLINARY HEART VALVE TEAM  Patient doing well s/p TAVR. She is hemodynamically stable. Groin sites stable. ECG with sinus and no high grade block. Arterial line discontinued and transferred to 4E. Plan for early ambulation after bedrest completed and hopeful discharge over the next 24-48 hours.   Stori Royse PA-C  [facility]  Pager [phone]  

## 2019-08-10 NOTE — Progress Notes (Signed)
Pt L hand/wrist IV infiltrated, pt noticed burning that started approx 10 minutes ago. Was not edematous/infiltrated on 8pm assessment. IV removed, arm elevated on pillow, heat pack provided. Will continue to monitor.

## 2019-08-10 NOTE — Op Note (Signed)
[facility]   MULTIDISCIPLINARY HEART VALVE TEAM   TAVR OPERATIVE NOTE   Date of Procedure:  08/10/2019  Preoperative Diagnosis: Severe Aortic Stenosis   Postoperative Diagnosis: Same   Procedure:    Transcatheter Aortic Valve Replacement - Percutaneous Right Transfemoral Approach  Medtronic CoreValve Evolut Pro (size 26 mm, serial # [id number])   Co-Surgeons:  [name], MD and Valentina Gu. Roxy Manns, MD   Anesthesiologist:  Midge Minium, MD  Echocardiographer:  Jenkins Rouge, MD  Pre-operative Echo Findings:  Severe aortic stenosis  Mild left ventricular systolic dysfunction  Atrial septal defect  Moderate-severe right ventricular dysfunction  Moderate-severe tricuspid regurgitation  Post-operative Echo Findings:  Trivial paravalvular leak  Unchanged left ventricular systolic function   BRIEF CLINICAL NOTE AND INDICATIONS FOR SURGERY  Patient is a 74 year old female with history of aortic stenosis, breast cancer status post mastectomy, hypothyroidism, and severe chronic venous insufficiency with chronic lower extremity edema who has been referred for surgical consultation to discuss treatment options for management of severe symptomatic aortic stenosis.  Patient recently presented with worsening symptoms of chronic lower extremity edema with painful ulcerations in her legs.  She was found to have a heart murmur and echocardiogram performed at [facility] on March 02, 2019 revealed severe aortic stenosis and normal left ventricular systolic function.  By report there was normal left ventricular systolic function with ejection fraction estimated 55 to 60%.  Peak velocity across the aortic valve was reported 4.1 m/s corresponding to mean transvalvular gradient estimated 48 mmHg.  There was mild to moderate tricuspid regurgitation present with normal right ventricular size and systolic function.  She was initially evaluated by Dr. Harriet Masson and  subsequently referred to Dr. Burt Knack.  Initially the patient did not wish to proceed with further diagnostic work-up for management of her aortic stenosis because of her ongoing symptoms of severe pain in both legs.  She was seen in consultation by Dr. Trula Slade at [facility] who recommended compression stockings for both lower legs.  She was seen in follow-up by Nell Range earlier this month and subsequently underwent diagnostic cardiac catheterization by Dr. Burt Knack on Jun 09, 2019.  Catheterization revealed normal coronary artery anatomy with no significant coronary artery disease.  There was severe aortic stenosis.  Mean transvalvular gradient was measured 38 mmHg at catheterization corresponding to valve area calculated 1.0 cm.  There was mild pulmonary hypertension.  There was also elevated oxygen saturation in the right heart consistent with possible left to right shunt with Qp/Qs calculated 1.7:1.0 suggestive of arterial venous mixing.  CT angiography was performed and the patient referred for surgical consultation.  During the course of the patient's preoperative work up they have been evaluated comprehensively by a multidisciplinary team of specialists coordinated through the [facility] in the [facility].  They have been demonstrated to suffer from symptomatic severe aortic stenosis as noted above. The patient has been counseled extensively as to the relative risks and benefits of all options for the treatment of severe aortic stenosis including long term medical therapy, conventional surgery for aortic valve replacement, and transcatheter aortic valve replacement.  All questions have been answered, and the patient provides full informed consent for the operation as described.   DETAILS OF THE OPERATIVE PROCEDURE  PREPARATION:    The patient is brought to the operating room on the above mentioned date and central monitoring  was established by the anesthesia team including placement of a  central venous line and radial arterial line. The patient is placed in the supine position on the operating table.  Intravenous antibiotics are administered. The patient is monitored closely throughout the procedure under conscious sedation.  Baseline transthoracic echocardiogram was performed. The patient's chest, abdomen, both groins, and both lower extremities are prepared and draped in a sterile manner. A time out procedure is performed.   PERIPHERAL ACCESS:    Using the modified Seldinger technique, femoral arterial and venous access was obtained with placement of 6 Fr sheaths on the left side.  A pigtail diagnostic catheter was passed through the left arterial sheath under fluoroscopic guidance into the aortic root.  The pigtail catheter is positioned in the non-coronary sinus of Valsalva.  A temporary transvenous pacemaker catheter was passed through the left femoral venous sheath under fluoroscopic guidance into the right ventricle.  The pacemaker was tested to ensure stable lead placement and pacemaker capture. Aortic root angiography was performed in order to determine the optimal angiographic angle for valve deployment.   TRANSFEMORAL ACCESS:   Percutaneous transfemoral access and sheath placement was performed by Dr. Burt Knack using ultrasound guidance.  The right common femoral artery was cannulated using a micropuncture needle and appropriate location was verified using hand injection angiogram.  A pair of Abbott Perclose percutaneous closure devices were placed and a 6 French sheath replaced into the femoral artery.  The patient was heparinized systemically and ACT verified > 250 seconds.    A 14 Fr transfemoral Gore DrySeal sheath was introduced into the right common femoral artery after progressively dilating over an Amplatz superstiff wire. An AL-1 catheter was used to direct a straight-tip exchange length wire across the  native aortic valve into the left ventricle. This was exchanged out for a pigtail catheter and position was confirmed in the LV apex. The pigtail catheter was exchanged for Confida wire in the LV apex.  Echocardiography was utilized to confirm appropriate wire position and no sign of entanglement in the mitral subvalvular apparatus.  Once the Confida wire was in the LV the patient had a tendency to develop PVC's which were not tolerated well, causing significant hypotension.  Blood pressure recovered slowly with manipulation of the wire to minimize ectopy.   BALLOON AORTIC VALVULOPLASTY:   Balloon aortic valvuloplasty was performed using a 18 mm valvuloplasty balloon.  Once optimal position was achieved, BAV was done under rapid ventricular pacing.  Hemodynamics slowly returned to baseline.   TRANSCATHETER HEART VALVE DEPLOYMENT:   A Medtronic CoreValve Evolut Pro transcatheter heart valve (size26 mm, serial #[id number]) was prepared and crimped per manufacturer's guidelines, and the proper loading of the valve is confirmed on the [facility] Pro delivery system using flouroscopy. The DrySeal sheath was removed and the valve and delivery system were advanced over the guidewire, through the iliac arteries and aorta, and advanced across the aortic arch using flouroscopy. The valve was carefully positioned across the aortic valve annulus. Once appropriate position of the valve has been confirmed by angiographic assessment, the valve is deployed gradually to 80%, at which time a second aortogram was performed to confirm the appropriate depth and position of deployment.  Valve function was evaluated using echocardiography.  Initially the valve looked too low and there was some paravalvular leak.  The valve was recaptured and repositioned and again deployed to 80%.  At this time valve position and function looked good.  Once final position was confirmed, deployment was completed, the valve released, and the delivery  system carefully removed  from the aortic root. Valve function is assessed using echocardiography. There is felt to be no paravalvular leak and no central aortic insufficiency.  The patient's hemodynamic recovery following valve deployment is rapid and uneventful.     PROCEDURE COMPLETION:   The deployment system is and guidewire were removed and femoral artery closure performed by securing the Perclose sutures.  Protamine was administered once femoral arterial repair was complete. The temporary pacemaker was removed.  The pigtail catheters and femoral sheaths were removed with manual pressure used for hemostasis.   The patient tolerated the procedure well and is transported to the surgical intensive care in stable condition. There were no immediate intraoperative complications. All sponge instrument and needle counts are verified correct at completion of the operation.   No blood products were administered during the operation.     Rexene Alberts, MD 08/10/2019 3:10 PM

## 2019-08-10 NOTE — Transfer of Care (Signed)
Immediate Anesthesia Transfer of Care Note  Patient: Denise Bailey  Procedure(s) Performed: TRANSCATHETER AORTIC VALVE REPLACEMENT, TRANSFEMORAL (N/A Chest) TRANSESOPHAGEAL ECHOCARDIOGRAM (TEE) (N/A )  Patient Location: Cath Lab  Anesthesia Type:MAC  Level of Consciousness: drowsy and patient cooperative  Airway & Oxygen Therapy: Patient Spontanous Breathing  Post-op Assessment: Report given to RN, Post -op Vital signs reviewed and stable and Patient moving all extremities X 4  Post vital signs: Reviewed and stable  Last Vitals:  Vitals Value Taken Time  BP 105/51 08/10/19 1547  Temp 36.5 C 08/10/19 1546  Pulse 85 08/10/19 1548  Resp 22 08/10/19 1548  SpO2 98 % 08/10/19 1548  Vitals shown include unvalidated device data.  Last Pain:  Vitals:   08/10/19 1546  TempSrc: Temporal  PainSc: 0-No pain         Complications: No complications documented.

## 2019-08-10 NOTE — Anesthesia Procedure Notes (Signed)
Procedure Name: MAC Date/Time: 08/10/2019 1:15 PM Performed by: Janace Litten, CRNA Pre-anesthesia Checklist: Patient identified, Emergency Drugs available, Suction available and Patient being monitored Patient Re-evaluated:Patient Re-evaluated prior to induction Oxygen Delivery Method: Simple face mask

## 2019-08-10 NOTE — Progress Notes (Signed)
Pt arrived to rm 15 from cath lab. Initiated pt on tele. Assessment and CHG bath given. VSS. Call bell within reach.   Lavenia Atlas, RN

## 2019-08-10 NOTE — Interval H&P Note (Signed)
History and Physical Interval Note:  08/10/2019 11:42 AM  Denise Bailey  has presented today for surgery, with the diagnosis of Severe Aortic Stenosis.  The various methods of treatment have been discussed with the patient and family. After consideration of risks, benefits and other options for treatment, the patient has consented to  Procedure(s): TRANSCATHETER AORTIC VALVE REPLACEMENT, TRANSFEMORAL (N/A) TRANSESOPHAGEAL ECHOCARDIOGRAM (TEE) (N/A) as a surgical intervention.  The patient's history has been reviewed, patient examined, no change in status, stable for surgery.  I have reviewed the patient's chart and labs.  Questions were answered to the patient's satisfaction.    Pt stable for TAVR. Minimally symptomatic but low functional capacity. BNP elevated. Overall picture c/w NYHA functional Class II chronic diastolic HF.   [name]

## 2019-08-10 NOTE — Anesthesia Procedure Notes (Signed)
Arterial Line Insertion Start/End7/20/2021 11:20 AM Performed by: Josephine Igo, CRNA, CRNA  Patient location: Pre-op. Preanesthetic checklist: patient identified, IV checked, site marked, risks and benefits discussed, surgical consent, monitors and equipment checked, pre-op evaluation, timeout performed and anesthesia consent Lidocaine 1% used for infiltration Right, radial was placed Catheter size: 20 G Hand hygiene performed  and maximum sterile barriers used   Attempts: 2 Procedure performed without using ultrasound guided technique. Following insertion, dressing applied and Biopatch. Post procedure assessment: normal  Patient tolerated the procedure well with no immediate complications.

## 2019-08-10 NOTE — Anesthesia Postprocedure Evaluation (Signed)
Anesthesia Post Note  Patient: Denise Bailey  Procedure(s) Performed: TRANSCATHETER AORTIC VALVE REPLACEMENT, TRANSFEMORAL (N/A Chest) TRANSESOPHAGEAL ECHOCARDIOGRAM (TEE) (N/A )     Patient location during evaluation: PACU Anesthesia Type: MAC Level of consciousness: awake and alert Pain management: pain level controlled Vital Signs Assessment: post-procedure vital signs reviewed and stable Respiratory status: spontaneous breathing, nonlabored ventilation, respiratory function stable and patient connected to nasal cannula oxygen Cardiovascular status: stable and blood pressure returned to baseline Postop Assessment: no apparent nausea or vomiting Anesthetic complications: no   No complications documented.  Last Vitals:  Vitals:   08/10/19 1655 08/10/19 1723  BP: (!) 114/56 120/65  Pulse: 81 88  Resp: (!) 21 20  Temp:  36.6 C  SpO2: 100% 99%    Last Pain:  Vitals:   08/10/19 1723  TempSrc: Oral  PainSc:                  [name]

## 2019-08-10 NOTE — Progress Notes (Signed)
  Echocardiogram 2D Echocardiogram has been performed.  Geoffery Lyons Swaim 08/10/2019, 3:11 PM

## 2019-08-11 ENCOUNTER — Encounter (HOSPITAL_COMMUNITY): Payer: Self-pay | Admitting: Cardiovascular Disease

## 2019-08-11 ENCOUNTER — Inpatient Hospital Stay (HOSPITAL_COMMUNITY): Payer: PPO

## 2019-08-11 DIAGNOSIS — Z952 Presence of prosthetic heart valve: Secondary | ICD-10-CM

## 2019-08-11 DIAGNOSIS — I5033 Acute on chronic diastolic (congestive) heart failure: Secondary | ICD-10-CM

## 2019-08-11 HISTORY — DX: Acute on chronic diastolic (congestive) heart failure: I50.33

## 2019-08-11 LAB — CBC
HCT: 34.5 % — ABNORMAL LOW (ref 36.0–46.0)
Hemoglobin: 10.7 g/dL — ABNORMAL LOW (ref 12.0–15.0)
MCH: 30.1 pg (ref 26.0–34.0)
MCHC: 31 g/dL (ref 30.0–36.0)
MCV: 97.2 fL (ref 80.0–100.0)
Platelets: 131 10*3/uL — ABNORMAL LOW (ref 150–400)
RBC: 3.55 MIL/uL — ABNORMAL LOW (ref 3.87–5.11)
RDW: 15.1 % (ref 11.5–15.5)
WBC: 5 10*3/uL (ref 4.0–10.5)
nRBC: 0 % (ref 0.0–0.2)

## 2019-08-11 LAB — ECHOCARDIOGRAM LIMITED
AR max vel: 1.6 cm2
AV Area VTI: 1.83 cm2
AV Area mean vel: 1.68 cm2
AV Mean grad: 6 mmHg
AV Peak grad: 9.4 mmHg
Ao pk vel: 1.54 m/s
Height: 61 in
Weight: 1925.94 oz

## 2019-08-11 LAB — POCT I-STAT, CHEM 8
BUN: 12 mg/dL (ref 8–23)
Calcium, Ion: 1.26 mmol/L (ref 1.15–1.40)
Chloride: 107 mmol/L (ref 98–111)
Creatinine, Ser: 0.5 mg/dL (ref 0.44–1.00)
Glucose, Bld: 94 mg/dL (ref 70–99)
HCT: 34 % — ABNORMAL LOW (ref 36.0–46.0)
Hemoglobin: 11.6 g/dL — ABNORMAL LOW (ref 12.0–15.0)
Potassium: 3.9 mmol/L (ref 3.5–5.1)
Sodium: 145 mmol/L (ref 135–145)
TCO2: 23 mmol/L (ref 22–32)

## 2019-08-11 LAB — BASIC METABOLIC PANEL
Anion gap: 9 (ref 5–15)
BUN: 12 mg/dL (ref 8–23)
CO2: 25 mmol/L (ref 22–32)
Calcium: 8.5 mg/dL — ABNORMAL LOW (ref 8.9–10.3)
Chloride: 106 mmol/L (ref 98–111)
Creatinine, Ser: 0.88 mg/dL (ref 0.44–1.00)
GFR calc Af Amer: >60 mL/min (ref >60)
GFR calc non Af Amer: >60 mL/min (ref >60)
Glucose, Bld: 92 mg/dL (ref 70–99)
Potassium: 3.1 mmol/L — ABNORMAL LOW (ref 3.5–5.1)
Sodium: 140 mmol/L (ref 135–145)

## 2019-08-11 LAB — MAGNESIUM: Magnesium: 1.7 mg/dL (ref 1.7–2.4)

## 2019-08-11 MED ORDER — FUROSEMIDE 40 MG PO TABS
40.0000 mg | ORAL_TABLET | Freq: Every day | ORAL | 6 refills | Status: DC
Start: 2019-08-11 — End: 2021-08-22

## 2019-08-11 MED ORDER — POTASSIUM CHLORIDE CRYS ER 20 MEQ PO TBCR: 20.0000 meq | EXTENDED_RELEASE_TABLET | Freq: Once | ORAL | 6 refills | Status: DC

## 2019-08-11 MED ORDER — CLOPIDOGREL BISULFATE 75 MG PO TABS: 75.0000 mg | ORAL_TABLET | Freq: Every day | ORAL | 1 refills | Status: DC

## 2019-08-11 MED ORDER — POTASSIUM CHLORIDE CRYS ER 20 MEQ PO TBCR
80.0000 meq | EXTENDED_RELEASE_TABLET | Freq: Once | ORAL | Status: AC
  Administered 2019-08-11: 80 meq via ORAL
  Filled 2019-08-11: qty 4

## 2019-08-11 MED ORDER — FUROSEMIDE 10 MG/ML IJ SOLN
20.0000 mg | Freq: Once | INTRAMUSCULAR | Status: AC
  Administered 2019-08-11: 20 mg via INTRAVENOUS
  Filled 2019-08-11: qty 2

## 2019-08-11 MED FILL — Heparin Sod (Porcine)-NaCl IV Soln 1000 Unit/500ML-0.9%: INTRAVENOUS | Qty: 500 | Status: AC

## 2019-08-11 NOTE — Progress Notes (Addendum)
Pt walked approx 50 ft w/ rolling walker. Assisted to chair. Tolerated well. Groin sites stable.   0600 Walked another 50 ft around in room, standby assist.. Tolerating well.

## 2019-08-11 NOTE — Discharge Summary (Addendum)
[facility] VALVE TEAM  Discharge Summary    Patient ID: Denise Bailey MRN: [id number]; DOB: [birth year]  Admit date: 08/10/2019 Discharge date: 08/11/2019  Primary Care Provider: Penelope Coop, FNP  Primary Cardiologist: Berniece Salines, DO / Dr. Burt Knack & Dr. Roxy Manns (TAVR)  Discharge Diagnoses    Principal Problem:   S/P TAVR (transcatheter aortic valve replacement) Active Problems:   Severe aortic stenosis   Bilateral leg edema   Venous insufficiency   Atrial septal defect   Hypothyroidism   Acute on chronic diastolic heart failure (HCC)   Allergies No Known Allergies  Diagnostic Studies/Procedures    TAVR OPERATIVE NOTE  Date of Procedure:                08/10/2019  Preoperative Diagnosis:      Severe Aortic Stenosis   Postoperative Diagnosis:    Same   Procedure:        Transcatheter Aortic Valve Replacement - Percutaneous Right Transfemoral Approach             Medtronic CoreValve Evolut Pro (size 26 mm, serial # [id number])              Co-Surgeons:                        [name], MD and Valentina Gu. Roxy Manns, MD   Anesthesiologist:                  Midge Minium, MD  Echocardiographer:              Jenkins Rouge, MD  Pre-operative Echo Findings: ? Severe aortic stenosis ? Mild left ventricular systolic dysfunction ? Atrial septal defect ? Moderate-severe right ventricular dysfunction ? Moderate-severe tricuspid regurgitation  Post-operative Echo Findings: ? Trivial paravalvular leak ? Unchanged left ventricular systolic function  _____________   Echo 08/11/19: completed but pending formal read at the time of discharge    History of Present Illness     Denise Bailey is a 74 y.o. female with a history of breast cancer s/p mastectomy, hypothyroidism, severe venous insufficiency with chronic LE edema and severe AS who presented to [facility] on 08/10/19 for planned TAVR.   Patient recently  presented with worsening symptoms of chronic lower extremity edema with painful ulcerations in her legs. She was found to have a heart murmur and echocardiogram performed at [facility] on March 02, 2019 revealed severe aortic stenosis and normal left ventricular systolic function.  By report there was normal left ventricular systolic function with ejection fraction estimated 55 to 60%.  Peak velocity across the aortic valve was reported 4.1 m/s corresponding to mean transvalvular gradient estimated 48 mmHg.  There was mild to moderate tricuspid regurgitation present with normal right ventricular size and systolic function.  She was initially evaluated by Dr. Harriet Masson and subsequently referred to Dr. Burt Knack.  Initially the patient did not wish to proceed with further diagnostic work-up for management of her aortic stenosis because of her ongoing symptoms of severe pain in both legs. She was seen in consultation by Dr. Trula Slade at [facility] who recommended compression stockings for both lower legs. She called back in wanting to proceed with TAVR work up and subsequently underwent diagnostic cardiac catheterization by Dr. Burt Knack on Jun 09, 2019. Catheterization revealed normal coronary artery anatomy with no significant coronary artery disease. There was severe aortic stenosis.  Mean transvalvular gradient was  measured 38 mmHg at catheterization corresponding to valve area calculated 1.0 cm. There was mild pulmonary hypertension. There was also elevated oxygen saturation in the right heart consistent with possible left to right shunt with Qp/Qs calculated 1.7:1.0 suggestive of arterial venous mixing.  CT angiography showed large secundum ASD. Follow up TEE confirmed ASD with adequate rims for closure. Plans were made for TAVR followed by percutaneous ASD closure. The patient was referred to the wound care clinic for her legs and she had significant improvement in her LE edema with wounds.    The patient has been evaluated by the multidisciplinary valve team and felt to have severe, symptomatic aortic stenosis and to be a suitable candidate for TAVR, which was set up for 08/10/19.   Hospital Course     Consultants: none  Severe AS: s/p successful TAVR with a 26 mm Medtronic Evolut Pro+ THV via the TF approach on 08/10/19. Post operative echo completed but pending formal read. Groin sites are stable. ECG with sinus with PACs. There has been no evidence of high grade heart block. Continue Asprin and started on plavix 75mg  daily. Plan for discharge home today with close follow up in the office next week.    Acute on chronic diastolic CHF: as evidenced by an elevated BNP on pre admission lab work and signs of volume overload on exam. This has been treated with TAVR. Will treat with one dose of IV lasix 20mg  today. Will discharge her on lasix 40mg  daily.   Hypokalemia: supplemented with 80 mg Kdur today. Will discharge on 20 Meq daily. BMET next week.  Chronic LE edema: continue follow up with the wound care center. We are hopeful that this will improve after ASD closure. Continue tramadol for pain.   ASD with right heart dilation: will plan for staged percutaneous ASD closure next month.  _____________  Discharge Vitals Blood pressure (!) 117/55, pulse 90, temperature 97.9 F (36.6 C), temperature source Oral, resp. rate 15, height 5\' 1"  (1.549 m), weight 54.6 kg, SpO2 97 %.  Filed Weights   08/10/19 1009 08/11/19 0111  Weight: 55.2 kg 54.6 kg    GEN: Well nourished, well developed, in no acute distress HEENT: normal Neck: no JVD or masses Cardiac: RRR; soft flow murmur. No rubs, or gallops. 1 + bilateral LE edema with healing sores Respiratory:  clear to auscultation bilaterally, normal work of breathing GI: soft, nontender, nondistended, + BS MS: no deformity or atrophy Skin: warm and dry, no rash.  Groin sites clear without hematoma or ecchymosis  Neuro:  Alert and  Oriented x 3, Strength and sensation are intact Psych: euthymic mood, full affect    Labs & Radiologic Studies    CBC Recent Labs    08/10/19 1551 08/11/19 0452  WBC  --  5.0  HGB 11.6* 10.7*  HCT 34.0* 34.5*  MCV  --  97.2  PLT  --  161*   Basic Metabolic Panel Recent Labs    08/10/19 1551 08/11/19 0452  NA 145 140  K 3.9 3.1*  CL 107 106  CO2  --  25  GLUCOSE 94 92  BUN 12 12  CREATININE 0.50 0.88  CALCIUM  --  8.5*  MG  --  1.7   Liver Function Tests No results for input(s): AST, ALT, ALKPHOS, BILITOT, PROT, ALBUMIN in the last 72 hours. No results for input(s): LIPASE, AMYLASE in the last 72 hours. Cardiac Enzymes No results for input(s): CKTOTAL, CKMB, CKMBINDEX, TROPONINI in the last  72 hours. BNP Invalid input(s): POCBNP D-Dimer No results for input(s): DDIMER in the last 72 hours. Hemoglobin A1C No results for input(s): HGBA1C in the last 72 hours. Fasting Lipid Panel No results for input(s): CHOL, HDL, LDLCALC, TRIG, CHOLHDL, LDLDIRECT in the last 72 hours. Thyroid Function Tests No results for input(s): TSH, T4TOTAL, T3FREE, THYROIDAB in the last 72 hours.  Invalid input(s): FREET3 _____________  DG Chest 2 View  Result Date: 08/06/2019 CLINICAL DATA:  Severe aortic stenosis, preoperative evaluation EXAM: CHEST - 2 VIEW COMPARISON:  02/13/2019 FINDINGS: Remote left mastectomy and axillary lymph node dissection. Stable heart size and vascularity. Minor chronic appearing left basilar scarring. No focal pneumonia, collapse or consolidation. Negative for edema, effusion or pneumothorax. Trachea midline. Aorta atherosclerotic. Degenerative changes of the spine with mild thoracolumbar scoliosis. No acute osseous finding. IMPRESSION: Stable chest exam with chronic postoperative findings as above. No acute chest process Aortic Atherosclerosis (ICD10-I70.0). Electronically Signed   By: Jerilynn Mages.  Shick M.D.   On: 08/06/2019 09:59   VAS US CAROTID  Result Date:  08/05/2019 Carotid Arterial Duplex Study Indications:       Pre tavr. Comparison Study:  no prior Performing Technologist: Abram Sander RVS  Examination Guidelines: A complete evaluation includes B-mode imaging, spectral Doppler, color Doppler, and power Doppler as needed of all accessible portions of each vessel. Bilateral testing is considered an integral part of a complete examination. Limited examinations for reoccurring indications may be performed as noted.  Right Carotid Findings: +----------+--------+--------+--------+------------------+--------+           PSV cm/sEDV cm/sStenosisPlaque DescriptionComments +----------+--------+--------+--------+------------------+--------+ CCA Prox  84      26              heterogenous               +----------+--------+--------+--------+------------------+--------+ CCA Distal70      22              heterogenous               +----------+--------+--------+--------+------------------+--------+ ICA Prox  69      30      1-39%   heterogenous               +----------+--------+--------+--------+------------------+--------+ ICA Distal75      31                                         +----------+--------+--------+--------+------------------+--------+ ECA       60      7                                          +----------+--------+--------+--------+------------------+--------+ +----------+--------+-------+--------+-------------------+           PSV cm/sEDV cmsDescribeArm Pressure (mmHG) +----------+--------+-------+--------+-------------------+ [id number]                                         +----------+--------+-------+--------+-------------------+ +---------+--------+--+--------+--+---------+ VertebralPSV cm/s60EDV cm/s15Antegrade +---------+--------+--+--------+--+---------+  Left Carotid Findings: +----------+--------+--------+--------+------------------+--------+           PSV cm/sEDV cm/sStenosisPlaque  DescriptionComments +----------+--------+--------+--------+------------------+--------+ CCA Prox  83      20              heterogenous               +----------+--------+--------+--------+------------------+--------+  CCA Distal68      17              heterogenous               +----------+--------+--------+--------+------------------+--------+ ICA Prox  61      24      1-39%   heterogenous               +----------+--------+--------+--------+------------------+--------+ ICA Distal83      40                                         +----------+--------+--------+--------+------------------+--------+ ECA       64      10                                         +----------+--------+--------+--------+------------------+--------+ +----------+--------+--------+--------+-------------------+           PSV cm/sEDV cm/sDescribeArm Pressure (mmHG) +----------+--------+--------+--------+-------------------+ [id number]                                          +----------+--------+--------+--------+-------------------+ +---------+--------+--+--------+--+---------+ VertebralPSV cm/s45EDV cm/s19Antegrade +---------+--------+--+--------+--+---------+   Summary: Right Carotid: Velocities in the right ICA are consistent with a 1-39% stenosis. Left Carotid: Velocities in the left ICA are consistent with a 1-39% stenosis. Vertebrals: Bilateral vertebral arteries demonstrate antegrade flow. *See table(s) above for measurements and observations.  Electronically signed by Monica Martinez MD on 08/05/2019 at 3:47:55 PM.    Final    ECHOCARDIOGRAM LIMITED  Result Date: 08/10/2019    ECHOCARDIOGRAM LIMITED REPORT   Patient Name:   SALOME HAUTALA Date of Exam: 08/10/2019 Medical Rec #:  [id number]           Height:       61.0 in Accession #:    [id number]          Weight:       121.7 lb Date of Birth:  Apr 13, 1945            BSA:          1.529 m Patient Age:    45 years             BP:           138/72 mmHg Patient Gender: F                   HR:           95 bpm. Exam Location:  Inpatient Procedure: Limited Echo, Cardiac Doppler and Color Doppler Indications:     Aortic valve stenosis 424.1 / 135.0  History:         Patient has prior history of Echocardiogram examinations, most                  recent 06/22/2019. Risk Factors:Non-Smoker. ASD.                  Aortic Valve: Medtronic, stented (TAVR) valve is present in the                  aortic position. Procedure Date: 08/10/2019.  Sonographer:     Vickie Epley RDCS Sonographer#2:   Dustin Flock Referring Phys:  [id number] Curt Bears  R THOMPSON Diagnosing Phys: Jenkins Rouge MD IMPRESSIONS  1. Abnormal septal motion from LBBB. Left ventricular ejection fraction, by estimation, is 45 to 50%. The left ventricle has mildly decreased function. The left ventricle demonstrates global hypokinesis.  2. Right ventricular systolic function is moderately reduced. The right ventricular size is moderately enlarged.  3. Left atrial size was mildly dilated.  4. Right atrial size was severely dilated.  5. Mild mitral valve regurgitation.  6. Tricuspid valve regurgitation is severe.  7. Pre TAVR: Bicuspid AV with fused right and left cusps Severe AS with peak gradient 80 mmHg mean 52 mmHg AVA 0.33 and trivial AR         Post TAVR: well positioned supra annular medtronic 26 mm Evolut Pro valve. Peak velocity 1.4 m/sec peak gradient 8 mmHg mean 4 mmHg AVA 3.0 cm2 trivial peri valvular regurgitation . The aortic valve is bicuspid. Severe aortic valve stenosis. There is  a Medtronic, stented (TAVR) valve present in the aortic position. Procedure Date: 08/10/2019.  8. Known secundum ASD. FINDINGS  Left Ventricle: Abnormal septal motion from LBBB. Left ventricular ejection fraction, by estimation, is 45 to 50%. The left ventricle has mildly decreased function. The left ventricle demonstrates global hypokinesis. Right Ventricle: The right ventricular size is moderately  enlarged. Right ventricular systolic function is moderately reduced. Left Atrium: Left atrial size was mildly dilated. Right Atrium: Right atrial size was severely dilated. Pericardium: There is no evidence of pericardial effusion. Mitral Valve: There is mild thickening of the mitral valve leaflet(s). There is mild calcification of the mitral valve leaflet(s). Moderate mitral annular calcification. Mild mitral valve regurgitation. Tricuspid Valve: Tricuspid valve regurgitation is severe. Aortic Valve: Pre TAVR: Bicuspid AV with fused right and left cusps Severe AS with peak gradient 80 mmHg mean 52 mmHg AVA 0.33 and trivial AR Post TAVR: well positioned supra annular medtronic 26 mm Evolut Pro valve. Peak velocity 1.4 m/sec peak gradient 8 mmHg mean 4 mmHg AVA 3.0 cm2 trivial peri valvular regurgitation. The aortic valve is bicuspid. Severe aortic stenosis is present. Aortic valve mean gradient measures 4.0 mmHg. Aortic valve peak gradient measures 7.6 mmHg. Aortic valve area, by VTI measures 2.99 cm. There is a Medtronic, stented (TAVR) valve present in the aortic position. Procedure Date: 08/10/2019. Pulmonic Valve: The pulmonic valve was not assessed. Aorta: Aortic root could not be assessed. IAS/Shunts: Known secundum ASD. LEFT VENTRICLE PLAX 2D LVOT diam:     1.90 cm LV SV:         73 LV SV Index:   48 LVOT Area:     2.84 cm  AORTIC VALVE AV Area (Vmax):    2.55 cm AV Area (Vmean):   2.28 cm AV Area (VTI):     2.99 cm AV Vmax:           138.00 cm/s AV Vmean:          93.100 cm/s AV VTI:            0.244 m AV Peak Grad:      7.6 mmHg AV Mean Grad:      4.0 mmHg LVOT Vmax:         124.00 cm/s LVOT Vmean:        74.900 cm/s LVOT VTI:          0.257 m LVOT/AV VTI ratio: 1.05 TRICUSPID VALVE TR Peak grad:   39.4 mmHg TR Vmax:        314.00 cm/s  SHUNTS Systemic VTI:  0.26 m Systemic Diam: 1.90 cm Jenkins Rouge MD Electronically signed by Jenkins Rouge MD Signature Date/Time: 08/10/2019/3:34:40 PM    Final     Structural Heart Procedure  Result Date: 08/10/2019 See surgical note for result.  Disposition   Pt is being discharged home today in good condition.  Follow-up Plans & Appointments     Follow-up Information    Eileen Stanford, PA-C. Go on 08/19/2019.   Specialties: Cardiology, Radiology Why: @ 1pm, please arrive at least 10 minutes early Contact information: [place] [address] [phone]                Discharge Medications   Allergies as of 08/11/2019   No Known Allergies     Medication List    TAKE these medications   acetaminophen 325 MG tablet Commonly known as: TYLENOL Take 325-650 mg by mouth every 6 (six) hours as needed for mild pain or headache.   aspirin EC 81 MG tablet Take 1 tablet (81 mg total) by mouth daily. Swallow whole.   clopidogrel 75 MG tablet Commonly known as: PLAVIX Take 1 tablet (75 mg total) by mouth daily with breakfast.   fluticasone 50 MCG/ACT nasal spray Commonly known as: FLONASE Place 1 spray into both nostrils daily as needed for allergies.   furosemide 40 MG tablet Commonly known as: Lasix Take 1 tablet (40 mg total) by mouth daily.   levothyroxine 88 MCG tablet Commonly known as: SYNTHROID Take 88 mcg by mouth daily before breakfast.   potassium chloride SA 20 MEQ tablet Commonly known as: Klor-Con M20 Take 1 tablet (20 mEq total) by mouth once for 1 dose.   traMADol 50 MG tablet Commonly known as: ULTRAM Take 50-100 mg by mouth 3 (three) times daily as needed.           Outstanding Labs/Studies   BMET  Duration of Discharge Encounter   Greater than 30 minutes including physician time.  Mable Fill, PA-C 08/11/2019, 9:38 AM [phone]  Patient seen, examined. Available data reviewed. Agree with findings, assessment, and plan as outlined by Nell Range, PA-C. On my exam: Vitals:   08/11/19 0331 08/11/19 0755  BP: (!) 90/51 (!) 117/55   Pulse: 85 90  Resp: 19 15  Temp: 97.8 F (36.6 C) 97.9 F (36.6 C)  SpO2: 99% 97%   Pt is alert and oriented, NAD HEENT: normal Neck: JVP - normal Lungs: CTA bilaterally CV: RRR with 2/6 SEM at the RUSB, no diastolic murmur Abd: soft, NT, Positive BS, no hepatomegaly Ext: 1+ BL pretibial edema, distal pulses intact and equal, BL groin sites clear Skin: erythematous lower legs unchanged  Echo with normal TAVR valve function with mean gradient 4 mmHg and no PVL. Plan as outlined above. Tele shows stable heart rhythm with no significant heart block or bradycardia. [place] for home today with follow-up as outlined. Plan transcatheter ASD closure once she fully recovers from TAVR.   [name], M.D. 08/11/2019 5:38 PM

## 2019-08-11 NOTE — Progress Notes (Signed)
CARDIAC REHAB PHASE I   PRE:  Rate/Rhythm: 89 SR  BP:  Sitting: 111/60      SaO2: 95 RA  MODE:  Ambulation: 75 ft   POST:  Rate/Rhythm: 118 ST  BP:  Sitting: 126/77    SaO2: 96 RA  Pt ambulated 52ft in hallway standby assist with front wheel walker. Pt denies pain or SOB, states her legs feel some better. Pt educated on site care and restrictions. Encouraged continued ambulation with emphasis on safety. Not appropriate for CRP II at this time. Pt states a good support system and denies DME needs.  [phone] Rufina Falco, RN BSN 08/11/2019 10:11 AM

## 2019-08-11 NOTE — Progress Notes (Signed)
Pt assisted to stand for weight this AM- requests short walk later in the morning. R groin site lvl 0, L groin site lvl 1 w/ light bruising and gauze dressing w/ old bloody drainage, not outside of marking placed by day shift RN. Will continue to monitor.

## 2019-08-11 NOTE — Progress Notes (Signed)
  Echocardiogram 2D Echocardiogram has been performed.  Denise Bailey 08/11/2019, 11:50 AM

## 2019-08-12 ENCOUNTER — Telehealth: Payer: Self-pay | Admitting: Physician Assistant

## 2019-08-12 NOTE — Telephone Encounter (Signed)
  [facility] VALVE TEAM   Patient contacted regarding discharge from [facility] on 08/11/19  Patient understands to follow up with provider Nell Range on 7/29 at [place].  Patient understands discharge instructions? yes Patient understands medications and regimen? yes Patient understands to bring all medications to this visit? yes  Angelena Form PA-C  [facility]

## 2019-08-13 ENCOUNTER — Telehealth: Payer: Self-pay | Admitting: Cardiovascular Disease

## 2019-08-13 NOTE — Telephone Encounter (Signed)
Agree! She is cleared to get second vaccine without any pre medication.

## 2019-08-13 NOTE — Telephone Encounter (Signed)
Pt advised that she does not need abx prior to Covid vaccine. Aware that I will forward this to K. Grandville Silos to make sure there is no issue w/ getting 2nd vaccine on 7/30 (TAVR 7/20). Advised to keep her follow up w/ Grandville Silos on 7/29. Aware we will call if they feel like she will need to reschedule her 2nd shot. Patient verbalized understanding and agreeable to plan.

## 2019-08-13 NOTE — Telephone Encounter (Signed)
Denise Bailey is calling wanting to know if she is needing any antibiotics prior to get her second dose of the Covid vaccine on 08/20/19 due to her recent procedure. Please advise.

## 2019-08-19 ENCOUNTER — Encounter: Payer: Self-pay | Admitting: Physician Assistant

## 2019-08-19 ENCOUNTER — Ambulatory Visit: Payer: PPO | Admitting: Physician Assistant

## 2019-08-19 ENCOUNTER — Other Ambulatory Visit: Payer: Self-pay

## 2019-08-19 VITALS — BP 112/62 | HR 97 | Ht 61.0 in | Wt 120.0 lb

## 2019-08-19 DIAGNOSIS — R600 Localized edema: Secondary | ICD-10-CM

## 2019-08-19 DIAGNOSIS — Z952 Presence of prosthetic heart valve: Secondary | ICD-10-CM

## 2019-08-19 DIAGNOSIS — I5032 Chronic diastolic (congestive) heart failure: Secondary | ICD-10-CM | POA: Diagnosis not present

## 2019-08-19 DIAGNOSIS — Q211 Atrial septal defect, unspecified: Secondary | ICD-10-CM

## 2019-08-19 DIAGNOSIS — E876 Hypokalemia: Secondary | ICD-10-CM

## 2019-08-19 MED ORDER — DOXYCYCLINE HYCLATE 100 MG PO TABS: 100.0000 mg | ORAL_TABLET | Freq: Two times a day (BID) | ORAL | 0 refills | Status: AC

## 2019-08-19 MED ORDER — AMOXICILLIN 500 MG PO TABS
ORAL_TABLET | ORAL | 11 refills | Status: DC
Start: 2019-08-19 — End: 2021-10-29

## 2019-08-19 NOTE — Progress Notes (Signed)
[facility]                                      Cardiology Office Note    Date:  08/19/2019   ID:  Denise Bailey, DOB [birth year], MRN [id number]  PCP:  Penelope Coop, FNP  Cardiologist:  Dr. Harriet Masson / Dr. Burt Knack & Dr. Roxy Manns (TAVR)  CC: TOC s/p TAVR.   History of Present Illness:  Denise Bailey is a 74 y.o. female with a history of breast cancer s/p mastectomy, hypothyroidism, chronic LE edema with wounds, ASD and severe aortic stenosis s/p TAVR (08/10/19) who presents to clinic for follow up.   Patient recently presented with worsening symptoms of chronic lower extremity edema with painful ulcerations in her legs. She was found to have a heart murmur and echocardiogram performed at [facility] on March 02, 2019 revealed severe aortic stenosis and normal left ventricular systolic function. By report there was normal left ventricular systolic function with ejection fraction estimated 55 to 60%. Peak velocity across the aortic valve was reported 4.1 m/s corresponding to mean transvalvular gradient estimated 48 mmHg. There was mild to moderate tricuspid regurgitation present with normal right ventricular size and systolic function. She was initially evaluated by Dr. Dolores Hoose subsequently referred to Dr. Burt Knack. Initially the patient did not wish to proceed with further diagnostic work-up for management of her aortic stenosis because of her ongoing symptoms of severe pain in both legs. She was seen in consultation by Dr. Trula Slade at [facility] who recommended compression stockings for both lower legs. She called back in wanting to proceed with TAVR work up and subsequently underwent diagnostic cardiac catheterization by Dr. Burt Knack on Jun 09, 2019. Catheterization revealed normal coronary artery anatomy with no significant coronary artery disease. There was severe aortic stenosis. Mean transvalvular gradient was  measured 38 mmHg at catheterization corresponding to valve area calculated 1.0 cm. There was mild pulmonary hypertension. There was also elevated oxygen saturation in the right heart consistent with possible left to right shunt with Qp/Qs calculated 1.7:1.0suggestive of arterial venous mixing. CT angiography showed large secundum ASD. Follow up TEE confirmed ASD with adequate rims for closure. Plans were made for TAVR followed by percutaneous ASD closure. The patient was referred to the wound care clinic for her legs and she had significant improvement in her LE edema with wounds.   She was evaluated by the multidisciplinary valve team and underwent successful TAVR with a 26 mm Medtronic Evolut Pro+ THV via the TF approach on 08/10/19. Post operative echo showed EF 50-55%, normally functioning TAVR with a mean gradient of 6 mm hg and no PVL. There was severe TR and moderate RVE. She was discharged on aspirin and plavix. She was also started on a potassium supplement for hypokalemia.   Today she presents to clinic for follow up. She is here with her friend. Having left hand pain and swelling. Thinks it's where an IV was placed last week. Also has a knot behind her right knee that is painful. Does not appreciate any significant change since TAVR. Still mostly limited by lower extremity pain.     Past Medical History:  Diagnosis Date  . Arthritis   . ASD (atrial septal defect)   . Bilateral leg edema 03/15/2019  . Breast cancer ([place])   . Complication of anesthesia  patient reportd having difficult time waking up from anesthesia  . History of breast cancer    s/p mastectomy   . Hypothyroidism   . S/P TAVR (transcatheter aortic valve replacement) 08/10/2019   s/p TAVR with a 26 mm Medtronic Evolut Pro + via the TF approach by Dr. Burt Knack and Roxy Manns   . Severe aortic stenosis 03/15/2019    Past Surgical History:  Procedure Laterality Date  . ABDOMINAL HYSTERECTOMY    . MASTECTOMY    .  RIGHT/LEFT HEART CATH AND CORONARY ANGIOGRAPHY N/A 06/09/2019   Procedure: RIGHT/LEFT HEART CATH AND CORONARY ANGIOGRAPHY;  Surgeon: [name], MD;  Location: [facility];  Service: Cardiovascular;  Laterality: N/A;  . TEE WITHOUT CARDIOVERSION N/A 06/22/2019   Procedure: TRANSESOPHAGEAL ECHOCARDIOGRAM (TEE);  Surgeon: Geralynn Rile, MD;  Location: [facility];  Service: Cardiovascular;  Laterality: N/A;  . TEE WITHOUT CARDIOVERSION N/A 08/10/2019   Procedure: TRANSESOPHAGEAL ECHOCARDIOGRAM (TEE);  Surgeon: [name], MD;  Location: [facility];  Service: Open Heart Surgery;  Laterality: N/A;  . TRANSCATHETER AORTIC VALVE REPLACEMENT, TRANSFEMORAL N/A 08/10/2019   Procedure: TRANSCATHETER AORTIC VALVE REPLACEMENT, TRANSFEMORAL;  Surgeon: [name], MD;  Location: [facility];  Service: Open Heart Surgery;  Laterality: N/A;    Current Medications: Outpatient Medications Prior to Visit  Medication Sig Dispense Refill  . acetaminophen (TYLENOL) 325 MG tablet Take 325-650 mg by mouth every 6 (six) hours as needed for mild pain or headache.    Marland Kitchen aspirin EC 81 MG tablet Take 1 tablet (81 mg total) by mouth daily. Swallow whole. 90 tablet 3  . clopidogrel (PLAVIX) 75 MG tablet Take 1 tablet (75 mg total) by mouth daily with breakfast. 90 tablet 1  . fluticasone (FLONASE) 50 MCG/ACT nasal spray Place 1 spray into both nostrils daily as needed for allergies.    . furosemide (LASIX) 40 MG tablet Take 1 tablet (40 mg total) by mouth daily. 30 tablet 6  . levothyroxine (SYNTHROID) 88 MCG tablet Take 88 mcg by mouth daily before breakfast.     . potassium chloride (KLOR-CON) 10 MEQ tablet Take 20 mEq by mouth daily.    . traMADol (ULTRAM) 50 MG tablet Take 50-100 mg by mouth 3 (three) times daily as needed.    . potassium chloride SA (KLOR-CON M20) 20 MEQ tablet Take 1 tablet (20 mEq total) by mouth once for 1 dose. 30 tablet 6   No facility-administered  medications prior to visit.     Allergies:   Patient has no known allergies.   Social History   Socioeconomic History  . Marital status: Married    Spouse name: Not on file  . Number of children: Not on file  . Years of education: Not on file  . Highest education level: Not on file  Occupational History  . Not on file  Tobacco Use  . Smoking status: Never Smoker  . Smokeless tobacco: Never Used  Vaping Use  . Vaping Use: Never used  Substance and Sexual Activity  . Alcohol use: Never  . Drug use: Never  . Sexual activity: Not on file  Other Topics Concern  . Not on file  Social History Narrative  . Not on file   Social Determinants of Health   Financial Resource Strain:   . Difficulty of Paying Living Expenses:   Food Insecurity:   . Worried About Charity fundraiser in the Last Year:   . Arboriculturist in  the Last Year:   Transportation Needs:   . Film/video editor (Medical):   Marland Kitchen Lack of Transportation (Non-Medical):   Physical Activity:   . Days of Exercise per Week:   . Minutes of Exercise per Session:   Stress:   . Feeling of Stress :   Social Connections:   . Frequency of Communication with Friends and Family:   . Frequency of Social Gatherings with Friends and Family:   . Attends Religious Services:   . Active Member of Clubs or Organizations:   . Attends Archivist Meetings:   Marland Kitchen Marital Status:      Family History:  The patient's family history is not on file.     ROS:   Please see the history of present illness.    ROS All other systems reviewed and are negative.   PHYSICAL EXAM:   VS:  BP (!) 112/62   Pulse 97   Ht 5\' 1"  (1.549 m)   Wt 120 lb (54.4 kg)   SpO2 99%   BMI 22.67 kg/m    GEN: Well nourished, well developed, in no acute distress HEENT: normal Neck: no JVD or masses Cardiac: RRR; 3/6 SEM. No rubs, or gallops. 2+ bilateral LE edema with erythema and blistering Respiratory:  clear to auscultation bilaterally,  normal work of breathing GI: soft, nontender, nondistended, + BS MS: no deformity or atrophy Skin: warm and dry, no rash. Right hand swollen, tender and erythmatous Neuro:  Alert and Oriented x 3, Strength and sensation are intact Psych: euthymic mood, full affect   Wt Readings from Last 3 Encounters:  08/19/19 120 lb (54.4 kg)  08/11/19 120 lb 5.9 oz (54.6 kg)  08/05/19 121 lb 12.8 oz (55.2 kg)      Studies/Labs Reviewed:   EKG:  EKG is ordered today.  The ekg ordered today demonstrates sinus with frequent PACs HR 97  Recent Labs: 03/29/2019: NT-Pro BNP 737 08/05/2019: ALT 17; B Natriuretic Peptide 289.4 08/11/2019: BUN 12; Creatinine, Ser 0.88; Hemoglobin 10.7; Magnesium 1.7; Platelets 131; Potassium 3.1; Sodium 140   Lipid Panel No results found for: CHOL, TRIG, HDL, CHOLHDL, VLDL, LDLCALC, LDLDIRECT  Additional studies/ records that were reviewed today include:  TAVR OPERATIVE NOTE  Date of Procedure:08/10/2019  Preoperative Diagnosis:Severe Aortic Stenosis   Postoperative Diagnosis:Same   Procedure:   Transcatheter Aortic Valve Replacement - PercutaneousRightTransfemoral Approach Medtronic CoreValve Evolut Pro (size 68mm, serial # [id number])  Co-Surgeons:Michael Burt Knack, MD andClarence H. Roxy Manns, MD   Anesthesiologist:E. Annye Asa, MD  Echocardiographer:Peter Johnsie Cancel, MD  Pre-operative Echo Findings: ? Severe aortic stenosis ? Mildleft ventricular systolic dysfunction ? Atrial septal defect ? Moderate-severe right ventricular dysfunction ? Moderate-severe tricuspid regurgitation  Post-operative Echo Findings: ? Trivialparavalvular leak ? Unchangedleft ventricular systolic function  _____________   Echo 08/11/19: IMPRESSIONS  1. Abnormal septal motion inferior basal hypokinesis . Left ventricular  ejection fraction, by  estimation, is 50 to 55%. The left ventricle has low  normal function.  2. Limited echo previous echo with moderate RVE and hypokinesis . Right  ventricular systolic function was not well visualized. The right  ventricular size is not well visualized.  3. Not assessed on this limited echo Known large secundum ASA.  4. Trivial mitral valve regurgitation.  5. Tricuspid valve regurgitation is severe.  6. Post TAVR with 26 mm Medtronic Evolut Pro valve In good position with  no significant PvL Peak velocity 1.5 m/sec mean gradient 6 peak 9.4 mmHg  with AVA 1.8 cm2 DVI  0.76. The aortic valve has been repaired/replaced.   ASSESSMENT & PLAN:   Severe AS s/p TAVR: doing okay. Still has significant LE edema. Groin sites healing well. ECG with no HAVB. Continue aspirin and plavix. SBE prophylaxis discussed; I have RX'd amoxicillin. I will see her back next month for follow up and echo. At that time, we will set her up for ASD closure which is tentatively planned for 9/3.  Chronic diastolic CHF: appears euvolemic aside from chronic LE edema. Continue lasix 40mg  daily. Bmet today   Hypokalemia: on Kdur 28mEq daily. Will check BMET today  Chronic LE edema: continue follow up with the wound care center. We are hopeful that this will improve after ASD closure. Continue tramadol for pain.   ASD with right heart dilation: will plan for staged percutaneous ASD closure next month-  tentatively planned for 9/3.  Right hand swelling: there is pain and erythema as well. Likely 2/2 IV infiltration. I have asked her to elevate hand. I will Rx doxy 100mg  BID x 10 days.   Leg pain: has painful knot behind right knee. I suggested LE dopplers to rule out DVT but she would like to wait. She will call us back if this does not improve.   Medication Adjustments/Labs and Tests Ordered: Current medicines are reviewed at length with the patient today.  Concerns regarding medicines are outlined above.  Medication  changes, Labs and Tests ordered today are listed in the Patient Instructions below. Patient Instructions  Medication Instructions:  1) START DOXYCYCLINE 100 mg twice daily for 10 days 2) Your provider discussed the importance of taking an antibiotic prior to all dental visits to prevent damage to the heart valves from infection. You were given a prescription for AMOXIL 2,000 mg to take one hour prior to any dental appointment.   *If you need a refill on your cardiac medications before your next appointment, please call your pharmacy*  Lab Work: TODAY: BMET If you have labs (blood work) drawn today and your tests are completely normal, you will receive your results only by: Marland Kitchen MyChart Message (if you have MyChart) OR . A paper copy in the mail If you have any lab test that is abnormal or we need to change your treatment, we will call you to review the results.  Testing/Procedures: The tentative date for your closure is 09/24/2019. You will get instructions at your upcoming appointment 9/1.  Follow-Up: Please keep your appointments as scheduled. We will see you 09/22/2019!    Signed, Angelena Form, PA-C  08/19/2019 3:32 PM    [facility] [place], [place], [place]  [address] Phone: [phone]; Fax: [phone]

## 2019-08-19 NOTE — Patient Instructions (Addendum)
Medication Instructions:  1) START DOXYCYCLINE 100 mg twice daily for 10 days 2) Your provider discussed the importance of taking an antibiotic prior to all dental visits to prevent damage to the heart valves from infection. You were given a prescription for AMOXIL 2,000 mg to take one hour prior to any dental appointment.   *If you need a refill on your cardiac medications before your next appointment, please call your pharmacy*  Lab Work: TODAY: BMET If you have labs (blood work) drawn today and your tests are completely normal, you will receive your results only by: Marland Kitchen MyChart Message (if you have MyChart) OR . A paper copy in the mail If you have any lab test that is abnormal or we need to change your treatment, we will call you to review the results.  Testing/Procedures: The tentative date for your closure is 09/24/2019. You will get instructions at your upcoming appointment 9/1.  Follow-Up: Please keep your appointments as scheduled. We will see you 09/22/2019!

## 2019-08-20 LAB — BASIC METABOLIC PANEL
BUN/Creatinine Ratio: 16 (ref 12–28)
BUN: 11 mg/dL (ref 8–27)
CO2: 26 mmol/L (ref 20–29)
Calcium: 9.1 mg/dL (ref 8.7–10.3)
Chloride: 96 mmol/L (ref 96–106)
Creatinine, Ser: 0.68 mg/dL (ref 0.57–1.00)
GFR calc Af Amer: 99 mL/min/{1.73_m2} (ref >59)
GFR calc non Af Amer: 86 mL/min/{1.73_m2} (ref >59)
Glucose: 78 mg/dL (ref 65–99)
Potassium: 4.2 mmol/L (ref 3.5–5.2)
Sodium: 137 mmol/L (ref 134–144)

## 2019-08-27 ENCOUNTER — Telehealth: Payer: Self-pay

## 2019-08-27 NOTE — Telephone Encounter (Signed)
-----   Message from Eileen Stanford, PA-C sent at 08/20/2019  8:29 AM EDT ----- Labs are normal. Continue on same meds

## 2019-08-27 NOTE — Telephone Encounter (Signed)
The patient has been notified of the result and verbalized understanding.  All questions (if any) were answered. Wilma Flavin, RN 08/27/2019 10:31 AM

## 2019-09-21 ENCOUNTER — Telehealth: Payer: Self-pay | Admitting: *Deleted

## 2019-09-21 NOTE — Telephone Encounter (Addendum)
Spoke with pt to let her know time for ASD closure 09/24/19 7:30 AM, pt should arrive at [facility]  5:30AM.  Pt knows to keep appointments at [facility] 09/22/19 echocardiogram 2 PM and Nell Range, PA  09/22/19 3 PM Pt is aware she will need pre-procedure BMP/CBC/COVID-19 test.

## 2019-09-22 ENCOUNTER — Ambulatory Visit (HOSPITAL_COMMUNITY): Payer: PPO | Attending: Cardiovascular Disease

## 2019-09-22 ENCOUNTER — Other Ambulatory Visit: Payer: Self-pay

## 2019-09-22 ENCOUNTER — Ambulatory Visit: Payer: PPO | Admitting: Physician Assistant

## 2019-09-22 ENCOUNTER — Encounter: Payer: Self-pay | Admitting: Physician Assistant

## 2019-09-22 VITALS — BP 124/70 | HR 81 | Ht 61.0 in | Wt 121.0 lb

## 2019-09-22 DIAGNOSIS — Q211 Atrial septal defect: Secondary | ICD-10-CM | POA: Diagnosis not present

## 2019-09-22 DIAGNOSIS — I071 Rheumatic tricuspid insufficiency: Secondary | ICD-10-CM | POA: Diagnosis not present

## 2019-09-22 DIAGNOSIS — R600 Localized edema: Secondary | ICD-10-CM | POA: Diagnosis not present

## 2019-09-22 DIAGNOSIS — Z952 Presence of prosthetic heart valve: Secondary | ICD-10-CM | POA: Diagnosis not present

## 2019-09-22 DIAGNOSIS — I5032 Chronic diastolic (congestive) heart failure: Secondary | ICD-10-CM | POA: Diagnosis not present

## 2019-09-22 DIAGNOSIS — Q2111 Secundum atrial septal defect: Secondary | ICD-10-CM

## 2019-09-22 LAB — ECHOCARDIOGRAM COMPLETE
AR max vel: 1.84 cm2
AV Area VTI: 2.62 cm2
AV Area mean vel: 1.91 cm2
AV Mean grad: 3.8 mmHg
AV Peak grad: 6.6 mmHg
Ao pk vel: 1.28 m/s
Area-P 1/2: 4.46 cm2
S' Lateral: 2.2 cm

## 2019-09-22 NOTE — Progress Notes (Addendum)
[facility]                                      Cardiology Office Note    Date:  09/22/2019   ID:  Kerilyn Cortner, DOB 1945-03-09, MRN [id number]  PCP:  Penelope Coop, FNP  Cardiologist:  Dr. Harriet Masson / Dr. Burt Knack & Dr. Roxy Manns (TAVR)  CC: 1 month s/p TAVR & set up ASD closure   History of Present Illness:  Denise Bailey is a 74 y.o. female with a history of breast cancer s/p mastectomy, hypothyroidism, chronic LE edema with wounds, ASD, severe TR and severe aortic stenosis s/p TAVR (08/10/19) who presents to clinic for follow up.   Patient recently presented with worsening symptoms of chronic lower extremity edema with painful ulcerations in her legs. She was found to have a heart murmur and echocardiogram performed at [facility] on March 02, 2019 revealed severe aortic stenosis and normal left ventricular systolic function. By report there was normal left ventricular systolic function with ejection fraction estimated 55 to 60%. Peak velocity across the aortic valve was reported 4.1 m/s corresponding to mean transvalvular gradient estimated 48 mmHg. There was mild to moderate tricuspid regurgitation present with normal right ventricular size and systolic function. She was initially evaluated by Dr. Dolores Hoose subsequently referred to Dr. Burt Knack. Initially the patient did not wish to proceed with further diagnostic work-up for management of her aortic stenosis because of her ongoing symptoms of severe pain in both legs. She was seen in consultation by Dr. Trula Slade at [facility] who recommended compression stockings for both lower legs. She called back in wanting to proceed with TAVR work up and subsequently underwent diagnostic cardiac catheterization by Dr. Burt Knack on Jun 09, 2019. Catheterization revealed normal coronary artery anatomy with no significant coronary artery disease. There was severe aortic stenosis.  Mean transvalvular gradient was measured 38 mmHg at catheterization corresponding to valve area calculated 1.0 cm. There was mild pulmonary hypertension. There was also elevated oxygen saturation in the right heart consistent with possible left to right shunt with Qp/Qs calculated 1.7:1.0suggestive of arterial venous mixing. CT angiography showed large secundum ASD. Follow up TEE confirmed ASD with adequate rims for closure. Plans were made for TAVR followed by percutaneous ASD closure. The patient was referred to the wound care clinic for her legs and she had significant improvement in her LE edema with wounds.   She was evaluated by the multidisciplinary valve team and underwent successful TAVR with a 26 mm Medtronic Evolut Pro+ THV via the TF approach on 08/10/19. Post operative echo showed EF 50-55%, normally functioning TAVR with a mean gradient of 6 mm hg and no PVL. There was severe TR and moderate RVE. She was discharged on aspirin and plavix. She was also started on a potassium supplement for hypokalemia.  In follow up she was treated for a possible arm cellulitis. She continues to have significant LE edema and no real symptom improvement since TAVR. Follow up labs showed potassium in good range.  Today she presents to clinic for follow up. No CP or SOB. Stable chronic LE edema. No orthopnea or PND. No dizziness or syncope. No blood in stool or urine. No palpitations.    Past Medical History:  Diagnosis Date  . Arthritis   . ASD (atrial septal defect)   .  Bilateral leg edema 03/15/2019  . Breast cancer ([place])   . Complication of anesthesia    patient reportd having difficult time waking up from anesthesia  . History of breast cancer    s/p mastectomy   . Hypothyroidism   . S/P TAVR (transcatheter aortic valve replacement) 08/10/2019   s/p TAVR with a 26 mm Medtronic Evolut Pro + via the TF approach by Dr. Burt Knack and Roxy Manns   . Severe aortic stenosis 03/15/2019    Past Surgical History:   Procedure Laterality Date  . ABDOMINAL HYSTERECTOMY    . MASTECTOMY    . RIGHT/LEFT HEART CATH AND CORONARY ANGIOGRAPHY N/A 06/09/2019   Procedure: RIGHT/LEFT HEART CATH AND CORONARY ANGIOGRAPHY;  Surgeon: [name], MD;  Location: [place] [facility];  Service: Cardiovascular;  Laterality: N/A;  . TEE WITHOUT CARDIOVERSION N/A 06/22/2019   Procedure: TRANSESOPHAGEAL ECHOCARDIOGRAM (TEE);  Surgeon: Geralynn Rile, MD;  Location: [place];  Service: Cardiovascular;  Laterality: N/A;  . TEE WITHOUT CARDIOVERSION N/A 08/10/2019   Procedure: TRANSESOPHAGEAL ECHOCARDIOGRAM (TEE);  Surgeon: [name], MD;  Location: [place] [facility];  Service: Open Heart Surgery;  Laterality: N/A;  . TRANSCATHETER AORTIC VALVE REPLACEMENT, TRANSFEMORAL N/A 08/10/2019   Procedure: TRANSCATHETER AORTIC VALVE REPLACEMENT, TRANSFEMORAL;  Surgeon: [name], MD;  Location: [place] [facility];  Service: Open Heart Surgery;  Laterality: N/A;    Current Medications: Outpatient Medications Prior to Visit  Medication Sig Dispense Refill  . acetaminophen (TYLENOL) 325 MG tablet Take 325-650 mg by mouth every 6 (six) hours as needed for mild pain or headache.    Marland Kitchen aspirin EC 81 MG tablet Take 1 tablet (81 mg total) by mouth daily. Swallow whole. 90 tablet 3  . clopidogrel (PLAVIX) 75 MG tablet Take 1 tablet (75 mg total) by mouth daily with breakfast. 90 tablet 1  . furosemide (LASIX) 40 MG tablet Take 1 tablet (40 mg total) by mouth daily. 30 tablet 6  . levothyroxine (SYNTHROID) 88 MCG tablet Take 88 mcg by mouth daily before breakfast.     . potassium chloride (KLOR-CON) 10 MEQ tablet Take 20 mEq by mouth daily.    . traMADol (ULTRAM) 50 MG tablet Take 50 mg by mouth 3 (three) times daily as needed for severe pain.     Marland Kitchen amoxicillin (AMOXIL) 500 MG tablet Take 4 capsules (2,000 mg) one hour prior to all dental visits. 8 tablet 11  . fluticasone (FLONASE) 50 MCG/ACT nasal spray Place 1 spray into  both nostrils daily as needed for allergies. (Patient not taking: Reported on 09/22/2019)     No facility-administered medications prior to visit.     Allergies:   Patient has no known allergies.   Social History   Socioeconomic History  . Marital status: Married    Spouse name: Not on file  . Number of children: Not on file  . Years of education: Not on file  . Highest education level: Not on file  Occupational History  . Not on file  Tobacco Use  . Smoking status: Never Smoker  . Smokeless tobacco: Never Used  Vaping Use  . Vaping Use: Never used  Substance and Sexual Activity  . Alcohol use: Never  . Drug use: Never  . Sexual activity: Not on file  Other Topics Concern  . Not on file  Social History Narrative  . Not on file   Social Determinants of Health   Financial Resource Strain:   . Difficulty of Paying Living Expenses:  Not on file  Food Insecurity:   . Worried About Charity fundraiser in the Last Year: Not on file  . Ran Out of Food in the Last Year: Not on file  Transportation Needs:   . Lack of Transportation (Medical): Not on file  . Lack of Transportation (Non-Medical): Not on file  Physical Activity:   . Days of Exercise per Week: Not on file  . Minutes of Exercise per Session: Not on file  Stress:   . Feeling of Stress : Not on file  Social Connections:   . Frequency of Communication with Friends and Family: Not on file  . Frequency of Social Gatherings with Friends and Family: Not on file  . Attends Religious Services: Not on file  . Active Member of Clubs or Organizations: Not on file  . Attends Archivist Meetings: Not on file  . Marital Status: Not on file     Family History:  The patient's family history is not on file.     ROS:   Please see the history of present illness.    ROS All other systems reviewed and are negative.   PHYSICAL EXAM:   VS:  BP 124/70   Pulse 81   Ht 5\' 1"  (1.549 m)   Wt 121 lb (54.9 kg)   BMI 22.86  kg/m    GEN: Well nourished, well developed, in no acute distress HEENT: normal Neck: no JVD or masses Cardiac: RRR; 3/6 SEM. No rubs, or gallops. 2+ bilateral LE edema with erythema and blistering Respiratory:  clear to auscultation bilaterally, normal work of breathing GI: soft, nontender, nondistended, + BS MS: no deformity or atrophy Skin: warm and dry, no rash. Right hand swollen, tender and erythmatous Neuro:  Alert and Oriented x 3, Strength and sensation are intact Psych: euthymic mood, full affect   Wt Readings from Last 3 Encounters:  09/22/19 121 lb (54.9 kg)  08/19/19 120 lb (54.4 kg)  08/11/19 120 lb 5.9 oz (54.6 kg)      Studies/Labs Reviewed:   EKG:  EKG is ordered today.  ECG shows sinus with PSC  Recent Labs: 03/29/2019: NT-Pro BNP 737 08/05/2019: ALT 17; B Natriuretic Peptide 289.4 08/11/2019: Hemoglobin 10.7; Magnesium 1.7; Platelets 131 08/19/2019: BUN 11; Creatinine, Ser 0.68; Potassium 4.2; Sodium 137   Lipid Panel No results found for: CHOL, TRIG, HDL, CHOLHDL, VLDL, LDLCALC, LDLDIRECT  Additional studies/ records that were reviewed today include:  TAVR OPERATIVE NOTE  Date of Procedure:08/10/2019  Preoperative Diagnosis:Severe Aortic Stenosis   Postoperative Diagnosis:Same   Procedure:   Transcatheter Aortic Valve Replacement - PercutaneousRightTransfemoral Approach Medtronic CoreValve Evolut Pro (size 71mm, serial # [id number])  Co-Surgeons:Michael Burt Knack, MD andClarence H. Roxy Manns, MD   Anesthesiologist:E. Annye Asa, MD  Echocardiographer:Peter Johnsie Cancel, MD  Pre-operative Echo Findings: ? Severe aortic stenosis ? Mildleft ventricular systolic dysfunction ? Atrial septal defect ? Moderate-severe right ventricular dysfunction ? Moderate-severe tricuspid regurgitation  Post-operative Echo  Findings: ? Trivialparavalvular leak ? Unchangedleft ventricular systolic function  _____________   Echo 08/11/19: IMPRESSIONS  1. Abnormal septal motion inferior basal hypokinesis . Left ventricular  ejection fraction, by estimation, is 50 to 55%. The left ventricle has low  normal function.  2. Limited echo previous echo with moderate RVE and hypokinesis . Right  ventricular systolic function was not well visualized. The right  ventricular size is not well visualized.  3. Not assessed on this limited echo Known large secundum ASA.  4. Trivial mitral valve regurgitation.  5. Tricuspid valve regurgitation is severe.  6. Post TAVR with 26 mm Medtronic Evolut Pro valve In good position with  no significant PvL Peak velocity 1.5 m/sec mean gradient 6 peak 9.4 mmHg  with AVA 1.8 cm2 DVI 0.76. The aortic valve has been repaired/replaced.   _____________________  Echo 09/22/19 IMPRESSIONS  1. Left ventricular ejection fraction, by estimation, is 55 to 60%. The left ventricle has normal function. The left ventricle has no regional wall motion abnormalities. Left ventricular diastolic parameters were normal.  2. Right ventricular systolic function is normal. The right ventricular size is normal. There is normal pulmonary artery systolic pressure.  3. Left atrial size was mildly dilated.  4. Known large secondum ASD not well seen on current study.  5. Right atrial size was moderately dilated.  6. The mitral valve is normal in structure. Trivial mitral valve regurgitation. No evidence of mitral stenosis.  7. Tricuspid valve regurgitation is severe.  8. Post TAVR July 2021 with 26 mm Medtronic Evolut Pro valve Trivial PVL which was also present on post implant echo in July Stable low gardients . The aortic valve has been repaired/replaced. Aortic valve regurgitation is not visualized. No aortic  stenosis is present. There is a 26 mm CoreValve-Evolut Pro prosthetic (TAVR) valve present  in the aortic position. Procedure Date: 08/10/19.  9. The inferior vena cava is normal in size with greater than 50% respiratory variability, suggesting right atrial pressure of 3 mmHg.  Comparison(s): 08/11/19 EF 50-55%. AV 42mmHg peak PG, 53mmHg mean PG.  ASSESSMENT & PLAN:   Severe AS s/p TAVR: echo today showed EF 55%, normally functioning TAVR with a mean gradient of 3.8 mmHg and trivial PVL. There is severe TR. She has NYHA class I symptoms and continues to be limited by her LE edema and pain. SBE prophylaxis discussed; I have RX'd amoxicillin. Continue aspirin and plavix. She will continue plavix x 60months from ASD closure (can stop 03/2020).  Chronic diastolic CHF: appears euvolemic aside from chronic LE edema which she thinks may be mildly improved since TAVR.  Continue lasix 40mg  daily.  Chronic LE edema: continue follow up with the wound care center. We are hopeful that this will improve after ASD closure. Continue tramadol for pain.   ASD with right heart dilation: will plan for staged percutaneous ASD closure on 9/3. She has been given all the information for her closure today.   Specific risks of transcatheter ASD closure are reviewed with the patient. These risks include bleeding, infection, device embolization, stroke, cardiac perforation, tamponade, arrhythmia, MI, and late device erosion. She understands these serious risks occur at low incidence of < 1%.  The patient understands the need to take dual antiplatelet therapy with aspirin and clopidogrel together for a minimum period of 6 months following transcatheter ASD closure.  The patient provides full informed consent for the procedure  Severe TR: severe by echo today. Continue medical therapy.    Medication Adjustments/Labs and Tests Ordered: Current medicines are reviewed at length with the patient today.  Concerns regarding medicines are outlined above.  Medication changes, Labs and Tests ordered today are listed in the  Patient Instructions below. Patient Instructions  COVID SCREENING INFORMATION (9/2): You are scheduled for your drive-thru COVID screening on 09/23/19 between 9AM and 11AM. Pre-Procedural COVID-19 Testing Site [address] [place], [place] [address] You will need to go home after your screening and quarantine until your procedure.   ASD CLOSURE INSTRUCTIONS (9/3): You are scheduled for an  ASD CLOSURE on Friday, September 3 with Dr. [name].  1. Please arrive at the [facility] (Main Entrance A) at [facility]: [address] [place], [place] [address] at 5:30 AM (This time is two hours before your procedure to ensure your preparation). Free valet parking service is available. You are allowed ONE visitor in the waiting room during your procedure.  Special note: Every effort is made to have your procedure done on time. Please understand that emergencies sometimes delay scheduled procedures.  2. Diet: Do not eat solid foods after midnight.  The patient may have clear liquids until 5am upon the day of the procedure.  3. Labs: TODAY! BMET, CBC  4. Medication instructions in preparation for your procedure:  1) HOLD LASIX the morning of your procedure  2) MAKE SURE TO TAKE YOUR ASPIRIN the morning of your procedure  3) MAKE SURE TO TAKE YOUR PLAVIX the morning of your procedure  4) You may take your other meds as directed with sips of water   5. Plan for one night stay--bring personal belongings. 6. Bring a current list of your medications and current insurance cards. 7. You MUST have a responsible person to drive you home. 8. Someone MUST be with you the first 24 hours after you arrive home or your discharge will be delayed. 9. Please wear clothes that are easy to get on and off and wear slip-on shoes.  FOLLOW-UP VISIT: You are scheduled for a follow-up with Dr. Harriet Masson on Thursday, October 28, 2019 at 1:20PM.     Signed, Angelena Form, PA-C  09/22/2019 [address], [place], [place]  [address] Phone: [phone]; Fax: [phone]

## 2019-09-22 NOTE — Patient Instructions (Addendum)
COVID SCREENING INFORMATION (9/2): You are scheduled for your drive-thru COVID screening on 09/23/19 between 9AM and 11AM. Pre-Procedural COVID-19 Testing Site [address] [place], [place] [address] You will need to go home after your screening and quarantine until your procedure.   ASD CLOSURE INSTRUCTIONS (9/3): You are scheduled for an ASD CLOSURE on Friday, September 3 with Dr. [name].  1. Please arrive at the [facility] (Main Entrance A) at [facility]: [address] [place], [place] [address] at 5:30 AM (This time is two hours before your procedure to ensure your preparation). Free valet parking service is available. You are allowed ONE visitor in the waiting room during your procedure.  Special note: Every effort is made to have your procedure done on time. Please understand that emergencies sometimes delay scheduled procedures.  2. Diet: Do not eat solid foods after midnight.  The patient may have clear liquids until 5am upon the day of the procedure.  3. Labs: TODAY! BMET, CBC  4. Medication instructions in preparation for your procedure:  1) HOLD LASIX the morning of your procedure  2) MAKE SURE TO TAKE YOUR ASPIRIN the morning of your procedure  3) MAKE SURE TO TAKE YOUR PLAVIX the morning of your procedure  4) You may take your other meds as directed with sips of water   5. Plan for one night stay--bring personal belongings. 6. Bring a current list of your medications and current insurance cards. 7. You MUST have a responsible person to drive you home. 8. Someone MUST be with you the first 24 hours after you arrive home or your discharge will be delayed. 9. Please wear clothes that are easy to get on and off and wear slip-on shoes.  FOLLOW-UP VISIT: You are scheduled for a follow-up with Dr. Harriet Masson on Thursday, October 28, 2019 at 1:20PM.

## 2019-09-22 NOTE — H&P (View-Only) (Signed)
[facility] [place]                                      Cardiology Office Note    Date:  09/22/2019   ID:  Joelene Barriere, DOB Oct 30, 1945, MRN [id number]  PCP:  Penelope Coop, FNP  Cardiologist:  Dr. Harriet Masson / Dr. Burt Knack & Dr. Roxy Manns (TAVR)  CC: 1 month s/p TAVR & set up ASD closure   History of Present Illness:  Denise Bailey is a 74 y.o. female with a history of breast cancer s/p mastectomy, hypothyroidism, chronic LE edema with wounds, ASD, severe TR and severe aortic stenosis s/p TAVR (08/10/19) who presents to clinic for follow up.   Patient recently presented with worsening symptoms of chronic lower extremity edema with painful ulcerations in her legs. She was found to have a heart murmur and echocardiogram performed at [facility] on March 02, 2019 revealed severe aortic stenosis and normal left ventricular systolic function. By report there was normal left ventricular systolic function with ejection fraction estimated 55 to 60%. Peak velocity across the aortic valve was reported 4.1 m/s corresponding to mean transvalvular gradient estimated 48 mmHg. There was mild to moderate tricuspid regurgitation present with normal right ventricular size and systolic function. She was initially evaluated by Dr. Dolores Hoose subsequently referred to Dr. Burt Knack. Initially the patient did not wish to proceed with further diagnostic work-up for management of her aortic stenosis because of her ongoing symptoms of severe pain in both legs. She was seen in consultation by Dr. Trula Slade at [facility] who recommended compression stockings for both lower legs. She called back in wanting to proceed with TAVR work up and subsequently underwent diagnostic cardiac catheterization by Dr. Burt Knack on Jun 09, 2019. Catheterization revealed normal coronary artery anatomy with no significant coronary artery disease. There was severe aortic stenosis.  Mean transvalvular gradient was measured 38 mmHg at catheterization corresponding to valve area calculated 1.0 cm. There was mild pulmonary hypertension. There was also elevated oxygen saturation in the right heart consistent with possible left to right shunt with Qp/Qs calculated 1.7:1.0suggestive of arterial venous mixing. CT angiography showed large secundum ASD. Follow up TEE confirmed ASD with adequate rims for closure. Plans were made for TAVR followed by percutaneous ASD closure. The patient was referred to the wound care clinic for her legs and she had significant improvement in her LE edema with wounds.   She was evaluated by the multidisciplinary valve team and underwent successful TAVR with a 26 mm Medtronic Evolut Pro+ THV via the TF approach on 08/10/19. Post operative echo showed EF 50-55%, normally functioning TAVR with a mean gradient of 6 mm hg and no PVL. There was severe TR and moderate RVE. She was discharged on aspirin and plavix. She was also started on a potassium supplement for hypokalemia.  In follow up she was treated for a possible arm cellulitis. She continues to have significant LE edema and no real symptom improvement since TAVR. Follow up labs showed potassium in good range.  Today she presents to clinic for follow up. No CP or SOB. Stable chronic LE edema. No orthopnea or PND. No dizziness or syncope. No blood in stool or urine. No palpitations.    Past Medical History:  Diagnosis Date  . Arthritis   . ASD (atrial septal defect)   .  Bilateral leg edema 03/15/2019  . Breast cancer ([place])   . Complication of anesthesia    patient reportd having difficult time waking up from anesthesia  . History of breast cancer    s/p mastectomy   . Hypothyroidism   . S/P TAVR (transcatheter aortic valve replacement) 08/10/2019   s/p TAVR with a 26 mm Medtronic Evolut Pro + via the TF approach by Dr. Burt Knack and Roxy Manns   . Severe aortic stenosis 03/15/2019    Past Surgical History:   Procedure Laterality Date  . ABDOMINAL HYSTERECTOMY    . MASTECTOMY    . RIGHT/LEFT HEART CATH AND CORONARY ANGIOGRAPHY N/A 06/09/2019   Procedure: RIGHT/LEFT HEART CATH AND CORONARY ANGIOGRAPHY;  Surgeon: [name], MD;  Location: [place] [facility];  Service: Cardiovascular;  Laterality: N/A;  . TEE WITHOUT CARDIOVERSION N/A 06/22/2019   Procedure: TRANSESOPHAGEAL ECHOCARDIOGRAM (TEE);  Surgeon: Geralynn Rile, MD;  Location: [place];  Service: Cardiovascular;  Laterality: N/A;  . TEE WITHOUT CARDIOVERSION N/A 08/10/2019   Procedure: TRANSESOPHAGEAL ECHOCARDIOGRAM (TEE);  Surgeon: [name], MD;  Location: [place] [facility];  Service: Open Heart Surgery;  Laterality: N/A;  . TRANSCATHETER AORTIC VALVE REPLACEMENT, TRANSFEMORAL N/A 08/10/2019   Procedure: TRANSCATHETER AORTIC VALVE REPLACEMENT, TRANSFEMORAL;  Surgeon: [name], MD;  Location: [place] [facility];  Service: Open Heart Surgery;  Laterality: N/A;    Current Medications: Outpatient Medications Prior to Visit  Medication Sig Dispense Refill  . acetaminophen (TYLENOL) 325 MG tablet Take 325-650 mg by mouth every 6 (six) hours as needed for mild pain or headache.    Marland Kitchen aspirin EC 81 MG tablet Take 1 tablet (81 mg total) by mouth daily. Swallow whole. 90 tablet 3  . clopidogrel (PLAVIX) 75 MG tablet Take 1 tablet (75 mg total) by mouth daily with breakfast. 90 tablet 1  . furosemide (LASIX) 40 MG tablet Take 1 tablet (40 mg total) by mouth daily. 30 tablet 6  . levothyroxine (SYNTHROID) 88 MCG tablet Take 88 mcg by mouth daily before breakfast.     . potassium chloride (KLOR-CON) 10 MEQ tablet Take 20 mEq by mouth daily.    . traMADol (ULTRAM) 50 MG tablet Take 50 mg by mouth 3 (three) times daily as needed for severe pain.     Marland Kitchen amoxicillin (AMOXIL) 500 MG tablet Take 4 capsules (2,000 mg) one hour prior to all dental visits. 8 tablet 11  . fluticasone (FLONASE) 50 MCG/ACT nasal spray Place 1 spray into  both nostrils daily as needed for allergies. (Patient not taking: Reported on 09/22/2019)     No facility-administered medications prior to visit.     Allergies:   Patient has no known allergies.   Social History   Socioeconomic History  . Marital status: Married    Spouse name: Not on file  . Number of children: Not on file  . Years of education: Not on file  . Highest education level: Not on file  Occupational History  . Not on file  Tobacco Use  . Smoking status: Never Smoker  . Smokeless tobacco: Never Used  Vaping Use  . Vaping Use: Never used  Substance and Sexual Activity  . Alcohol use: Never  . Drug use: Never  . Sexual activity: Not on file  Other Topics Concern  . Not on file  Social History Narrative  . Not on file   Social Determinants of Health   Financial Resource Strain:   . Difficulty of Paying Living Expenses:  Not on file  Food Insecurity:   . Worried About Charity fundraiser in the Last Year: Not on file  . Ran Out of Food in the Last Year: Not on file  Transportation Needs:   . Lack of Transportation (Medical): Not on file  . Lack of Transportation (Non-Medical): Not on file  Physical Activity:   . Days of Exercise per Week: Not on file  . Minutes of Exercise per Session: Not on file  Stress:   . Feeling of Stress : Not on file  Social Connections:   . Frequency of Communication with Friends and Family: Not on file  . Frequency of Social Gatherings with Friends and Family: Not on file  . Attends Religious Services: Not on file  . Active Member of Clubs or Organizations: Not on file  . Attends Archivist Meetings: Not on file  . Marital Status: Not on file     Family History:  The patient's family history is not on file.     ROS:   Please see the history of present illness.    ROS All other systems reviewed and are negative.   PHYSICAL EXAM:   VS:  BP 124/70   Pulse 81   Ht 5\' 1"  (1.549 m)   Wt 121 lb (54.9 kg)   BMI 22.86  kg/m    GEN: Well nourished, well developed, in no acute distress HEENT: normal Neck: no JVD or masses Cardiac: RRR; 3/6 SEM. No rubs, or gallops. 2+ bilateral LE edema with erythema and blistering Respiratory:  clear to auscultation bilaterally, normal work of breathing GI: soft, nontender, nondistended, + BS MS: no deformity or atrophy Skin: warm and dry, no rash. Right hand swollen, tender and erythmatous Neuro:  Alert and Oriented x 3, Strength and sensation are intact Psych: euthymic mood, full affect   Wt Readings from Last 3 Encounters:  09/22/19 121 lb (54.9 kg)  08/19/19 120 lb (54.4 kg)  08/11/19 120 lb 5.9 oz (54.6 kg)      Studies/Labs Reviewed:   EKG:  EKG is ordered today.  ECG shows sinus with PSC  Recent Labs: 03/29/2019: NT-Pro BNP 737 08/05/2019: ALT 17; B Natriuretic Peptide 289.4 08/11/2019: Hemoglobin 10.7; Magnesium 1.7; Platelets 131 08/19/2019: BUN 11; Creatinine, Ser 0.68; Potassium 4.2; Sodium 137   Lipid Panel No results found for: CHOL, TRIG, HDL, CHOLHDL, VLDL, LDLCALC, LDLDIRECT  Additional studies/ records that were reviewed today include:  TAVR OPERATIVE NOTE  Date of Procedure:08/10/2019  Preoperative Diagnosis:Severe Aortic Stenosis   Postoperative Diagnosis:Same   Procedure:   Transcatheter Aortic Valve Replacement - PercutaneousRightTransfemoral Approach Medtronic CoreValve Evolut Pro (size 50mm, serial # [id number])  Co-Surgeons:Michael Burt Knack, MD andClarence H. Roxy Manns, MD   Anesthesiologist:E. Annye Asa, MD  Echocardiographer:Peter Johnsie Cancel, MD  Pre-operative Echo Findings: ? Severe aortic stenosis ? Mildleft ventricular systolic dysfunction ? Atrial septal defect ? Moderate-severe right ventricular dysfunction ? Moderate-severe tricuspid regurgitation  Post-operative Echo  Findings: ? Trivialparavalvular leak ? Unchangedleft ventricular systolic function  _____________   Echo 08/11/19: IMPRESSIONS  1. Abnormal septal motion inferior basal hypokinesis . Left ventricular  ejection fraction, by estimation, is 50 to 55%. The left ventricle has low  normal function.  2. Limited echo previous echo with moderate RVE and hypokinesis . Right  ventricular systolic function was not well visualized. The right  ventricular size is not well visualized.  3. Not assessed on this limited echo Known large secundum ASA.  4. Trivial mitral valve regurgitation.  5. Tricuspid valve regurgitation is severe.  6. Post TAVR with 26 mm Medtronic Evolut Pro valve In good position with  no significant PvL Peak velocity 1.5 m/sec mean gradient 6 peak 9.4 mmHg  with AVA 1.8 cm2 DVI 0.76. The aortic valve has been repaired/replaced.   _____________________  Echo 09/22/19 IMPRESSIONS  1. Left ventricular ejection fraction, by estimation, is 55 to 60%. The left ventricle has normal function. The left ventricle has no regional wall motion abnormalities. Left ventricular diastolic parameters were normal.  2. Right ventricular systolic function is normal. The right ventricular size is normal. There is normal pulmonary artery systolic pressure.  3. Left atrial size was mildly dilated.  4. Known large secondum ASD not well seen on current study.  5. Right atrial size was moderately dilated.  6. The mitral valve is normal in structure. Trivial mitral valve regurgitation. No evidence of mitral stenosis.  7. Tricuspid valve regurgitation is severe.  8. Post TAVR July 2021 with 26 mm Medtronic Evolut Pro valve Trivial PVL which was also present on post implant echo in July Stable low gardients . The aortic valve has been repaired/replaced. Aortic valve regurgitation is not visualized. No aortic  stenosis is present. There is a 26 mm CoreValve-Evolut Pro prosthetic (TAVR) valve present  in the aortic position. Procedure Date: 08/10/19.  9. The inferior vena cava is normal in size with greater than 50% respiratory variability, suggesting right atrial pressure of 3 mmHg.  Comparison(s): 08/11/19 EF 50-55%. AV 40mmHg peak PG, 1mmHg mean PG.  ASSESSMENT & PLAN:   Severe AS s/p TAVR: echo today showed EF 55%, normally functioning TAVR with a mean gradient of 3.8 mmHg and trivial PVL. There is severe TR. She has NYHA class I symptoms and continues to be limited by her LE edema and pain. SBE prophylaxis discussed; I have RX'd amoxicillin. Continue aspirin and plavix. She will continue plavix x 64months from ASD closure (can stop 03/2020).  Chronic diastolic CHF: appears euvolemic aside from chronic LE edema which she thinks may be mildly improved since TAVR.  Continue lasix 40mg  daily.  Chronic LE edema: continue follow up with the wound care center. We are hopeful that this will improve after ASD closure. Continue tramadol for pain.   ASD with right heart dilation: will plan for staged percutaneous ASD closure on 9/3. She has been given all the information for her closure today.   Specific risks of transcatheter ASD closure are reviewed with the patient. These risks include bleeding, infection, device embolization, stroke, cardiac perforation, tamponade, arrhythmia, MI, and late device erosion. She understands these serious risks occur at low incidence of < 1%.  The patient understands the need to take dual antiplatelet therapy with aspirin and clopidogrel together for a minimum period of 6 months following transcatheter ASD closure.  The patient provides full informed consent for the procedure  Severe TR: severe by echo today. Continue medical therapy.    Medication Adjustments/Labs and Tests Ordered: Current medicines are reviewed at length with the patient today.  Concerns regarding medicines are outlined above.  Medication changes, Labs and Tests ordered today are listed in the  Patient Instructions below. Patient Instructions  COVID SCREENING INFORMATION (9/2): You are scheduled for your drive-thru COVID screening on 09/23/19 between 9AM and 11AM. Pre-Procedural COVID-19 Testing Site [address] [place], [place] [address] You will need to go home after your screening and quarantine until your procedure.   ASD CLOSURE INSTRUCTIONS (9/3): You are scheduled for an  ASD CLOSURE on Friday, September 3 with Dr. [name].  1. Please arrive at the [facility] (Main Entrance A) at [facility]: [address] [place], [place] [address] at 5:30 AM (This time is two hours before your procedure to ensure your preparation). Free valet parking service is available. You are allowed ONE visitor in the waiting room during your procedure.  Special note: Every effort is made to have your procedure done on time. Please understand that emergencies sometimes delay scheduled procedures.  2. Diet: Do not eat solid foods after midnight.  The patient may have clear liquids until 5am upon the day of the procedure.  3. Labs: TODAY! BMET, CBC  4. Medication instructions in preparation for your procedure:  1) HOLD LASIX the morning of your procedure  2) MAKE SURE TO TAKE YOUR ASPIRIN the morning of your procedure  3) MAKE SURE TO TAKE YOUR PLAVIX the morning of your procedure  4) You may take your other meds as directed with sips of water   5. Plan for one night stay--bring personal belongings. 6. Bring a current list of your medications and current insurance cards. 7. You MUST have a responsible person to drive you home. 8. Someone MUST be with you the first 24 hours after you arrive home or your discharge will be delayed. 9. Please wear clothes that are easy to get on and off and wear slip-on shoes.  FOLLOW-UP VISIT: You are scheduled for a follow-up with Dr. Harriet Masson on Thursday, October 28, 2019 at 1:20PM.     Signed, Angelena Form, PA-C  09/22/2019 [address], [place], [place]  [address] Phone: [phone]; Fax: [phone]

## 2019-09-23 ENCOUNTER — Other Ambulatory Visit (HOSPITAL_COMMUNITY)
Admission: RE | Admit: 2019-09-23 | Discharge: 2019-09-23 | Disposition: A | Discharge status: Home / Self Care | Payer: PPO | Source: Ambulatory Visit | Attending: Cardiovascular Disease | Admitting: Cardiovascular Disease

## 2019-09-23 DIAGNOSIS — Z01812 Encounter for preprocedural laboratory examination: Secondary | ICD-10-CM | POA: Diagnosis not present

## 2019-09-23 DIAGNOSIS — Z20822 Contact with and (suspected) exposure to covid-19: Secondary | ICD-10-CM | POA: Diagnosis not present

## 2019-09-23 LAB — CBC WITH DIFFERENTIAL/PLATELET
Basophils Absolute: 0 10*3/uL (ref 0.0–0.2)
Basos: 0 %
EOS (ABSOLUTE): 0.1 10*3/uL (ref 0.0–0.4)
Eos: 2 %
Hematocrit: 38.1 % (ref 34.0–46.6)
Hemoglobin: 12.4 g/dL (ref 11.1–15.9)
Immature Grans (Abs): 0 10*3/uL (ref 0.0–0.1)
Immature Granulocytes: 0 %
Lymphocytes Absolute: 0.9 10*3/uL (ref 0.7–3.1)
Lymphs: 20 %
MCH: 30 pg (ref 26.6–33.0)
MCHC: 32.5 g/dL (ref 31.5–35.7)
MCV: 92 fL (ref 79–97)
Monocytes Absolute: 0.5 10*3/uL (ref 0.1–0.9)
Monocytes: 11 %
Neutrophils Absolute: 3.1 10*3/uL (ref 1.4–7.0)
Neutrophils: 67 %
Platelets: 133 10*3/uL — ABNORMAL LOW (ref 150–450)
RBC: 4.13 x10E6/uL (ref 3.77–5.28)
RDW: 14.2 % (ref 11.7–15.4)
WBC: 4.6 10*3/uL (ref 3.4–10.8)

## 2019-09-23 LAB — BASIC METABOLIC PANEL
BUN/Creatinine Ratio: 15 (ref 12–28)
BUN: 12 mg/dL (ref 8–27)
CO2: 26 mmol/L (ref 20–29)
Calcium: 9.5 mg/dL (ref 8.7–10.3)
Chloride: 104 mmol/L (ref 96–106)
Creatinine, Ser: 0.79 mg/dL (ref 0.57–1.00)
GFR calc Af Amer: 85 mL/min/{1.73_m2} (ref >59)
GFR calc non Af Amer: 73 mL/min/{1.73_m2} (ref >59)
Glucose: 85 mg/dL (ref 65–99)
Potassium: 4.2 mmol/L (ref 3.5–5.2)
Sodium: 142 mmol/L (ref 134–144)

## 2019-09-23 LAB — SARS CORONAVIRUS 2 (TAT 6-24 HRS): SARS Coronavirus 2: NEGATIVE

## 2019-09-24 ENCOUNTER — Other Ambulatory Visit: Payer: Self-pay

## 2019-09-24 ENCOUNTER — Ambulatory Visit (HOSPITAL_BASED_OUTPATIENT_CLINIC_OR_DEPARTMENT_OTHER): Payer: PPO

## 2019-09-24 ENCOUNTER — Ambulatory Visit (HOSPITAL_COMMUNITY)
Admission: RE | Disposition: A | Discharge status: Home / Self Care | Payer: PPO | Source: Home / Self Care | Attending: Cardiovascular Disease

## 2019-09-24 ENCOUNTER — Ambulatory Visit (HOSPITAL_COMMUNITY)
Admission: RE | Admit: 2019-09-24 | Discharge: 2019-09-24 | Disposition: A | Discharge status: Home / Self Care | Payer: PPO | Attending: Cardiovascular Disease | Admitting: Cardiovascular Disease

## 2019-09-24 DIAGNOSIS — M1990 Unspecified osteoarthritis, unspecified site: Secondary | ICD-10-CM | POA: Insufficient documentation

## 2019-09-24 DIAGNOSIS — I272 Pulmonary hypertension, unspecified: Secondary | ICD-10-CM | POA: Diagnosis not present

## 2019-09-24 DIAGNOSIS — Q211 Atrial septal defect, unspecified: Secondary | ICD-10-CM

## 2019-09-24 DIAGNOSIS — Z853 Personal history of malignant neoplasm of breast: Secondary | ICD-10-CM | POA: Insufficient documentation

## 2019-09-24 DIAGNOSIS — I5032 Chronic diastolic (congestive) heart failure: Secondary | ICD-10-CM | POA: Diagnosis not present

## 2019-09-24 DIAGNOSIS — I083 Combined rheumatic disorders of mitral, aortic and tricuspid valves: Secondary | ICD-10-CM | POA: Diagnosis not present

## 2019-09-24 DIAGNOSIS — Z9010 Acquired absence of unspecified breast and nipple: Secondary | ICD-10-CM | POA: Insufficient documentation

## 2019-09-24 DIAGNOSIS — Z953 Presence of xenogenic heart valve: Secondary | ICD-10-CM | POA: Insufficient documentation

## 2019-09-24 DIAGNOSIS — E039 Hypothyroidism, unspecified: Secondary | ICD-10-CM | POA: Diagnosis not present

## 2019-09-24 DIAGNOSIS — I351 Nonrheumatic aortic (valve) insufficiency: Secondary | ICD-10-CM | POA: Diagnosis not present

## 2019-09-24 DIAGNOSIS — Z7902 Long term (current) use of antithrombotics/antiplatelets: Secondary | ICD-10-CM | POA: Diagnosis not present

## 2019-09-24 DIAGNOSIS — Z79899 Other long term (current) drug therapy: Secondary | ICD-10-CM | POA: Insufficient documentation

## 2019-09-24 DIAGNOSIS — I361 Nonrheumatic tricuspid (valve) insufficiency: Secondary | ICD-10-CM | POA: Diagnosis not present

## 2019-09-24 DIAGNOSIS — Z7982 Long term (current) use of aspirin: Secondary | ICD-10-CM | POA: Insufficient documentation

## 2019-09-24 DIAGNOSIS — Z90710 Acquired absence of both cervix and uterus: Secondary | ICD-10-CM | POA: Insufficient documentation

## 2019-09-24 DIAGNOSIS — Z79890 Hormone replacement therapy (postmenopausal): Secondary | ICD-10-CM | POA: Insufficient documentation

## 2019-09-24 DIAGNOSIS — I340 Nonrheumatic mitral (valve) insufficiency: Secondary | ICD-10-CM | POA: Diagnosis not present

## 2019-09-24 HISTORY — PX: ATRIAL SEPTAL DEFECT(ASD) CLOSURE: CATH118299

## 2019-09-24 LAB — ECHOCARDIOGRAM LIMITED
AR max vel: 1.76 cm2
AV Area VTI: 1.75 cm2
AV Area mean vel: 2 cm2
AV Mean grad: 3 mmHg
AV Peak grad: 6.9 mmHg
Ao pk vel: 1.31 m/s
Area-P 1/2: 4.83 cm2
Height: 61 in
Weight: 1920 oz

## 2019-09-24 LAB — POCT ACTIVATED CLOTTING TIME
Activated Clotting Time: 257 seconds
Activated Clotting Time: 180 seconds

## 2019-09-24 SURGERY — ATRIAL SEPTAL DEFECT (ASD) CLOSURE
Anesthesia: LOCAL

## 2019-09-24 MED ORDER — HEPARIN (PORCINE) IN NACL 1000-0.9 UT/500ML-% IV SOLN
INTRAVENOUS | Status: DC | PRN
  Administered 2019-09-24: 500 mL

## 2019-09-24 MED ORDER — SODIUM CHLORIDE 0.9% FLUSH: 3.0000 mL | Freq: Two times a day (BID) | INTRAVENOUS | Status: DC

## 2019-09-24 MED ORDER — ASPIRIN 81 MG PO CHEW
81.0000 mg | CHEWABLE_TABLET | ORAL | Status: AC
  Administered 2019-09-24: 81 mg via ORAL

## 2019-09-24 MED ORDER — IOHEXOL 350 MG/ML SOLN
INTRAVENOUS | Status: DC | PRN
  Administered 2019-09-24: 15 mL

## 2019-09-24 MED ORDER — MIDAZOLAM HCL 2 MG/2ML IJ SOLN
INTRAMUSCULAR | Status: AC
  Filled 2019-09-24: qty 2

## 2019-09-24 MED ORDER — SODIUM CHLORIDE 0.9 % WEIGHT BASED INFUSION
3.0000 mL/kg/h | INTRAVENOUS | Status: AC
  Administered 2019-09-24: 3 mL/kg/h via INTRAVENOUS

## 2019-09-24 MED ORDER — HEPARIN SODIUM (PORCINE) 1000 UNIT/ML IJ SOLN
INTRAMUSCULAR | Status: AC
  Filled 2019-09-24: qty 1

## 2019-09-24 MED ORDER — PROMETHAZINE HCL 25 MG/ML IJ SOLN: 6.2500 mg | INTRAMUSCULAR | Status: DC | PRN

## 2019-09-24 MED ORDER — MIDAZOLAM HCL 2 MG/2ML IJ SOLN
INTRAMUSCULAR | Status: DC | PRN
  Administered 2019-09-24: 1 mg via INTRAVENOUS

## 2019-09-24 MED ORDER — LABETALOL HCL 5 MG/ML IV SOLN: 10.0000 mg | INTRAVENOUS | Status: DC | PRN

## 2019-09-24 MED ORDER — HEPARIN (PORCINE) IN NACL 1000-0.9 UT/500ML-% IV SOLN
INTRAVENOUS | Status: AC
  Filled 2019-09-24: qty 500

## 2019-09-24 MED ORDER — ASPIRIN 81 MG PO CHEW
CHEWABLE_TABLET | ORAL | Status: AC
  Filled 2019-09-24: qty 1

## 2019-09-24 MED ORDER — FENTANYL CITRATE (PF) 100 MCG/2ML IJ SOLN
INTRAMUSCULAR | Status: DC | PRN
  Administered 2019-09-24: 25 ug via INTRAVENOUS

## 2019-09-24 MED ORDER — HYDRALAZINE HCL 20 MG/ML IJ SOLN: 10.0000 mg | INTRAMUSCULAR | Status: DC | PRN

## 2019-09-24 MED ORDER — CEFAZOLIN SODIUM-DEXTROSE 2-4 GM/100ML-% IV SOLN
INTRAVENOUS | Status: AC
  Filled 2019-09-24: qty 100

## 2019-09-24 MED ORDER — SODIUM CHLORIDE 0.9% FLUSH: 3.0000 mL | INTRAVENOUS | Status: DC | PRN

## 2019-09-24 MED ORDER — HEPARIN (PORCINE) IN NACL 1000-0.9 UT/500ML-% IV SOLN
INTRAVENOUS | Status: AC
  Filled 2019-09-24: qty 1000

## 2019-09-24 MED ORDER — SODIUM CHLORIDE 0.9 % IV SOLN: 250.0000 mL | INTRAVENOUS | Status: DC | PRN

## 2019-09-24 MED ORDER — CLOPIDOGREL BISULFATE 75 MG PO TABS: 75.0000 mg | ORAL_TABLET | ORAL | Status: AC

## 2019-09-24 MED ORDER — ACETAMINOPHEN 325 MG PO TABS: 650.0000 mg | ORAL_TABLET | ORAL | Status: DC | PRN

## 2019-09-24 MED ORDER — HEPARIN SODIUM (PORCINE) 1000 UNIT/ML IJ SOLN
INTRAMUSCULAR | Status: DC | PRN
  Administered 2019-09-24: 4000 [IU] via INTRAVENOUS

## 2019-09-24 MED ORDER — CEFAZOLIN SODIUM-DEXTROSE 2-3 GM-%(50ML) IV SOLR
INTRAVENOUS | Status: AC | PRN
  Administered 2019-09-24: 2 g via INTRAVENOUS

## 2019-09-24 MED ORDER — LIDOCAINE HCL (PF) 1 % IJ SOLN
INTRAMUSCULAR | Status: AC
  Filled 2019-09-24: qty 30

## 2019-09-24 MED ORDER — ONDANSETRON HCL 4 MG/2ML IJ SOLN: 4.0000 mg | Freq: Four times a day (QID) | INTRAMUSCULAR | Status: DC | PRN

## 2019-09-24 MED ORDER — FENTANYL CITRATE (PF) 100 MCG/2ML IJ SOLN
INTRAMUSCULAR | Status: AC
  Filled 2019-09-24: qty 2

## 2019-09-24 MED ORDER — CEFAZOLIN SODIUM-DEXTROSE 2-4 GM/100ML-% IV SOLN: 2.0000 g | INTRAVENOUS | Status: DC

## 2019-09-24 MED ORDER — LIDOCAINE HCL (PF) 1 % IJ SOLN
INTRAMUSCULAR | Status: DC | PRN
  Administered 2019-09-24: 10 mL

## 2019-09-24 MED ORDER — SODIUM CHLORIDE 0.9 % WEIGHT BASED INFUSION: 1.0000 mL/kg/h | INTRAVENOUS | Status: DC

## 2019-09-24 SURGICAL SUPPLY — 14 items
BALLN SIZING AMPLATZER 24 (BALLOONS) ×2
BALLOON SIZING AMPLATZER 24 (BALLOONS) ×1 IMPLANT
CATH ACUNAV 8FR 90CM (CATHETERS) ×2 IMPLANT
CATH INFINITI 6F MPA2 100CM (CATHETERS) ×2 IMPLANT
COVER SWIFTLINK CONNECTOR (BAG) ×2 IMPLANT
GUIDEWIRE AMPLATZER 1.5JX260 (WIRE) ×2 IMPLANT
OCCLUDER AMPLATZER SEPTAL 14MM (Prosthesis & Implant Heart) ×2 IMPLANT
PACK CARDIAC CATHETERIZATION (CUSTOM PROCEDURE TRAY) ×2 IMPLANT
SHEATH INTROD W/O MIN 9FR 25CM (SHEATH) ×2 IMPLANT
SHEATH PINNACLE 8F 10CM (SHEATH) ×2 IMPLANT
SHEATH PROBE COVER 6X72 (BAG) ×2 IMPLANT
SYS DELIVER AMP TREVISIO 8FR (SHEATH) ×2
SYSTEM DELIVER AMP TREVIS 8FR (SHEATH) ×1 IMPLANT
WIRE EMERALD 3MM-J .035X150CM (WIRE) ×2 IMPLANT

## 2019-09-24 NOTE — Discharge Instructions (Signed)
Femoral Site Care This sheet gives you information about how to care for yourself after your procedure. Your health care provider may also give you more specific instructions. If you have problems or questions, contact your health care provider. What can I expect after the procedure? After the procedure, it is common to have:  Bruising that usually fades within 1-2 weeks.  Tenderness at the site. Follow these instructions at home: Wound care  Follow instructions from your health care provider about how to take care of your insertion site. Make sure you: ? Wash your hands with soap and water before you change your bandage (dressing). If soap and water are not available, use hand sanitizer. ? Change your dressing as told by your health care provider. ? Leave stitches (sutures), skin glue, or adhesive strips in place. These skin closures may need to stay in place for 2 weeks or longer. If adhesive strip edges start to loosen and curl up, you may trim the loose edges. Do not remove adhesive strips completely unless your health care provider tells you to do that.  Do not take baths, swim, or use a hot tub until your health care provider approves.  You may shower 24-48 hours after the procedure or as told by your health care provider. ? Gently wash the site with plain soap and water. ? Pat the area dry with a clean towel. ? Do not rub the site. This may cause bleeding.  Do not apply powder or lotion to the site. Keep the site clean and dry.  Check your femoral site every day for signs of infection. Check for: ? Redness, swelling, or pain. ? Fluid or blood. ? Warmth. ? Pus or a bad smell. Activity  For the first 2-3 days after your procedure, or as long as directed: ? Avoid climbing stairs as much as possible. ? Do not squat.  Do not lift anything that is heavier than 10 lb (4.5 kg), or the limit that you are told, until your health care provider says that it is safe.  Rest as  directed. ? Avoid sitting for a long time without moving. Get up to take short walks every 1-2 hours.  Do not drive for 24 hours if you were given a medicine to help you relax (sedative). General instructions  Take over-the-counter and prescription medicines only as told by your health care provider.  Keep all follow-up visits as told by your health care provider. This is important. Contact a health care provider if you have:  A fever or chills.  You have redness, swelling, or pain around your insertion site. Get help right away if:  The catheter insertion area swells very fast.  You pass out.  You suddenly start to sweat or your skin gets clammy.  The catheter insertion area is bleeding, and the bleeding does not stop when you hold steady pressure on the area.  The area near or just beyond the catheter insertion site becomes pale, cool, tingly, or numb. These symptoms may represent a serious problem that is an emergency. Do not wait to see if the symptoms will go away. Get medical help right away. Call your local emergency services (911 in the [place] Do not drive yourself to the hospital. Summary  After the procedure, it is common to have bruising that usually fades within 1-2 weeks.  Check your femoral site every day for signs of infection.  Do not lift anything that is heavier than 10 lb (4.5 kg), or the   limit that you are told, until your health care provider says that it is safe. This information is not intended to replace advice given to you by your health care provider. Make sure you discuss any questions you have with your health care provider. Document Revised: 01/20/2017 Document Reviewed: 01/20/2017 Elsevier Patient Education  2020 [employer]  

## 2019-09-24 NOTE — Progress Notes (Signed)
Site area: Right groin a 9 and 8 french venous sheath was removed  Site Prior to Removal:  Level 0  Pressure Applied For 30  MINUTES    Bedrest Beginning at 1025am  Manual:   Yes.    Patient Status During Pull:  stable  Post Pull Groin Site:  Level 0  Post Pull Instructions Given:  Yes.    Post Pull Pulses Present:  Yes.    Dressing Applied:  Yes.    Comments:

## 2019-09-24 NOTE — Progress Notes (Signed)
  Echocardiogram 2D Echocardiogram limited S/P ASD closure has been performed.  Denise Bailey M 09/24/2019, 10:17 AM

## 2019-09-24 NOTE — Interval H&P Note (Signed)
History and Physical Interval Note:  09/24/2019 6:07 AM  Denise Bailey  has presented today for surgery, with the diagnosis of asd.  The various methods of treatment have been discussed with the patient and family. After consideration of risks, benefits and other options for treatment, the patient has consented to  Procedure(s): ATRIAL SEPTAL DEFECT (ASD) CLOSURE (N/A) as a surgical intervention.  The patient's history has been reviewed, patient examined, no change in status, stable for surgery.  I have reviewed the patient's chart and labs.  Questions were answered to the patient's satisfaction.     [name]

## 2019-09-27 MED FILL — Heparin Sod (Porcine)-NaCl IV Soln 1000 Unit/500ML-0.9%: INTRAVENOUS | Qty: 1000 | Status: AC

## 2019-09-28 ENCOUNTER — Encounter (HOSPITAL_COMMUNITY): Payer: Self-pay | Admitting: Cardiovascular Disease

## 2019-10-28 ENCOUNTER — Ambulatory Visit: Payer: PPO | Admitting: Cardiology

## 2019-10-28 ENCOUNTER — Other Ambulatory Visit: Payer: Self-pay

## 2019-10-28 VITALS — BP 116/60 | HR 72 | Ht 61.0 in | Wt 118.4 lb

## 2019-10-28 DIAGNOSIS — Z853 Personal history of malignant neoplasm of breast: Secondary | ICD-10-CM | POA: Insufficient documentation

## 2019-10-28 DIAGNOSIS — T8859XA Other complications of anesthesia, initial encounter: Secondary | ICD-10-CM | POA: Insufficient documentation

## 2019-10-28 DIAGNOSIS — Q211 Atrial septal defect, unspecified: Secondary | ICD-10-CM | POA: Insufficient documentation

## 2019-10-28 DIAGNOSIS — M1990 Unspecified osteoarthritis, unspecified site: Secondary | ICD-10-CM | POA: Insufficient documentation

## 2019-10-28 DIAGNOSIS — I5032 Chronic diastolic (congestive) heart failure: Secondary | ICD-10-CM

## 2019-10-28 DIAGNOSIS — Q2111 Secundum atrial septal defect: Secondary | ICD-10-CM

## 2019-10-28 DIAGNOSIS — R600 Localized edema: Secondary | ICD-10-CM

## 2019-10-28 DIAGNOSIS — Z952 Presence of prosthetic heart valve: Secondary | ICD-10-CM

## 2019-10-28 DIAGNOSIS — C50919 Malignant neoplasm of unspecified site of unspecified female breast: Secondary | ICD-10-CM | POA: Insufficient documentation

## 2019-10-28 HISTORY — DX: Localized edema: R60.0

## 2019-10-28 HISTORY — DX: Chronic diastolic (congestive) heart failure: I50.32

## 2019-10-28 HISTORY — DX: Secundum atrial septal defect: Q21.11

## 2019-10-28 NOTE — Patient Instructions (Signed)

## 2019-10-28 NOTE — Progress Notes (Signed)
Cardiology Office Note:    Date:  10/28/2019   ID:  Denise Bailey, DOB 21-Jul-1945, MRN [id number]  PCP:  Penelope Coop, FNP  Cardiologist:  Berniece Salines, DO  Electrophysiologist:  None   Referring MD: Penelope Coop, FNP  " I am doing fine"  History of Present Illness:    Denise Bailey is a 74 y.o. female with a hx of aortic valve stenosis status post TAVR on August 10, 2019, ASD with repair on September 24, 2019 with a 14 mm Amplatzer, severe tricuspid regurgitation she is here today for follow-up visit.  The patient tells me that she has been doing well since both procedures.  The only problem she had is she still is experiencing leg pain when she walks.  The leg edema has been improving significantly since her procedure.   Past Medical History:  Diagnosis Date  . Acute on chronic diastolic heart failure ([place]) 08/11/2019  . Arthritis   . ASD (atrial septal defect)   . Atrial septal defect   . Bilateral leg edema 03/15/2019  . Breast cancer ([place])   . Complication of anesthesia    patient reportd having difficult time waking up from anesthesia  . History of breast cancer    s/p mastectomy   . Hypothyroidism   . S/P TAVR (transcatheter aortic valve replacement) 08/10/2019   s/p TAVR with a 26 mm Medtronic Evolut Pro + via the TF approach by Dr. Burt Knack and Roxy Manns   . Severe aortic stenosis 03/15/2019  . Venous insufficiency 04/12/2019    Past Surgical History:  Procedure Laterality Date  . ABDOMINAL HYSTERECTOMY    . ATRIAL SEPTAL DEFECT(ASD) CLOSURE N/A 09/24/2019   Procedure: ATRIAL SEPTAL DEFECT (ASD) CLOSURE;  Surgeon: [name], MD;  Location: [facility];  Service: Cardiovascular;  Laterality: N/A;  . MASTECTOMY    . RIGHT/LEFT HEART CATH AND CORONARY ANGIOGRAPHY N/A 06/09/2019   Procedure: RIGHT/LEFT HEART CATH AND CORONARY ANGIOGRAPHY;  Surgeon: [name], MD;  Location: [facility];  Service: Cardiovascular;  Laterality: N/A;  . TEE  WITHOUT CARDIOVERSION N/A 06/22/2019   Procedure: TRANSESOPHAGEAL ECHOCARDIOGRAM (TEE);  Surgeon: Geralynn Rile, MD;  Location: [facility];  Service: Cardiovascular;  Laterality: N/A;  . TEE WITHOUT CARDIOVERSION N/A 08/10/2019   Procedure: TRANSESOPHAGEAL ECHOCARDIOGRAM (TEE);  Surgeon: [name], MD;  Location: [facility];  Service: Open Heart Surgery;  Laterality: N/A;  . TRANSCATHETER AORTIC VALVE REPLACEMENT, TRANSFEMORAL N/A 08/10/2019   Procedure: TRANSCATHETER AORTIC VALVE REPLACEMENT, TRANSFEMORAL;  Surgeon: [name], MD;  Location: [facility];  Service: Open Heart Surgery;  Laterality: N/A;    Current Medications: Current Meds  Medication Sig  . acetaminophen (TYLENOL) 325 MG tablet Take 325-650 mg by mouth every 6 (six) hours as needed for mild pain or headache.  Marland Kitchen amoxicillin (AMOXIL) 500 MG tablet Take 4 capsules (2,000 mg) one hour prior to all dental visits.  Marland Kitchen aspirin EC 81 MG tablet Take 1 tablet (81 mg total) by mouth daily. Swallow whole.  . clopidogrel (PLAVIX) 75 MG tablet Take 1 tablet (75 mg total) by mouth daily with breakfast.  . fluticasone (FLONASE) 50 MCG/ACT nasal spray Place 1 spray into both nostrils daily as needed for allergies or rhinitis.  . furosemide (LASIX) 40 MG tablet Take 1 tablet (40 mg total) by mouth daily.  Marland Kitchen levothyroxine (SYNTHROID) 88 MCG tablet Take 88 mcg by mouth daily before breakfast.   . potassium chloride (KLOR-CON)  10 MEQ tablet Take 20 mEq by mouth daily.  . traMADol (ULTRAM) 50 MG tablet Take 50 mg by mouth 3 (three) times daily as needed for severe pain.      Allergies:   Patient has no known allergies.   Social History   Socioeconomic History  . Marital status: Married    Spouse name: Not on file  . Number of children: Not on file  . Years of education: Not on file  . Highest education level: Not on file  Occupational History  . Not on file  Tobacco Use  . Smoking status: Never Smoker  .  Smokeless tobacco: Never Used  Vaping Use  . Vaping Use: Never used  Substance and Sexual Activity  . Alcohol use: Never  . Drug use: Never  . Sexual activity: Not on file  Other Topics Concern  . Not on file  Social History Narrative  . Not on file   Social Determinants of Health   Financial Resource Strain:   . Difficulty of Paying Living Expenses: Not on file  Food Insecurity:   . Worried About Charity fundraiser in the Last Year: Not on file  . Ran Out of Food in the Last Year: Not on file  Transportation Needs:   . Lack of Transportation (Medical): Not on file  . Lack of Transportation (Non-Medical): Not on file  Physical Activity:   . Days of Exercise per Week: Not on file  . Minutes of Exercise per Session: Not on file  Stress:   . Feeling of Stress : Not on file  Social Connections:   . Frequency of Communication with Friends and Family: Not on file  . Frequency of Social Gatherings with Friends and Family: Not on file  . Attends Religious Services: Not on file  . Active Member of Clubs or Organizations: Not on file  . Attends Archivist Meetings: Not on file  . Marital Status: Not on file     Family History: The patient's family history includes Diabetes in her brother; Stroke in her mother.  ROS:   Review of Systems  Constitution: Negative for decreased appetite, fever and weight gain.  HENT: Negative for congestion, ear discharge, hoarse voice and sore throat.   Eyes: Negative for discharge, redness, vision loss in right eye and visual halos.  Cardiovascular: Negative for chest pain, dyspnea on exertion, leg swelling, orthopnea and palpitations.  Respiratory: Negative for cough, hemoptysis, shortness of breath and snoring.   Endocrine: Negative for heat intolerance and polyphagia.  Hematologic/Lymphatic: Negative for bleeding problem. Does not bruise/bleed easily.  Skin: Negative for flushing, nail changes, rash and suspicious lesions.    Musculoskeletal: Negative for arthritis, joint pain, muscle cramps, myalgias, neck pain and stiffness.  Gastrointestinal: Negative for abdominal pain, bowel incontinence, diarrhea and excessive appetite.  Genitourinary: Negative for decreased libido, genital sores and incomplete emptying.  Neurological: Negative for brief paralysis, focal weakness, headaches and loss of balance.  Psychiatric/Behavioral: Negative for altered mental status, depression and suicidal ideas.  Allergic/Immunologic: Negative for HIV exposure and persistent infections.    EKGs/Labs/Other Studies Reviewed:    The following studies were reviewed today:   EKG: None today  Recent Labs: 03/29/2019: NT-Pro BNP 737 08/05/2019: ALT 17; B Natriuretic Peptide 289.4 08/11/2019: Magnesium 1.7 09/22/2019: BUN 12; Creatinine, Ser 0.79; Hemoglobin 12.4; Platelets 133; Potassium 4.2; Sodium 142  Recent Lipid Panel No results found for: CHOL, TRIG, HDL, CHOLHDL, VLDL, LDLCALC, LDLDIRECT  Physical Exam:  VS:  BP 116/60   Pulse 72   Ht 5\' 1"  (1.549 m)   Wt 118 lb 6.4 oz (53.7 kg)   SpO2 96%   BMI 22.37 kg/m     Wt Readings from Last 3 Encounters:  10/28/19 118 lb 6.4 oz (53.7 kg)  09/24/19 120 lb (54.4 kg)  09/22/19 121 lb (54.9 kg)     GEN: Well nourished, well developed in no acute distress HEENT: Normal NECK: No JVD; No carotid bruits LYMPHATICS: No lymphadenopathy CARDIAC: S1S2 noted,RRR, no murmurs, rubs, gallops RESPIRATORY:  Clear to auscultation without rales, wheezing or rhonchi  ABDOMEN: Soft, non-tender, non-distended, +bowel sounds, no guarding. EXTREMITIES: +1 bilateral edema, No cyanosis, no clubbing MUSCULOSKELETAL:  No deformity  SKIN: Warm and dry NEUROLOGIC:  Alert and oriented x 3, non-focal PSYCHIATRIC:  Normal affect, good insight  ASSESSMENT:    1. Secundum ASD   2. S/P TAVR (transcatheter aortic valve replacement)   3. Chronic diastolic CHF (congestive heart failure) ([place])   4. Leg  edema    PLAN:    I reviewed her recent echocardiogram which was done in September 2021.  Her valve appears to be is well-seated.  She did get her ASD closure done on September 24, 2018 when she is recovering well from this.  I discussed with the patient she needs to be on her aspirin and Plavix which she will need to take up until March 2022.  I did advise the patient that moving forward she will need endocarditis prophylaxis.   He does still have some leg edema but she tells me this is improving ongoing continue patient her current Lasix dose as well as her potassium supplement.  The patient is in agreement with the above plan. The patient left the office in stable condition.  The patient will follow up in 3 months or sooner if needed.   Medication Adjustments/Labs and Tests Ordered: Current medicines are reviewed at length with the patient today.  Concerns regarding medicines are outlined above.  No orders of the defined types were placed in this encounter.  No orders of the defined types were placed in this encounter.   Patient Instructions  Medication Instructions:  Your physician recommends that you continue on your current medications as directed. Please refer to the Current Medication list given to you today.  *If you need a refill on your cardiac medications before your next appointment, please call your pharmacy*   Lab Work: None If you have labs (blood work) drawn today and your tests are completely normal, you will receive your results only by: Marland Kitchen MyChart Message (if you have MyChart) OR . A paper copy in the mail If you have any lab test that is abnormal or we need to change your treatment, we will call you to review the results.   Testing/Procedures: None   Follow-Up: At [facility], you and your health needs are our priority.  As part of our continuing mission to provide you with exceptional heart care, we have created designated Provider Care Teams.  These Care  Teams include your primary Cardiologist (physician) and Advanced Practice Providers (APPs -  Physician Assistants and Nurse Practitioners) who all work together to provide you with the care you need, when you need it.  We recommend signing up for the patient portal called "MyChart".  Sign up information is provided on this After Visit Summary.  MyChart is used to connect with patients for Virtual Visits (Telemedicine).  Patients are able to view  lab/test results, encounter notes, upcoming appointments, etc.  Non-urgent messages can be sent to your provider as well.   To learn more about what you can do with MyChart, go to [email or web address].    Your next appointment:   3 month(s)  The format for your next appointment:   In Person  Provider:   Berniece Salines, DO   Other Instructions      Adopting a Healthy Lifestyle.  Know what a healthy weight is for you (roughly BMI <25) and aim to maintain this   Aim for 7+ servings of fruits and vegetables daily   65-80+ fluid ounces of water or unsweet tea for healthy kidneys   Limit to max 1 drink of alcohol per day; avoid smoking/tobacco   Limit animal fats in diet for cholesterol and heart health - choose grass fed whenever available   Avoid highly processed foods, and foods high in saturated/trans fats   Aim for low stress - take time to unwind and care for your mental health   Aim for 150 min of moderate intensity exercise weekly for heart health, and weights twice weekly for bone health   Aim for 7-9 hours of sleep daily   When it comes to diets, agreement about the perfect plan isnt easy to find, even among the experts. Experts at the [employer] developed an idea known as the Healthy Eating Plate. Just imagine a plate divided into logical, healthy portions.   The emphasis is on diet quality:   Load up on vegetables and fruits - one-half of your plate: Aim for color and variety, and remember that potatoes  dont count.   Go for whole grains - one-quarter of your plate: Whole wheat, barley, wheat berries, quinoa, oats, brown rice, and foods made with them. If you want pasta, go with whole wheat pasta.   Protein power - one-quarter of your plate: Fish, chicken, beans, and nuts are all healthy, versatile protein sources. Limit red meat.   The diet, however, does go beyond the plate, offering a few other suggestions.   Use healthy plant oils, such as olive, canola, soy, corn, sunflower and peanut. Check the labels, and avoid partially hydrogenated oil, which have unhealthy trans fats.   If youre thirsty, drink water. Coffee and tea are good in moderation, but skip sugary drinks and limit milk and dairy products to one or two daily servings.   The type of carbohydrate in the diet is more important than the amount. Some sources of carbohydrates, such as vegetables, fruits, whole grains, and beans-are healthier than others.   Finally, stay active  Signed, Berniece Salines, DO  10/28/2019 2:20 PM    [place]

## 2019-11-08 ENCOUNTER — Other Ambulatory Visit: Payer: Self-pay | Admitting: Physician Assistant

## 2019-11-17 DIAGNOSIS — Z23 Encounter for immunization: Secondary | ICD-10-CM | POA: Diagnosis not present

## 2019-11-23 DIAGNOSIS — L89311 Pressure ulcer of right buttock, stage 1: Secondary | ICD-10-CM | POA: Diagnosis not present

## 2019-11-23 DIAGNOSIS — Z6821 Body mass index (BMI) 21.0-21.9, adult: Secondary | ICD-10-CM | POA: Diagnosis not present

## 2019-11-23 DIAGNOSIS — E559 Vitamin D deficiency, unspecified: Secondary | ICD-10-CM | POA: Diagnosis not present

## 2019-11-23 DIAGNOSIS — G2581 Restless legs syndrome: Secondary | ICD-10-CM | POA: Diagnosis not present

## 2019-11-23 DIAGNOSIS — I5032 Chronic diastolic (congestive) heart failure: Secondary | ICD-10-CM | POA: Diagnosis not present

## 2019-11-23 DIAGNOSIS — E785 Hyperlipidemia, unspecified: Secondary | ICD-10-CM | POA: Diagnosis not present

## 2019-11-23 DIAGNOSIS — M545 Low back pain, unspecified: Secondary | ICD-10-CM | POA: Diagnosis not present

## 2019-11-23 DIAGNOSIS — E063 Autoimmune thyroiditis: Secondary | ICD-10-CM | POA: Diagnosis not present

## 2019-11-23 DIAGNOSIS — Z952 Presence of prosthetic heart valve: Secondary | ICD-10-CM | POA: Diagnosis not present

## 2020-01-26 DIAGNOSIS — M1990 Unspecified osteoarthritis, unspecified site: Secondary | ICD-10-CM | POA: Insufficient documentation

## 2020-01-28 ENCOUNTER — Ambulatory Visit: Payer: PPO | Admitting: Cardiology

## 2020-01-28 ENCOUNTER — Encounter: Payer: Self-pay | Admitting: Emergency Medicine

## 2020-01-28 ENCOUNTER — Other Ambulatory Visit: Payer: Self-pay

## 2020-01-28 ENCOUNTER — Encounter: Payer: Self-pay | Admitting: Cardiology

## 2020-01-28 VITALS — BP 130/70 | HR 88 | Ht 61.0 in | Wt 119.8 lb

## 2020-01-28 DIAGNOSIS — I350 Nonrheumatic aortic (valve) stenosis: Secondary | ICD-10-CM | POA: Diagnosis not present

## 2020-01-28 DIAGNOSIS — Z8774 Personal history of (corrected) congenital malformations of heart and circulatory system: Secondary | ICD-10-CM

## 2020-01-28 DIAGNOSIS — Z952 Presence of prosthetic heart valve: Secondary | ICD-10-CM

## 2020-01-28 DIAGNOSIS — Q211 Atrial septal defect: Secondary | ICD-10-CM

## 2020-01-28 DIAGNOSIS — I5032 Chronic diastolic (congestive) heart failure: Secondary | ICD-10-CM | POA: Diagnosis not present

## 2020-01-28 DIAGNOSIS — M1990 Unspecified osteoarthritis, unspecified site: Secondary | ICD-10-CM

## 2020-01-28 DIAGNOSIS — Q2111 Secundum atrial septal defect: Secondary | ICD-10-CM

## 2020-01-28 HISTORY — DX: Personal history of (corrected) congenital malformations of heart and circulatory system: Z87.74

## 2020-01-28 NOTE — Patient Instructions (Signed)

## 2020-01-28 NOTE — Progress Notes (Signed)
Cardiology Office Note:    Date:  01/28/2020   ID:  Denise Bailey, DOB 02-Dec-1945, MRN [id number]  PCP:  Penelope Coop, FNP  Cardiologist:  Berniece Salines, DO  Electrophysiologist:  None   Referring MD: Penelope Coop, FNP   " I am doing good"  History of Present Illness:    Denise Bailey is a 75 y.o. female with a hx of aortic valve stenosis status post TAVR on August 10, 2019, ASD with repair on September 24, 2019 with a 14 mm Amplatzer, severe tricuspid regurgitation she is here today for follow-up visit.  She appears to be doing well from a cardiovascular standpoint.  She is getting around the house pretty fine she tells me she has no complaints at this time.  She is very excited because her son from [place] visited her this holiday season and she enjoyed time with been with them.  Past Medical History:  Diagnosis Date  . Acute on chronic diastolic heart failure ([place]) 08/11/2019  . Arthritis   . ASD (atrial septal defect)   . Atrial septal defect   . Bilateral leg edema 03/15/2019  . Breast cancer ([place])   . Complication of anesthesia    patient reportd having difficult time waking up from anesthesia  . History of breast cancer    s/p mastectomy   . Hypothyroidism   . S/P TAVR (transcatheter aortic valve replacement) 08/10/2019   s/p TAVR with a 26 mm Medtronic Evolut Pro + via the TF approach by Dr. Burt Knack and Roxy Manns   . Severe aortic stenosis 03/15/2019  . Venous insufficiency 04/12/2019    Past Surgical History:  Procedure Laterality Date  . ABDOMINAL HYSTERECTOMY    . ATRIAL SEPTAL DEFECT(ASD) CLOSURE N/A 09/24/2019   Procedure: ATRIAL SEPTAL DEFECT (ASD) CLOSURE;  Surgeon: [name], MD;  Location: [facility];  Service: Cardiovascular;  Laterality: N/A;  . MASTECTOMY    . RIGHT/LEFT HEART CATH AND CORONARY ANGIOGRAPHY N/A 06/09/2019   Procedure: RIGHT/LEFT HEART CATH AND CORONARY ANGIOGRAPHY;  Surgeon: [name], MD;  Location: [facility];  Service: Cardiovascular;  Laterality: N/A;  . TEE WITHOUT CARDIOVERSION N/A 06/22/2019   Procedure: TRANSESOPHAGEAL ECHOCARDIOGRAM (TEE);  Surgeon: Geralynn Rile, MD;  Location: [facility];  Service: Cardiovascular;  Laterality: N/A;  . TEE WITHOUT CARDIOVERSION N/A 08/10/2019   Procedure: TRANSESOPHAGEAL ECHOCARDIOGRAM (TEE);  Surgeon: [name], MD;  Location: [facility];  Service: Open Heart Surgery;  Laterality: N/A;  . TRANSCATHETER AORTIC VALVE REPLACEMENT, TRANSFEMORAL N/A 08/10/2019   Procedure: TRANSCATHETER AORTIC VALVE REPLACEMENT, TRANSFEMORAL;  Surgeon: [name], MD;  Location: [facility];  Service: Open Heart Surgery;  Laterality: N/A;    Current Medications: Current Meds  Medication Sig  . acetaminophen (TYLENOL) 325 MG tablet Take 325-650 mg by mouth every 6 (six) hours as needed for mild pain or headache.  Marland Kitchen amoxicillin (AMOXIL) 500 MG tablet Take 4 capsules (2,000 mg) one hour prior to all dental visits.  Marland Kitchen aspirin EC 81 MG tablet Take 1 tablet (81 mg total) by mouth daily. Swallow whole.  . clopidogrel (PLAVIX) 75 MG tablet Take 1 tablet (75 mg total) by mouth daily with breakfast.  . fluticasone (FLONASE) 50 MCG/ACT nasal spray Place 1 spray into both nostrils daily as needed for allergies or rhinitis.  . furosemide (LASIX) 40 MG tablet Take 1 tablet (40 mg total) by mouth daily.  Marland Kitchen levothyroxine (SYNTHROID) 88 MCG tablet Take 88  mcg by mouth daily before breakfast.   . potassium chloride (KLOR-CON) 10 MEQ tablet Take 20 mEq by mouth daily.  Marland Kitchen rOPINIRole (REQUIP) 1 MG tablet   . traMADol (ULTRAM) 50 MG tablet Take 50 mg by mouth 3 (three) times daily as needed for severe pain.      Allergies:   Patient has no known allergies.   Social History   Socioeconomic History  . Marital status: Married    Spouse name: Not on file  . Number of children: Not on file  . Years of education: Not on file  . Highest education level: Not on  file  Occupational History  . Not on file  Tobacco Use  . Smoking status: Never Smoker  . Smokeless tobacco: Never Used  Vaping Use  . Vaping Use: Never used  Substance and Sexual Activity  . Alcohol use: Never  . Drug use: Never  . Sexual activity: Not on file  Other Topics Concern  . Not on file  Social History Narrative  . Not on file   Social Determinants of Health   Financial Resource Strain: Not on file  Food Insecurity: Not on file  Transportation Needs: Not on file  Physical Activity: Not on file  Stress: Not on file  Social Connections: Not on file     Family History: The patient's family history includes Diabetes in her brother; Stroke in her mother.  ROS:   Review of Systems  Constitution: Negative for decreased appetite, fever and weight gain.  HENT: Negative for congestion, ear discharge, hoarse voice and sore throat.   Eyes: Negative for discharge, redness, vision loss in right eye and visual halos.  Cardiovascular: Negative for chest pain, dyspnea on exertion, leg swelling, orthopnea and palpitations.  Respiratory: Negative for cough, hemoptysis, shortness of breath and snoring.   Endocrine: Negative for heat intolerance and polyphagia.  Hematologic/Lymphatic: Negative for bleeding problem. Does not bruise/bleed easily.  Skin: Negative for flushing, nail changes, rash and suspicious lesions.  Musculoskeletal: Negative for arthritis, joint pain, muscle cramps, myalgias, neck pain and stiffness.  Gastrointestinal: Negative for abdominal pain, bowel incontinence, diarrhea and excessive appetite.  Genitourinary: Negative for decreased libido, genital sores and incomplete emptying.  Neurological: Negative for brief paralysis, focal weakness, headaches and loss of balance.  Psychiatric/Behavioral: Negative for altered mental status, depression and suicidal ideas.  Allergic/Immunologic: Negative for HIV exposure and persistent infections.    EKGs/Labs/Other  Studies Reviewed:    The following studies were reviewed today:   EKG: None today  Recent Labs: 03/29/2019: NT-Pro BNP 737 08/05/2019: ALT 17; B Natriuretic Peptide 289.4 08/11/2019: Magnesium 1.7 09/22/2019: BUN 12; Creatinine, Ser 0.79; Hemoglobin 12.4; Platelets 133; Potassium 4.2; Sodium 142  Recent Lipid Panel No results found for: CHOL, TRIG, HDL, CHOLHDL, VLDL, LDLCALC, LDLDIRECT  Physical Exam:    VS:  BP 130/70   Pulse 88   Ht 5\' 1"  (1.549 m)   Wt 119 lb 12.8 oz (54.3 kg)   SpO2 94%   BMI 22.64 kg/m     Wt Readings from Last 3 Encounters:  01/28/20 119 lb 12.8 oz (54.3 kg)  10/28/19 118 lb 6.4 oz (53.7 kg)  09/24/19 120 lb (54.4 kg)     GEN: Well nourished, well developed in no acute distress HEENT: Normal NECK: No JVD; No carotid bruits LYMPHATICS: No lymphadenopathy CARDIAC: S1S2 noted,RRR, 3 out of 6 systolic ejection murmurs, rubs, gallops RESPIRATORY:  Clear to auscultation without rales, wheezing or rhonchi  ABDOMEN: Soft, non-tender,  non-distended, +bowel sounds, no guarding. EXTREMITIES: No edema, No cyanosis, no clubbing MUSCULOSKELETAL:  No deformity  SKIN: Warm and dry NEUROLOGIC:  Alert and oriented x 3, non-focal PSYCHIATRIC:  Normal affect, good insight  ASSESSMENT:    1. Secundum ASD   2. Chronic diastolic CHF (congestive heart failure) ([place])   3. Severe aortic stenosis   4. S/P TAVR (transcatheter aortic valve replacement)   5. Status post device closure of ASD    PLAN:     1.  She is doing well from a cardiovascular standpoint.  She is getting around her house without trouble.  We again went over the fact that the patient needs to be on aspirin and Plavix up until March 2022.  She was advised that she will need endocarditis prophylaxis moving forward.  2.  Leg edema has significantly improved. Blood pressure acceptable in the office no changes will be made  The patient is in agreement with the above plan. The patient left the office in  stable condition.  The patient will follow up in 3 months or sooner if needed.   Medication Adjustments/Labs and Tests Ordered: Current medicines are reviewed at length with the patient today.  Concerns regarding medicines are outlined above.  No orders of the defined types were placed in this encounter.  No orders of the defined types were placed in this encounter.   Patient Instructions  Medication Instructions:  Your physician recommends that you continue on your current medications as directed. Please refer to the Current Medication list given to you today.  *If you need a refill on your cardiac medications before your next appointment, please call your pharmacy*   Lab Work: None. If you have labs (blood work) drawn today and your tests are completely normal, you will receive your results only by: Marland Kitchen MyChart Message (if you have MyChart) OR . A paper copy in the mail If you have any lab test that is abnormal or we need to change your treatment, we will call you to review the results.   Testing/Procedures: None   Follow-Up: At [facility], you and your health needs are our priority.  As part of our continuing mission to provide you with exceptional heart care, we have created designated Provider Care Teams.  These Care Teams include your primary Cardiologist (physician) and Advanced Practice Providers (APPs -  Physician Assistants and Nurse Practitioners) who all work together to provide you with the care you need, when you need it.  We recommend signing up for the patient portal called "MyChart".  Sign up information is provided on this After Visit Summary.  MyChart is used to connect with patients for Virtual Visits (Telemedicine).  Patients are able to view lab/test results, encounter notes, upcoming appointments, etc.  Non-urgent messages can be sent to your provider as well.   To learn more about what you can do with MyChart, go to [email or web address].    Your next  appointment:   3 month(s)  The format for your next appointment:   In Person  Provider:   Berniece Salines, DO   Other Instructions       Adopting a Healthy Lifestyle.  Know what a healthy weight is for you (roughly BMI <25) and aim to maintain this   Aim for 7+ servings of fruits and vegetables daily   65-80+ fluid ounces of water or unsweet tea for healthy kidneys   Limit to max 1 drink of alcohol per day; avoid smoking/tobacco   Limit animal  fats in diet for cholesterol and heart health - choose grass fed whenever available   Avoid highly processed foods, and foods high in saturated/trans fats   Aim for low stress - take time to unwind and care for your mental health   Aim for 150 min of moderate intensity exercise weekly for heart health, and weights twice weekly for bone health   Aim for 7-9 hours of sleep daily   When it comes to diets, agreement about the perfect plan isnt easy to find, even among the experts. Experts at the [place] developed an idea known as the Healthy Eating Plate. Just imagine a plate divided into logical, healthy portions.   The emphasis is on diet quality:   Load up on vegetables and fruits - one-half of your plate: Aim for color and variety, and remember that potatoes dont count.   Go for whole grains - one-quarter of your plate: Whole wheat, barley, wheat berries, quinoa, oats, brown rice, and foods made with them. If you want pasta, go with whole wheat pasta.   Protein power - one-quarter of your plate: Fish, chicken, beans, and nuts are all healthy, versatile protein sources. Limit red meat.   The diet, however, does go beyond the plate, offering a few other suggestions.   Use healthy plant oils, such as olive, canola, soy, corn, sunflower and peanut. Check the labels, and avoid partially hydrogenated oil, which have unhealthy trans fats.   If youre thirsty, drink water. Coffee and tea are good in moderation,  but skip sugary drinks and limit milk and dairy products to one or two daily servings.   The type of carbohydrate in the diet is more important than the amount. Some sources of carbohydrates, such as vegetables, fruits, whole grains, and beans-are healthier than others.   Finally, stay active  Signed, Berniece Salines, DO  01/28/2020 1:36 PM    [facility]

## 2020-03-17 DIAGNOSIS — Z6822 Body mass index (BMI) 22.0-22.9, adult: Secondary | ICD-10-CM | POA: Diagnosis not present

## 2020-03-17 DIAGNOSIS — Z952 Presence of prosthetic heart valve: Secondary | ICD-10-CM | POA: Diagnosis not present

## 2020-03-17 DIAGNOSIS — M545 Low back pain, unspecified: Secondary | ICD-10-CM | POA: Diagnosis not present

## 2020-03-17 DIAGNOSIS — G2581 Restless legs syndrome: Secondary | ICD-10-CM | POA: Diagnosis not present

## 2020-03-17 DIAGNOSIS — K5900 Constipation, unspecified: Secondary | ICD-10-CM | POA: Diagnosis not present

## 2020-03-17 DIAGNOSIS — E785 Hyperlipidemia, unspecified: Secondary | ICD-10-CM | POA: Diagnosis not present

## 2020-03-17 DIAGNOSIS — E063 Autoimmune thyroiditis: Secondary | ICD-10-CM | POA: Diagnosis not present

## 2020-03-17 DIAGNOSIS — E559 Vitamin D deficiency, unspecified: Secondary | ICD-10-CM | POA: Diagnosis not present

## 2020-03-17 DIAGNOSIS — I509 Heart failure, unspecified: Secondary | ICD-10-CM | POA: Diagnosis not present

## 2020-03-17 DIAGNOSIS — J301 Allergic rhinitis due to pollen: Secondary | ICD-10-CM | POA: Diagnosis not present

## 2020-04-27 ENCOUNTER — Encounter: Payer: Self-pay | Admitting: Cardiology

## 2020-04-27 ENCOUNTER — Ambulatory Visit: Payer: PPO | Admitting: Cardiology

## 2020-04-27 ENCOUNTER — Other Ambulatory Visit: Payer: Self-pay

## 2020-04-27 VITALS — BP 102/60 | HR 65 | Ht 61.0 in | Wt 118.8 lb

## 2020-04-27 DIAGNOSIS — I5033 Acute on chronic diastolic (congestive) heart failure: Secondary | ICD-10-CM

## 2020-04-27 DIAGNOSIS — I350 Nonrheumatic aortic (valve) stenosis: Secondary | ICD-10-CM | POA: Diagnosis not present

## 2020-04-27 DIAGNOSIS — Q211 Atrial septal defect, unspecified: Secondary | ICD-10-CM

## 2020-04-27 DIAGNOSIS — Z952 Presence of prosthetic heart valve: Secondary | ICD-10-CM

## 2020-04-27 DIAGNOSIS — Q2111 Secundum atrial septal defect: Secondary | ICD-10-CM

## 2020-04-27 NOTE — Patient Instructions (Signed)

## 2020-04-27 NOTE — Progress Notes (Signed)
Cardiology Office Note:    Date:  04/28/2020   ID:  Denise Bailey, DOB Jan 15, 1946, MRN [id number]  PCP:  Penelope Coop, FNP  Cardiologist:  Berniece Salines, DO  Electrophysiologist:  None   Referring MD: Penelope Coop, FNP   I am doing fine from my heart but my hip has been a problem.  History of Present Illness:    Denise Bailey is a 75 y.o. female with a hx of  aortic valve stenosis status post TAVR on August 10, 2019, ASD with repair on September 24, 2019 with a 14 mm Amplatzer, severe tricuspidregurgitationshe is here today for follow-up visit.  I saw the patient on 10/28/2019 and at that time she was doing well from a CV standpoint. No medication changes were made. Today she tells me that she is plans to see orthopedics for evaluation for possible hip surgery to due to pain and ambulatory limitations.  Past Medical History:  Diagnosis Date  . Acute on chronic diastolic heart failure ([place]) 08/11/2019  . Arthritis   . ASD (atrial septal defect)   . Atrial septal defect   . Bilateral leg edema 03/15/2019  . Breast cancer ([place])   . Complication of anesthesia    patient reportd having difficult time waking up from anesthesia  . History of breast cancer    s/p mastectomy   . Hypothyroidism   . S/P TAVR (transcatheter aortic valve replacement) 08/10/2019   s/p TAVR with a 26 mm Medtronic Evolut Pro + via the TF approach by Dr. Burt Knack and Roxy Manns   . Severe aortic stenosis 03/15/2019  . Venous insufficiency 04/12/2019    Past Surgical History:  Procedure Laterality Date  . ABDOMINAL HYSTERECTOMY    . ATRIAL SEPTAL DEFECT(ASD) CLOSURE N/A 09/24/2019   Procedure: ATRIAL SEPTAL DEFECT (ASD) CLOSURE;  Surgeon: [name], MD;  Location: [facility];  Service: Cardiovascular;  Laterality: N/A;  . MASTECTOMY    . RIGHT/LEFT HEART CATH AND CORONARY ANGIOGRAPHY N/A 06/09/2019   Procedure: RIGHT/LEFT HEART CATH AND CORONARY ANGIOGRAPHY;  Surgeon: [name], MD;   Location: [facility];  Service: Cardiovascular;  Laterality: N/A;  . TEE WITHOUT CARDIOVERSION N/A 06/22/2019   Procedure: TRANSESOPHAGEAL ECHOCARDIOGRAM (TEE);  Surgeon: Geralynn Rile, MD;  Location: [facility];  Service: Cardiovascular;  Laterality: N/A;  . TEE WITHOUT CARDIOVERSION N/A 08/10/2019   Procedure: TRANSESOPHAGEAL ECHOCARDIOGRAM (TEE);  Surgeon: [name], MD;  Location: [facility];  Service: Open Heart Surgery;  Laterality: N/A;  . TRANSCATHETER AORTIC VALVE REPLACEMENT, TRANSFEMORAL N/A 08/10/2019   Procedure: TRANSCATHETER AORTIC VALVE REPLACEMENT, TRANSFEMORAL;  Surgeon: [name], MD;  Location: [facility];  Service: Open Heart Surgery;  Laterality: N/A;    Current Medications: Current Meds  Medication Sig  . acetaminophen (TYLENOL) 325 MG tablet Take 325-650 mg by mouth every 6 (six) hours as needed for mild pain or headache.  Marland Kitchen amoxicillin (AMOXIL) 500 MG tablet Take 4 capsules (2,000 mg) one hour prior to all dental visits.  Marland Kitchen aspirin EC 81 MG tablet Take 1 tablet (81 mg total) by mouth daily. Swallow whole.  . fluticasone (FLONASE) 50 MCG/ACT nasal spray Place 1 spray into both nostrils daily as needed for allergies or rhinitis.  . furosemide (LASIX) 40 MG tablet Take 1 tablet (40 mg total) by mouth daily.  Marland Kitchen levothyroxine (SYNTHROID) 88 MCG tablet Take 88 mcg by mouth daily before breakfast.   . potassium chloride (KLOR-CON) 10 MEQ tablet  Take 20 mEq by mouth daily.  Marland Kitchen rOPINIRole (REQUIP) 1 MG tablet Take 1 mg by mouth 3 (three) times daily.  . traMADol (ULTRAM) 50 MG tablet Take 50 mg by mouth 3 (three) times daily as needed for severe pain.   . [DISCONTINUED] clopidogrel (PLAVIX) 75 MG tablet Take 1 tablet (75 mg total) by mouth daily with breakfast.     Allergies:   Patient has no known allergies.   Social History   Socioeconomic History  . Marital status: Married    Spouse name: Not on file  . Number of children: Not  on file  . Years of education: Not on file  . Highest education level: Not on file  Occupational History  . Not on file  Tobacco Use  . Smoking status: Never Smoker  . Smokeless tobacco: Never Used  Vaping Use  . Vaping Use: Never used  Substance and Sexual Activity  . Alcohol use: Never  . Drug use: Never  . Sexual activity: Not on file  Other Topics Concern  . Not on file  Social History Narrative  . Not on file   Social Determinants of Health   Financial Resource Strain: Not on file  Food Insecurity: Not on file  Transportation Needs: Not on file  Physical Activity: Not on file  Stress: Not on file  Social Connections: Not on file     Family History: The patient's family history includes Diabetes in her brother; Stroke in her mother.  ROS:   Review of Systems  Constitution: Negative for decreased appetite, fever and weight gain.  HENT: Negative for congestion, ear discharge, hoarse voice and sore throat.   Eyes: Negative for discharge, redness, vision loss in right eye and visual halos.  Cardiovascular: Negative for chest pain, dyspnea on exertion, leg swelling, orthopnea and palpitations.  Respiratory: Negative for cough, hemoptysis, shortness of breath and snoring.   Endocrine: Negative for heat intolerance and polyphagia.  Hematologic/Lymphatic: Negative for bleeding problem. Does not bruise/bleed easily.  Skin: Negative for flushing, nail changes, rash and suspicious lesions.  Musculoskeletal: reports hip pain. Negative for arthritis, joint pain, muscle cramps, myalgias, neck pain and stiffness.  Gastrointestinal: Negative for abdominal pain, bowel incontinence, diarrhea and excessive appetite.  Genitourinary: Negative for decreased libido, genital sores and incomplete emptying.  Neurological: Negative for brief paralysis, focal weakness, headaches and loss of balance.  Psychiatric/Behavioral: Negative for altered mental status, depression and suicidal ideas.   Allergic/Immunologic: Negative for HIV exposure and persistent infections.    EKGs/Labs/Other Studies Reviewed:    The following studies were reviewed today:   EKG:  None today  Recent Labs: 08/05/2019: ALT 17; B Natriuretic Peptide 289.4 08/11/2019: Magnesium 1.7 09/22/2019: BUN 12; Creatinine, Ser 0.79; Hemoglobin 12.4; Platelets 133; Potassium 4.2; Sodium 142  Recent Lipid Panel No results found for: CHOL, TRIG, HDL, CHOLHDL, VLDL, LDLCALC, LDLDIRECT  Physical Exam:    VS:  BP 102/60   Pulse 65   Ht 5\' 1"  (1.549 m)   Wt 118 lb 12.8 oz (53.9 kg)   SpO2 94%   BMI 22.45 kg/m     Wt Readings from Last 3 Encounters:  04/27/20 118 lb 12.8 oz (53.9 kg)  01/28/20 119 lb 12.8 oz (54.3 kg)  10/28/19 118 lb 6.4 oz (53.7 kg)     GEN: Well nourished, well developed in no acute distress HEENT: Normal NECK: No JVD; No carotid bruits LYMPHATICS: No lymphadenopathy CARDIAC: S1S2 noted,RRR, no murmurs, rubs, gallops RESPIRATORY:  Clear to auscultation  without rales, wheezing or rhonchi  ABDOMEN: Soft, non-tender, non-distended, +bowel sounds, no guarding. EXTREMITIES: +1 bilateral leg edema, No cyanosis, no clubbing MUSCULOSKELETAL:  No deformity  SKIN: Warm and dry NEUROLOGIC:  Alert and oriented x 3, non-focal PSYCHIATRIC:  Normal affect, good insight  ASSESSMENT:    1. Aortic valve stenosis, etiology of cardiac valve disease unspecified   2. Secundum ASD   3. S/P TAVR (transcatheter aortic valve replacement)   4. Atrial septal defect   5. Severe aortic stenosis   6. Acute on chronic diastolic heart failure (H[facility])    PLAN:    She does have bilateral pitting edema - she does not take her lasix everyday. I have asked the patient to take her Lasix at least once a week to avoid fluid overload.   We will stop Plavix today.   I did advise the patient that moving forward she will need endocarditis prophylaxis.   Blood pressure is acceptable.   The patient is in agreement  with the above plan. The patient left the office in stable condition.  The patient will follow up in   Medication Adjustments/Labs and Tests Ordered: Current medicines are reviewed at length with the patient today.  Concerns regarding medicines are outlined above.  No orders of the defined types were placed in this encounter.  No orders of the defined types were placed in this encounter.   Patient Instructions  Medication Instructions:  Your physician recommends that you continue on your current medications as directed. Please refer to the Current Medication list given to you today.  *If you need a refill on your cardiac medications before your next appointment, please call your pharmacy*   Lab Work: None If you have labs (blood work) drawn today and your tests are completely normal, you will receive your results only by: Marland Kitchen MyChart Message (if you have MyChart) OR . A paper copy in the mail If you have any lab test that is abnormal or we need to change your treatment, we will call you to review the results.   Testing/Procedures: None   Follow-Up: At [facility], you and your health needs are our priority.  As part of our continuing mission to provide you with exceptional heart care, we have created designated Provider Care Teams.  These Care Teams include your primary Cardiologist (physician) and Advanced Practice Providers (APPs -  Physician Assistants and Nurse Practitioners) who all work together to provide you with the care you need, when you need it.  We recommend signing up for the patient portal called "MyChart".  Sign up information is provided on this After Visit Summary.  MyChart is used to connect with patients for Virtual Visits (Telemedicine).  Patients are able to view lab/test results, encounter notes, upcoming appointments, etc.  Non-urgent messages can be sent to your provider as well.   To learn more about what you can do with MyChart, go to  [email or web address].    Your next appointment:   6 month(s)  The format for your next appointment:   In Person  Provider:   Berniece Salines, DO   Other Instructions      Adopting a Healthy Lifestyle.  Know what a healthy weight is for you (roughly BMI <25) and aim to maintain this   Aim for 7+ servings of fruits and vegetables daily   65-80+ fluid ounces of water or unsweet tea for healthy kidneys   Limit to max 1 drink of alcohol per day; avoid smoking/tobacco  Limit animal fats in diet for cholesterol and heart health - choose grass fed whenever available   Avoid highly processed foods, and foods high in saturated/trans fats   Aim for low stress - take time to unwind and care for your mental health   Aim for 150 min of moderate intensity exercise weekly for heart health, and weights twice weekly for bone health   Aim for 7-9 hours of sleep daily   When it comes to diets, agreement about the perfect plan isnt easy to find, even among the experts. Experts at the [place] developed an idea known as the Healthy Eating Plate. Just imagine a plate divided into logical, healthy portions.   The emphasis is on diet quality:   Load up on vegetables and fruits - one-half of your plate: Aim for color and variety, and remember that potatoes dont count.   Go for whole grains - one-quarter of your plate: Whole wheat, barley, wheat berries, quinoa, oats, brown rice, and foods made with them. If you want pasta, go with whole wheat pasta.   Protein power - one-quarter of your plate: Fish, chicken, beans, and nuts are all healthy, versatile protein sources. Limit red meat.   The diet, however, does go beyond the plate, offering a few other suggestions.   Use healthy plant oils, such as olive, canola, soy, corn, sunflower and peanut. Check the labels, and avoid partially hydrogenated oil, which have unhealthy trans fats.   If youre thirsty, drink water.  Coffee and tea are good in moderation, but skip sugary drinks and limit milk and dairy products to one or two daily servings.   The type of carbohydrate in the diet is more important than the amount. Some sources of carbohydrates, such as vegetables, fruits, whole grains, and beans-are healthier than others.   Finally, stay active  Signed, Berniece Salines, DO  04/28/2020 10:26 PM    [facility]

## 2020-05-02 ENCOUNTER — Telehealth: Payer: Self-pay

## 2020-05-02 NOTE — Telephone Encounter (Signed)
-----   Message from Berniece Salines, DO sent at 04/28/2020 10:26 PM EDT ----- Regarding: stop plavix Please let the patient know that she can stop her plavix. I did not tell her this at her last visit.

## 2020-05-02 NOTE — Telephone Encounter (Signed)
Called patient. No answer at this time. Unable to leave a voicemail.

## 2020-05-08 ENCOUNTER — Telehealth: Payer: Self-pay

## 2020-05-08 NOTE — Telephone Encounter (Signed)
Tried calling patient multiple times. No answer, the line rings several times then stops. No one answers, attempted to call the other provided number. No answer as well, could not leave a message.

## 2020-05-11 ENCOUNTER — Telehealth: Payer: Self-pay

## 2020-05-11 NOTE — Telephone Encounter (Signed)
Spoke with patient about stopping Plavix per Dr. Terrial Rhodes suggestions. Patient verbalizes understanding. No questions or concerns at this time.

## 2020-05-11 NOTE — Telephone Encounter (Signed)
Tried to call patient, no answer. The phone rang several times and never went to voicemail. Unable to leave a message.

## 2020-06-06 ENCOUNTER — Encounter (HOSPITAL_COMMUNITY): Payer: Self-pay | Admitting: Cardiovascular Disease

## 2020-06-07 DIAGNOSIS — M87052 Idiopathic aseptic necrosis of left femur: Secondary | ICD-10-CM | POA: Diagnosis not present

## 2020-06-07 DIAGNOSIS — M1612 Unilateral primary osteoarthritis, left hip: Secondary | ICD-10-CM | POA: Diagnosis not present

## 2020-06-07 DIAGNOSIS — M25552 Pain in left hip: Secondary | ICD-10-CM | POA: Diagnosis not present

## 2020-06-08 DIAGNOSIS — Z79899 Other long term (current) drug therapy: Secondary | ICD-10-CM | POA: Diagnosis not present

## 2020-06-08 DIAGNOSIS — M79609 Pain in unspecified limb: Secondary | ICD-10-CM | POA: Diagnosis not present

## 2020-06-08 DIAGNOSIS — J439 Emphysema, unspecified: Secondary | ICD-10-CM | POA: Diagnosis not present

## 2020-06-08 DIAGNOSIS — Z01818 Encounter for other preprocedural examination: Secondary | ICD-10-CM | POA: Diagnosis not present

## 2020-06-08 DIAGNOSIS — E559 Vitamin D deficiency, unspecified: Secondary | ICD-10-CM | POA: Diagnosis not present

## 2020-06-15 ENCOUNTER — Telehealth: Payer: Self-pay

## 2020-06-15 NOTE — Telephone Encounter (Signed)
   [facility] Pre-operative Risk Assessment    Patient Name: Denise Bailey  DOB: Nov 03, 1945  MRN: [id number]   HEARTCARE STAFF: - Please ensure there is not already an duplicate clearance open for this procedure. - Under Visit Info/Reason for Call, type in Other and utilize the format Clearance MM/DD/YY or Clearance TBD. Do not use dashes or single digits. - If request is for dental extraction, please clarify the # of teeth to be extracted.  Request for surgical clearance:  1. What type of surgery is being performed? Left Total Hip Arthroplasty  2. When is this surgery scheduled? TBD   3. What type of clearance is required (medical clearance vs. Pharmacy clearance to hold med vs. Both)? BOTH  4. Are there any medications that need to be held prior to surgery and how long? Aspirin  5. Practice name and name of physician performing surgery? [facility]    6. What is the office phone number? [phone]   7.   What is the office fax number? [phone]  8.   Anesthesia type (None, local, MAC, general) ? General Anesthesia    Birder Robson 06/15/2020, 8:26 AM  _________________________________________________________________   (provider comments below)

## 2020-06-29 ENCOUNTER — Telehealth: Payer: Self-pay | Admitting: Cardiology

## 2020-06-29 NOTE — Telephone Encounter (Signed)
Follow Up:   Pt is calling to check on the status of the clearance. She said her surgeon's office said they had not received clearance.

## 2020-06-29 NOTE — Telephone Encounter (Signed)
Denise Bailey 75 year old female is requesting preoperative cardiac evaluation for left total hip arthroplasty.  She was last seen in the clinic on 04/27/2020.  She continues to do well from a cardiovascular standpoint.  No medication changes were made.   Her PMH includes aortic valve stenosis status post TAVR 08/10/19, ASD with repair 9/21, and severe tricuspid regurgitation.   May her aspirin be held prior to her surgery?  Thank you for your help.  Please direct response to CV DIV preop pool.  Jossie Ng. Aleysia Oltmann NP-C    06/29/2020, 11:49 AM [address] [place] [address] Office [phone] Fax [phone]

## 2020-06-30 ENCOUNTER — Other Ambulatory Visit: Payer: Self-pay | Admitting: Physician Assistant

## 2020-06-30 DIAGNOSIS — Z952 Presence of prosthetic heart valve: Secondary | ICD-10-CM

## 2020-07-03 NOTE — Telephone Encounter (Signed)
    Pt is returning call, she wanted to ask if Dr. Harriet Masson can call her back

## 2020-07-03 NOTE — Telephone Encounter (Signed)
    Denise Bailey DOB:  1945/11/17  MRN:  [id number]   Primary Cardiologist: Berniece Salines, DO  Chart reviewed as part of pre-operative protocol coverage. Given past medical history and time since last visit, based on ACC/AHA guidelines, Denise Bailey would be at acceptable risk for the planned procedure without further cardiovascular testing.   Patient previously contacted last week prior to procedure and was doing well from a CV standpoint. Per Dr. Harriet Masson, the patient may hold ASA 5-7 days prior to procedure then resume once ok from a surgical standpoint thereafter.   I will route this recommendation to the requesting party via Epic fax function and remove from pre-op pool.  Please call with questions.  Kathyrn Drown, NP 07/03/2020, 10:41 AM

## 2020-07-04 NOTE — Telephone Encounter (Signed)
I call patient back [phone], phone rang no voicemail box. Will try tomorrow as this is my half day.

## 2020-07-04 NOTE — Telephone Encounter (Signed)
Denise Bailey is calling to check on the status of this preop clearance and is requesting a callback to discuss.

## 2020-07-11 DIAGNOSIS — Z01818 Encounter for other preprocedural examination: Secondary | ICD-10-CM | POA: Diagnosis not present

## 2020-07-25 DIAGNOSIS — Z01818 Encounter for other preprocedural examination: Secondary | ICD-10-CM | POA: Diagnosis not present

## 2020-07-25 DIAGNOSIS — M87052 Idiopathic aseptic necrosis of left femur: Secondary | ICD-10-CM | POA: Diagnosis not present

## 2020-07-25 DIAGNOSIS — Z79899 Other long term (current) drug therapy: Secondary | ICD-10-CM | POA: Diagnosis not present

## 2020-07-25 DIAGNOSIS — R52 Pain, unspecified: Secondary | ICD-10-CM | POA: Diagnosis not present

## 2020-07-25 DIAGNOSIS — M79609 Pain in unspecified limb: Secondary | ICD-10-CM | POA: Diagnosis not present

## 2020-07-31 DIAGNOSIS — M1611 Unilateral primary osteoarthritis, right hip: Secondary | ICD-10-CM | POA: Diagnosis not present

## 2020-07-31 DIAGNOSIS — M87052 Idiopathic aseptic necrosis of left femur: Secondary | ICD-10-CM | POA: Diagnosis not present

## 2020-07-31 DIAGNOSIS — Z79899 Other long term (current) drug therapy: Secondary | ICD-10-CM | POA: Diagnosis not present

## 2020-07-31 DIAGNOSIS — M4800 Spinal stenosis, site unspecified: Secondary | ICD-10-CM | POA: Diagnosis not present

## 2020-07-31 DIAGNOSIS — Z471 Aftercare following joint replacement surgery: Secondary | ICD-10-CM | POA: Diagnosis not present

## 2020-07-31 DIAGNOSIS — J301 Allergic rhinitis due to pollen: Secondary | ICD-10-CM | POA: Diagnosis not present

## 2020-07-31 DIAGNOSIS — E039 Hypothyroidism, unspecified: Secondary | ICD-10-CM | POA: Diagnosis not present

## 2020-07-31 DIAGNOSIS — M1612 Unilateral primary osteoarthritis, left hip: Secondary | ICD-10-CM | POA: Diagnosis not present

## 2020-07-31 DIAGNOSIS — I509 Heart failure, unspecified: Secondary | ICD-10-CM | POA: Diagnosis not present

## 2020-07-31 DIAGNOSIS — E785 Hyperlipidemia, unspecified: Secondary | ICD-10-CM | POA: Diagnosis not present

## 2020-07-31 DIAGNOSIS — M87852 Other osteonecrosis, left femur: Secondary | ICD-10-CM | POA: Diagnosis not present

## 2020-07-31 DIAGNOSIS — Z96642 Presence of left artificial hip joint: Secondary | ICD-10-CM | POA: Diagnosis not present

## 2020-08-28 ENCOUNTER — Other Ambulatory Visit: Payer: PPO

## 2020-08-31 ENCOUNTER — Other Ambulatory Visit: Payer: Self-pay

## 2020-08-31 ENCOUNTER — Telehealth (INDEPENDENT_AMBULATORY_CARE_PROVIDER_SITE_OTHER): Payer: PPO | Admitting: Physician Assistant

## 2020-08-31 DIAGNOSIS — I10 Essential (primary) hypertension: Secondary | ICD-10-CM | POA: Diagnosis not present

## 2020-08-31 DIAGNOSIS — I5032 Chronic diastolic (congestive) heart failure: Secondary | ICD-10-CM

## 2020-08-31 DIAGNOSIS — Z952 Presence of prosthetic heart valve: Secondary | ICD-10-CM

## 2020-08-31 DIAGNOSIS — Z8774 Personal history of (corrected) congenital malformations of heart and circulatory system: Secondary | ICD-10-CM

## 2020-08-31 NOTE — Patient Instructions (Addendum)
Medication Instructions:  No changes *If you need a refill on your cardiac medications before your next appointment, please call your pharmacy*   Lab Work: none If you have labs (blood work) drawn today and your tests are completely normal, you will receive your results only by: [place] (if you have MyChart) OR A paper copy in the mail If you have any lab test that is abnormal or we need to change your treatment, we will call you to review the results.   Testing/Procedures: Echo has been rescheduled for 09/12/20 at 1:15 pm [place] office   Follow-Up: With Dr. Harriet Masson as planned. (October 2022)

## 2020-08-31 NOTE — Progress Notes (Signed)
[facility]   MULTIDISCIPLINARY HEART VALVE TEAM  Virtual Visit via Video Note   This visit type was conducted due to national recommendations for restrictions regarding the COVID-19 Pandemic (e.g. social distancing) in an effort to limit this patient's exposure and mitigate transmission in our community.  Due to her co-morbid illnesses, this patient is at least at moderate risk for complications without adequate follow up.  This format is felt to be most appropriate for this patient at this time.  All issues noted in this document were discussed and addressed.  A limited physical exam was performed with this format.  Please refer to the patient's chart for her consent to telehealth for Denise Bailey.  Evaluation Performed:  Follow-up visit  Date:  08/31/2020   ID:  Denise Bailey, DOB January 29, 1945, MRN [id number]  Patient Location: Home Provider Location: Office  PCP:  Penelope Coop, FNP  Cardiologist:  Berniece Salines, DO / Dr. Burt Knack & Dr. Roxy Manns (TAVR) Electrophysiologist:  None   Chief Complaint:  1 year s/p TAVR & ASD clsoure  History of Present Illness:    Denise Bailey is a 75 y.o. female with a history of  breast cancer s/p mastectomy, hypothyroidism, chronic LE edema with wounds, ASD, severe TR and severe aortic stenosis s/p TAVR (08/10/19) who presents to clinic for follow up.    Patient presented last year with worsening symptoms of chronic lower extremity edema with painful ulcerations in her legs. She was found to have a heart murmur and echocardiogram performed at [facility] on March 02, 2019 revealed severe aortic stenosis and normal left ventricular systolic function.  By report there was normal left ventricular systolic function with ejection fraction estimated 55 to 60%.  Peak velocity across the aortic valve was reported 4.1 m/s corresponding to mean transvalvular gradient estimated 48 mmHg.  There was mild to moderate tricuspid regurgitation  present with normal right ventricular size and systolic function.  She was initially evaluated by Dr. Harriet Masson and subsequently referred to Dr. Burt Knack.  Initially the patient did not wish to proceed with further diagnostic work-up for management of her aortic stenosis because of her ongoing symptoms of severe pain in both legs. She was seen in consultation by Dr. Trula Slade [facility] who recommended compression stockings for both lower legs. She called back in wanting to proceed with TAVR work up and subsequently underwent diagnostic cardiac catheterization by Dr. Burt Knack on Jun 09, 2019. Catheterization revealed normal coronary artery anatomy with no significant coronary artery disease. There was severe aortic stenosis.  Mean transvalvular gradient was measured 38 mmHg at catheterization corresponding to valve area calculated 1.0 cm. There was mild pulmonary hypertension. There was also elevated oxygen saturation in the right heart consistent with possible left to right shunt with Qp/Qs calculated 1.7:1.0 suggestive of arterial venous mixing.  CT angiography showed large secundum ASD. Follow up TEE confirmed ASD with adequate rims for closure. Plans were made for TAVR followed by percutaneous ASD closure. The patient was referred to the wound care clinic for her legs and she had significant improvement in her LE edema with wounds.   She was evaluated by the multidisciplinary valve team and underwent successful TAVR with a 26 mm Medtronic Evolut Pro+ THV via the TF approach on 08/10/19. Post operative echo showed EF 50-55%, normally functioning TAVR with a mean gradient of 6 mm hg and no PVL. There was severe TR and moderate RVE. She was discharged on aspirin and  plavix.   She later underwent successful transcatheter closure of an ostium secundum ASD using a 14 mm Amplatzer atrial septal occluder device. She was continued on aspirin and plavix. Post op limited echo showed EF 60-65% with normally  placed ASD closure, mild-mod MR, mild-mod TR, and normally functioning TAVR with a mean gradient of 3 mm hg and mild PVL.  Underwent hip surgery on 07/31/20 and doing well. Today she presents to clinic for follow up. Swelling has been much better since TAVR and ASD closure. Walking with a walker since hip surgery. No CP or SOB. No orthopnea or PND. No dizziness or syncope. No blood in stool or urine. No palpitations.    Past Medical History:  Diagnosis Date   Acute on chronic diastolic heart failure ([place]) 08/11/2019   Arthritis    ASD (atrial septal defect)    Atrial septal defect    Bilateral leg edema 03/15/2019   Breast cancer ([place])    Complication of anesthesia    patient reportd having difficult time waking up from anesthesia   History of breast cancer    s/p mastectomy    Hypothyroidism    S/P TAVR (transcatheter aortic valve replacement) 08/10/2019   s/p TAVR with a 26 mm Medtronic Evolut Pro + via the TF approach by Dr. Burt Knack and Roxy Manns    Severe aortic stenosis 03/15/2019   Venous insufficiency 04/12/2019   Past Surgical History:  Procedure Laterality Date   ABDOMINAL HYSTERECTOMY     ATRIAL SEPTAL DEFECT(ASD) CLOSURE N/A 09/24/2019   Procedure: ATRIAL SEPTAL DEFECT (ASD) CLOSURE;  Surgeon: [name], MD;  Location: [facility];  Service: Cardiovascular;  Laterality: N/A;   MASTECTOMY     RIGHT/LEFT HEART CATH AND CORONARY ANGIOGRAPHY N/A 06/09/2019   Procedure: RIGHT/LEFT HEART CATH AND CORONARY ANGIOGRAPHY;  Surgeon: [name], MD;  Location: [facility];  Service: Cardiovascular;  Laterality: N/A;   TEE WITHOUT CARDIOVERSION N/A 06/22/2019   Procedure: TRANSESOPHAGEAL ECHOCARDIOGRAM (TEE);  Surgeon: Geralynn Rile, MD;  Location: [facility];  Service: Cardiovascular;  Laterality: N/A;   TEE WITHOUT CARDIOVERSION N/A 08/10/2019   Procedure: TRANSESOPHAGEAL ECHOCARDIOGRAM (TEE);  Surgeon: [name], MD;  Location: [facility];  Service:  Open Heart Surgery;  Laterality: N/A;   TRANSCATHETER AORTIC VALVE REPLACEMENT, TRANSFEMORAL N/A 08/10/2019   Procedure: TRANSCATHETER AORTIC VALVE REPLACEMENT, TRANSFEMORAL;  Surgeon: [name], MD;  Location: [facility];  Service: Open Heart Surgery;  Laterality: N/A;     No outpatient medications have been marked as taking for the 08/31/20 encounter (Video Visit) with Eileen Stanford, PA-C.     Allergies:   Patient has no known allergies.   Social History   Tobacco Use   Smoking status: Never   Smokeless tobacco: Never  Vaping Use   Vaping Use: Never used  Substance Use Topics   Alcohol use: Never   Drug use: Never     Family Hx: The patient's family history includes Diabetes in her brother; Stroke in her mother.  ROS:   Please see the history of present illness.    All other systems reviewed and are negative.   Prior CV studies:   The following studies were reviewed today:  TAVR OPERATIVE NOTE   Date of Procedure:                08/10/2019   Preoperative Diagnosis:      Severe Aortic Stenosis    Postoperative Diagnosis:  Same    Procedure:        Transcatheter Aortic Valve Replacement - Percutaneous Right Transfemoral Approach             Medtronic CoreValve Evolut Pro (size 26 mm, serial # [id number])              Co-Surgeons:                        [name], MD and Valentina Gu. Roxy Manns, MD    Anesthesiologist:                  Midge Minium, MD   Echocardiographer:              Jenkins Rouge, MD   Pre-operative Echo Findings: Severe aortic stenosis Mild left ventricular systolic dysfunction Atrial septal defect Moderate-severe right ventricular dysfunction Moderate-severe tricuspid regurgitation   Post-operative Echo Findings: Trivial paravalvular leak Unchanged left ventricular systolic function   _____________     Echo 08/11/19: IMPRESSIONS   1. Abnormal septal motion inferior basal hypokinesis . Left ventricular   ejection fraction, by estimation, is 50 to 55%. The left ventricle has low  normal function.   2. Limited echo previous echo with moderate RVE and hypokinesis . Right  ventricular systolic function was not well visualized. The right  ventricular size is not well visualized.   3. Not assessed on this limited echo Known large secundum ASA.   4. Trivial mitral valve regurgitation.   5. Tricuspid valve regurgitation is severe.   6. Post TAVR with 26 mm Medtronic Evolut Pro valve In good position with  no significant PvL Peak velocity 1.5 m/sec mean gradient 6 peak 9.4 mmHg  with AVA 1.8 cm2 DVI 0.76. The aortic valve has been repaired/replaced.    _____________________   Echo 09/22/19 IMPRESSIONS  1. Left ventricular ejection fraction, by estimation, is 55 to 60%. The left ventricle has normal function. The left ventricle has no regional wall motion abnormalities. Left ventricular diastolic parameters were normal.  2. Right ventricular systolic function is normal. The right ventricular size is normal. There is normal pulmonary artery systolic pressure.  3. Left atrial size was mildly dilated.  4. Known large secondum ASD not well seen on current study.  5. Right atrial size was moderately dilated.  6. The mitral valve is normal in structure. Trivial mitral valve regurgitation. No evidence of mitral stenosis.  7. Tricuspid valve regurgitation is severe.  8. Post TAVR July 2021 with 26 mm Medtronic Evolut Pro valve Trivial PVL which was also present on post implant echo in July Stable low gardients . The aortic valve has been repaired/replaced. Aortic valve regurgitation is not visualized. No aortic  stenosis is present. There is a 26 mm CoreValve-Evolut Pro prosthetic (TAVR) valve present in the aortic position. Procedure Date: 08/10/19.  9. The inferior vena cava is normal in size with greater than 50% respiratory variability, suggesting right atrial pressure of 3 mmHg.   Comparison(s): 08/11/19  EF 50-55%. AV 31mHg peak PG, 643mg mean PG.  _______________________   09/24/19 ATRIAL SEPTAL DEFECT (ASD) CLOSURE   Conclusion  Successful transcatheter closure of an ostium secundum ASD using a 14 mm Amplatzer atrial septal occluder device under fluoroscopic and intracardiac echo guidance   Recommend: Aspirin and clopidogrel without interruption x6 months SBE prophylaxis x6 months Limited 2D echo prior to discharge this afternoon  _______________________   Echo 09/24/19 IMPRESSIONS  1. Left ventricular ejection fraction,  by estimation, is 60 to 65%. The  left ventricle has normal function. The left ventricle has no regional  wall motion abnormalities.   2. Right ventricular systolic function is normal. The right ventricular  size is normal. There is normal pulmonary artery systolic pressure. The  estimated right ventricular systolic pressure is [id number] mmHg.   3. An Amplatzer atrial septal occluder is well seated.   4. The mitral valve is normal in structure. Mild to moderate mitral valve  regurgitation. No evidence of mitral stenosis.   5. Tricuspid valve regurgitation is mild to moderate.   6. There is mild perivalvular leak in the area of the left coronary  ostium. The aortic valve has been repaired/replaced. Aortic valve  regurgitation is mild. No aortic stenosis is present. There is a 25 mm  Medtronic Evolut Pro valve present in the aortic   position. Procedure Date: 08/10/2019.   7. The inferior vena cava is normal in size with greater than 50%  respiratory variability, suggesting right atrial pressure of 3 mmHg.     Labs/Other Tests and Data Reviewed:    EKG:  No ECG reviewed.  Recent Labs: 09/22/2019: BUN 12; Creatinine, Ser 0.79; Hemoglobin 12.4; Platelets 133; Potassium 4.2; Sodium 142   Recent Lipid Panel No results found for: CHOL, TRIG, HDL, CHOLHDL, LDLCALC, LDLDIRECT  Wt Readings from Last 3 Encounters:  04/27/20 118 lb 12.8 oz (53.9 kg)  01/28/20 119 lb  12.8 oz (54.3 kg)  10/28/19 118 lb 6.4 oz (53.7 kg)     Objective:    Vital Signs:  There were no vitals taken for this visit.   Well nourished, well developed female in no acute distress.   ASSESSMENT & PLAN:    Severe AS s/p TAVR: doing well. LE edema improved since TAVR and ASD closure. She had NHYA class II symptoms but mostly limited by hip pain and recovery from recent surgery. Continue on aspirin alone. She understand the ongoing need for SBE prophylaxis. Will r/s her echo out in [place] since she missed it earlier this week.   ASD s/p percutaneous ASD closure:  LE edema improved since TAVR and ASD closure. Echo r/s for 09/12/20  HTN: she says this has been well controlled.   Chronic diastolic CHF: sounds well controlled. Continue on current dose of Lasix.  COVID-19 Education: The signs and symptoms of COVID-19 were discussed with the patient and how to seek care for testing (follow up with PCP or arrange E-visit).  The importance of social distancing was discussed today.  Time:   Today, I have spent 20 minutes with the patient with telehealth technology discussing the above problems.     Medication Adjustments/Labs and Tests Ordered: Current medicines are reviewed at length with the patient today.  Concerns regarding medicines are outlined above.   Tests Ordered: No orders of the defined types were placed in this encounter.   Medication Changes: No orders of the defined types were placed in this encounter.    Disposition:  Follow up  with Dr. Harriet Masson as previously scheduled  Signed, Angelena Form, PA-C  08/31/2020 4:06 PM    [place]

## 2020-09-05 ENCOUNTER — Other Ambulatory Visit: Payer: Self-pay

## 2020-09-05 DIAGNOSIS — Q211 Atrial septal defect, unspecified: Secondary | ICD-10-CM

## 2020-09-05 DIAGNOSIS — Z952 Presence of prosthetic heart valve: Secondary | ICD-10-CM

## 2020-09-12 ENCOUNTER — Other Ambulatory Visit: Payer: Self-pay

## 2020-09-12 ENCOUNTER — Other Ambulatory Visit: Payer: PPO

## 2020-09-12 ENCOUNTER — Ambulatory Visit (INDEPENDENT_AMBULATORY_CARE_PROVIDER_SITE_OTHER): Payer: PPO

## 2020-09-12 DIAGNOSIS — Z952 Presence of prosthetic heart valve: Secondary | ICD-10-CM

## 2020-09-12 DIAGNOSIS — Q211 Atrial septal defect, unspecified: Secondary | ICD-10-CM

## 2020-09-12 LAB — ECHOCARDIOGRAM COMPLETE BUBBLE STUDY
AR max vel: 1.47 cm2
AV Area VTI: 1.38 cm2
AV Area mean vel: 1.42 cm2
AV Mean grad: 5 mmHg
AV Peak grad: 8.6 mmHg
Ao pk vel: 1.47 m/s
Area-P 1/2: 4.15 cm2
MV M vel: 5.45 m/s
MV Peak grad: 118.8 mmHg
Radius: 0.7 cm
S' Lateral: 2.7 cm

## 2020-09-13 ENCOUNTER — Ambulatory Visit: Payer: PPO | Admitting: Physician Assistant

## 2020-09-13 ENCOUNTER — Other Ambulatory Visit (HOSPITAL_COMMUNITY): Payer: PPO

## 2020-09-18 ENCOUNTER — Telehealth: Payer: Self-pay | Admitting: Physician Assistant

## 2020-09-18 NOTE — Telephone Encounter (Signed)
Eileen Stanford, PA-C  09/13/2020  4:20 AM EDT     EF 60%, normally functioning TAVR with a mean gradient of 5 mmHg and no PVL. Normal functioning of ASD closure with negative bubble study    The patient has been notified of the result and verbalized understanding.  All questions (if any) were answered. Nuala Alpha, LPN [id number] AM

## 2020-09-18 NOTE — Telephone Encounter (Signed)
-----   Message from Eileen Stanford, PA-C sent at 09/13/2020  4:20 AM EDT ----- EF 60%, normally functioning TAVR with a mean gradient of 5 mmHg and no PVL. Normal functioning of ASD closure with negative bubble study

## 2020-09-18 NOTE — Telephone Encounter (Signed)
The patient has been notified of the result and verbalized understanding.  All questions (if any) were answered. Nuala Alpha, LPN [id number] AM

## 2020-09-18 NOTE — Telephone Encounter (Signed)
   Pt is returning call to get her echo result, she said to call her cell # [phone]

## 2020-09-26 DIAGNOSIS — Z96642 Presence of left artificial hip joint: Secondary | ICD-10-CM | POA: Diagnosis not present

## 2020-09-26 DIAGNOSIS — M87052 Idiopathic aseptic necrosis of left femur: Secondary | ICD-10-CM | POA: Diagnosis not present

## 2020-10-30 DIAGNOSIS — Z79899 Other long term (current) drug therapy: Secondary | ICD-10-CM | POA: Diagnosis not present

## 2020-10-30 DIAGNOSIS — I509 Heart failure, unspecified: Secondary | ICD-10-CM | POA: Diagnosis not present

## 2020-10-30 DIAGNOSIS — M545 Low back pain, unspecified: Secondary | ICD-10-CM | POA: Diagnosis not present

## 2020-10-30 DIAGNOSIS — Z1331 Encounter for screening for depression: Secondary | ICD-10-CM | POA: Diagnosis not present

## 2020-10-30 DIAGNOSIS — Z952 Presence of prosthetic heart valve: Secondary | ICD-10-CM | POA: Diagnosis not present

## 2020-10-30 DIAGNOSIS — Z23 Encounter for immunization: Secondary | ICD-10-CM | POA: Diagnosis not present

## 2020-10-30 DIAGNOSIS — E063 Autoimmune thyroiditis: Secondary | ICD-10-CM | POA: Diagnosis not present

## 2020-10-30 DIAGNOSIS — E785 Hyperlipidemia, unspecified: Secondary | ICD-10-CM | POA: Diagnosis not present

## 2020-10-30 DIAGNOSIS — E559 Vitamin D deficiency, unspecified: Secondary | ICD-10-CM | POA: Diagnosis not present

## 2020-10-30 DIAGNOSIS — Z9181 History of falling: Secondary | ICD-10-CM | POA: Diagnosis not present

## 2020-10-30 DIAGNOSIS — G2581 Restless legs syndrome: Secondary | ICD-10-CM | POA: Diagnosis not present

## 2020-10-30 DIAGNOSIS — Z139 Encounter for screening, unspecified: Secondary | ICD-10-CM | POA: Diagnosis not present

## 2020-11-09 DIAGNOSIS — D649 Anemia, unspecified: Secondary | ICD-10-CM | POA: Diagnosis not present

## 2020-11-10 ENCOUNTER — Telehealth: Payer: Self-pay | Admitting: Physician Assistant

## 2020-11-10 NOTE — Telephone Encounter (Signed)
Patient had labs done recently and her Hemoglobin and Iron were low. Her PCP recommended she take an iron supplement. She wanted to know if Dr. Burt Knack and Nell Range thought that would be a safe thing for her to take. Please advise

## 2020-11-11 NOTE — Telephone Encounter (Signed)
Very safe. Would agree with PCP.

## 2020-11-13 NOTE — Telephone Encounter (Signed)
Pt aware of response and verbalizes understanding ./cy

## 2020-11-13 NOTE — Telephone Encounter (Signed)
Agree with Nell Range, thanks

## 2020-11-20 DIAGNOSIS — D649 Anemia, unspecified: Secondary | ICD-10-CM | POA: Diagnosis not present

## 2020-12-01 DIAGNOSIS — R350 Frequency of micturition: Secondary | ICD-10-CM | POA: Diagnosis not present

## 2020-12-01 DIAGNOSIS — Z6821 Body mass index (BMI) 21.0-21.9, adult: Secondary | ICD-10-CM | POA: Diagnosis not present

## 2020-12-22 ENCOUNTER — Ambulatory Visit: Payer: PPO | Admitting: Cardiology

## 2020-12-22 ENCOUNTER — Encounter: Payer: Self-pay | Admitting: Cardiology

## 2020-12-22 ENCOUNTER — Other Ambulatory Visit: Payer: Self-pay

## 2020-12-22 VITALS — BP 124/62 | HR 76 | Wt 119.2 lb

## 2020-12-22 DIAGNOSIS — Z952 Presence of prosthetic heart valve: Secondary | ICD-10-CM

## 2020-12-22 DIAGNOSIS — I10 Essential (primary) hypertension: Secondary | ICD-10-CM

## 2020-12-22 DIAGNOSIS — E7800 Pure hypercholesterolemia, unspecified: Secondary | ICD-10-CM

## 2020-12-22 DIAGNOSIS — Z8774 Personal history of (corrected) congenital malformations of heart and circulatory system: Secondary | ICD-10-CM

## 2020-12-22 DIAGNOSIS — I5032 Chronic diastolic (congestive) heart failure: Secondary | ICD-10-CM | POA: Diagnosis not present

## 2020-12-22 DIAGNOSIS — D508 Other iron deficiency anemias: Secondary | ICD-10-CM | POA: Diagnosis not present

## 2020-12-22 MED ORDER — PRAVASTATIN SODIUM 20 MG PO TABS: 20.0000 mg | ORAL_TABLET | Freq: Every evening | ORAL | 3 refills | Status: DC

## 2020-12-22 NOTE — Patient Instructions (Signed)
Medication Instructions:  Your physician has recommended you make the following change in your medication:  DECREASE: Iron to every other day START: Pravastatin 20 mg take one tablet by mouth daily.  *If you need a refill on your cardiac medications before your next appointment, please call your pharmacy*   Lab Work: Your physician recommends that you return for lab work in: 2 months  CMP, Lipids If you have labs (blood work) drawn today and your tests are completely normal, you will receive your results only by: [place] (if you have [place]) OR A paper copy in the mail If you have any lab test that is abnormal or we need to change your treatment, we will call you to review the results.   Testing/Procedures: Your physician has requested that you have an echocardiogram in August of 2023. Echocardiography is a painless test that uses sound waves to create images of your heart. It provides your doctor with information about the size and shape of your heart and how well your heart's chambers and valves are working. This procedure takes approximately one hour. There are no restrictions for this procedure.    Follow-Up: At [facility], you and your health needs are our priority.  As part of our continuing mission to provide you with exceptional heart care, we have created designated Provider Care Teams.  These Care Teams include your primary Cardiologist (physician) and Advanced Practice Providers (APPs -  Physician Assistants and Nurse Practitioners) who all work together to provide you with the care you need, when you need it.  We recommend signing up for the patient portal called "MyChart".  Sign up information is provided on this After Visit Summary.  MyChart is used to connect with patients for Virtual Visits (Telemedicine).  Patients are able to view lab/test results, encounter notes, upcoming appointments, etc.  Non-urgent messages can be sent to your provider as well.   To learn  more about what you can do with MyChart, go to [email or web address].    Your next appointment:   8 month(s)  The format for your next appointment:   In Person  Provider:   Shirlee More, MD    Other Instructions

## 2020-12-22 NOTE — Progress Notes (Signed)
Cardiology Office Note:    Date:  12/22/2020   ID:  Denise Bailey, DOB 1945/04/29, MRN [id number]  PCP:  Denise Coop, FNP  Cardiologist:  Denise More, MD    Referring MD: Denise Coop, FNP    ASSESSMENT:    1. S/P TAVR (transcatheter aortic valve replacement)   2. Status post device closure of ASD   3. Essential hypertension   4. Chronic diastolic CHF (congestive heart failure) ([place])   5. Pure hypercholesterolemia   6. Other iron deficiency anemia    PLAN:    In order of problems listed above:  She has an excellent result from his inventions including TAVR normal function and ASD closure complaint.  Heart failure is compensated from my perspective she is optimized if she has additional orthopedic surgery after the first of the year. BP controlled continue her diuretic Heart failure compensated continue her loop diuretic she takes it. Initiate lipid-lowering therapy guideline directed with aortic stenosis and AVR Having trouble tolerating iron I told her she could use children's chewable vitamin over-the-counter 1 every other day best tolerated best observed and avoids iron blockade intestinal   Next appointment: 12 an echocardiogram performed in August and see me afterwards   Medication Adjustments/Labs and Tests Ordered: Current medicines are reviewed at length with the patient today.  Concerns regarding medicines are outlined above.  No orders of the defined types were placed in this encounter.  No orders of the defined types were placed in this encounter.   Chief Complaint  Patient presents with   Follow-up   Aortic Stenosis    she has had tablet also closure atrial septal defect   History of Present Illness:    Denise Bailey is a 75 y.o. female with a hx of aortic valve stenosis status post TAVR on August 10, 2019, ASD with repair on September 24, 2019 with a 14 mm Amplatzer, severe tricuspid regurgitation  last seen by DR Tobb 04/27/2020.  He was  also seen iron deficiency.  Compliance with diet, lifestyle and medications: Yes  Her last echocardiogram 09/12/2020 showed normal left ventricular size mild left ventricular hypertrophy normal diastolic filling pressure normal systolic function EF 60 to 65%.  Right ventricle is normal in size function with moderately elevated pulmonary artery systolic pressure.  Her Amplatzer device was well visualized and there is no evidence of shunt with contrast injection.  TAVR function normal.  Cusp regurgitation at that time moderate mitral regurgitation moderate likely degenerative valve.  She anticipates total hip arthroplasty in January Told her from my perspective she is optimized and a good surgical candidate. She has had an excellent result with her valve intervention no edema shortness of breath chest pain palpitation or syncope. She has hyperlipidemia guidelines tell us that patients with AVR should be on a statin I will place her on a low-dose of pravastatin and follow-up in labs CMP lipid profile. She is on iron is having trouble tolerating and I told her that children's chewable vitamins with iron is the best tolerated 1 and taking every other day avoid iron blockade. She is no longer on Requip for restless legs Past Medical History:  Diagnosis Date   Acute on chronic diastolic heart failure ([place]) 08/11/2019   Arthritis    ASD (atrial septal defect)    Atrial septal defect    Bilateral leg edema 03/15/2019   Breast cancer ([place])    Complication of anesthesia    patient reportd having difficult time waking  up from anesthesia   History of breast cancer    s/p mastectomy    Hypothyroidism    S/P TAVR (transcatheter aortic valve replacement) 08/10/2019   s/p TAVR with a 26 mm Medtronic Evolut Pro + via the TF approach by Dr. Burt Knack and Roxy Manns    Severe aortic stenosis 03/15/2019   Venous insufficiency 04/12/2019    Past Surgical History:  Procedure Laterality Date   ABDOMINAL HYSTERECTOMY      ATRIAL SEPTAL DEFECT(ASD) CLOSURE N/A 09/24/2019   Procedure: ATRIAL SEPTAL DEFECT (ASD) CLOSURE;  Surgeon: [name], MD;  Location: [facility];  Service: Cardiovascular;  Laterality: N/A;   MASTECTOMY     RIGHT/LEFT HEART CATH AND CORONARY ANGIOGRAPHY N/A 06/09/2019   Procedure: RIGHT/LEFT HEART CATH AND CORONARY ANGIOGRAPHY;  Surgeon: [name], MD;  Location: [facility];  Service: Cardiovascular;  Laterality: N/A;   TEE WITHOUT CARDIOVERSION N/A 06/22/2019   Procedure: TRANSESOPHAGEAL ECHOCARDIOGRAM (TEE);  Surgeon: Geralynn Rile, MD;  Location: [facility];  Service: Cardiovascular;  Laterality: N/A;   TEE WITHOUT CARDIOVERSION N/A 08/10/2019   Procedure: TRANSESOPHAGEAL ECHOCARDIOGRAM (TEE);  Surgeon: [name], MD;  Location: [facility];  Service: Open Heart Surgery;  Laterality: N/A;   TRANSCATHETER AORTIC VALVE REPLACEMENT, TRANSFEMORAL N/A 08/10/2019   Procedure: TRANSCATHETER AORTIC VALVE REPLACEMENT, TRANSFEMORAL;  Surgeon: [name], MD;  Location: [facility];  Service: Open Heart Surgery;  Laterality: N/A;    Current Medications: Current Meds  Medication Sig   acetaminophen (TYLENOL) 325 MG tablet Take 325-650 mg by mouth every 6 (six) hours as needed for mild pain or headache.   amoxicillin (AMOXIL) 500 MG tablet Take 4 capsules (2,000 mg) one hour prior to all dental visits.   aspirin EC 81 MG tablet Take 1 tablet (81 mg total) by mouth daily. Swallow whole.   fluticasone (FLONASE) 50 MCG/ACT nasal spray Place 1 spray into both nostrils daily as needed for allergies or rhinitis.   furosemide (LASIX) 40 MG tablet Take 1 tablet (40 mg total) by mouth daily. (Patient taking differently: Take 40 mg by mouth daily as needed for fluid or edema.)   levothyroxine (SYNTHROID) 88 MCG tablet Take 88 mcg by mouth daily before breakfast.    potassium chloride (KLOR-CON) 10 MEQ tablet Take 20 mEq by mouth daily as needed (when  taking Lasix).   traMADol (ULTRAM) 50 MG tablet Take 50 mg by mouth 3 (three) times daily as needed for severe pain.      Allergies:   Patient has no known allergies.   Social History   Socioeconomic History   Marital status: Married    Spouse name: Not on file   Number of children: Not on file   Years of education: Not on file   Highest education level: Not on file  Occupational History   Not on file  Tobacco Use   Smoking status: Never   Smokeless tobacco: Never  Vaping Use   Vaping Use: Never used  Substance and Sexual Activity   Alcohol use: Never   Drug use: Never   Sexual activity: Not on file  Other Topics Concern   Not on file  Social History Narrative   Not on file   Social Determinants of Health   Financial Resource Strain: Not on file  Food Insecurity: Not on file  Transportation Needs: Not on file  Physical Activity: Not on file  Stress: Not on file  Social Connections: Not on file  Family History: The patient's family history includes Diabetes in her brother; Stroke in her mother. ROS:   Please see the history of present illness.    All other systems reviewed and are negative.  EKGs/Labs/Other Studies Reviewed:    The following studies were reviewed today:   Recent Labs: 10/30/2020: Cholesterol 245 LDL 153 A1c 5.4% hemoglobin 10.5 creatinine 0.73  Physical Exam:    VS:  BP 124/62   Pulse 76   Ht (P) 5\' 1"  (1.549 m)   Wt 119 lb 3.2 oz (54.1 kg)   SpO2 97%   BMI (P) 22.52 kg/m     Wt Readings from Last 3 Encounters:  12/22/20 119 lb 3.2 oz (54.1 kg)  04/27/20 118 lb 12.8 oz (53.9 kg)  01/28/20 119 lb 12.8 oz (54.3 kg)     GEN:  Well nourished, well developed in no acute distress HEENT: Normal NECK: No JVD; No carotid bruits LYMPHATICS: No lymphadenopathy CARDIAC: Normal exam for tablet RRR, no murmurs, rubs, gallops RESPIRATORY:  Clear to auscultation without rales, wheezing or rhonchi  ABDOMEN: Soft, non-tender,  non-distended MUSCULOSKELETAL:  No edema; No deformity  SKIN: Warm and dry NEUROLOGIC:  Alert and oriented x 3 PSYCHIATRIC:  Normal affect    Signed, Denise More, MD  12/22/2020 1:14 PM    [facility]

## 2020-12-22 NOTE — Addendum Note (Signed)
Addended by: Resa Miner I on: 12/22/2020 01:23 PM   Modules accepted: Orders

## 2021-01-10 DIAGNOSIS — Z9181 History of falling: Secondary | ICD-10-CM | POA: Diagnosis not present

## 2021-01-10 DIAGNOSIS — E785 Hyperlipidemia, unspecified: Secondary | ICD-10-CM | POA: Diagnosis not present

## 2021-01-10 DIAGNOSIS — Z0000 Encounter for general adult medical examination without abnormal findings: Secondary | ICD-10-CM | POA: Diagnosis not present

## 2021-01-10 DIAGNOSIS — Z1331 Encounter for screening for depression: Secondary | ICD-10-CM | POA: Diagnosis not present

## 2021-01-16 ENCOUNTER — Other Ambulatory Visit: Payer: Self-pay

## 2021-01-16 ENCOUNTER — Ambulatory Visit (INDEPENDENT_AMBULATORY_CARE_PROVIDER_SITE_OTHER): Payer: PPO

## 2021-01-16 DIAGNOSIS — Z952 Presence of prosthetic heart valve: Secondary | ICD-10-CM | POA: Diagnosis not present

## 2021-01-16 LAB — ECHOCARDIOGRAM COMPLETE
AR max vel: 1.11 cm2
AV Area VTI: 1.04 cm2
AV Area mean vel: 1.12 cm2
AV Mean grad: 6 mmHg
AV Peak grad: 11.3 mmHg
Ao pk vel: 1.68 m/s
Area-P 1/2: 2.87 cm2
MV M vel: 4.81 m/s
MV Peak grad: 92.5 mmHg
Radius: 0.7 cm
S' Lateral: 2.7 cm

## 2021-02-05 HISTORY — PX: TOTAL HIP ARTHROPLASTY: SHX124

## 2021-03-20 ENCOUNTER — Telehealth: Payer: Self-pay | Admitting: Emergency Medicine

## 2021-03-20 LAB — COMPREHENSIVE METABOLIC PANEL
ALT: 9 IU/L (ref 0–32)
AST: 19 IU/L (ref 0–40)
Albumin/Globulin Ratio: 1.6 (ref 1.2–2.2)
Albumin: 4.1 g/dL (ref 3.7–4.7)
Alkaline Phosphatase: 173 IU/L — ABNORMAL HIGH (ref 44–121)
BUN/Creatinine Ratio: 19 (ref 12–28)
BUN: 15 mg/dL (ref 8–27)
Bilirubin Total: 0.3 mg/dL (ref 0.0–1.2)
CO2: 23 mmol/L (ref 20–29)
Calcium: 9.1 mg/dL (ref 8.7–10.3)
Chloride: 107 mmol/L — ABNORMAL HIGH (ref 96–106)
Creatinine, Ser: 0.8 mg/dL (ref 0.57–1.00)
Globulin, Total: 2.6 g/dL (ref 1.5–4.5)
Glucose: 74 mg/dL (ref 70–99)
Potassium: 4.4 mmol/L (ref 3.5–5.2)
Sodium: 143 mmol/L (ref 134–144)
Total Protein: 6.7 g/dL (ref 6.0–8.5)
eGFR: 76 mL/min/{1.73_m2} (ref >59)

## 2021-03-20 LAB — LIPID PANEL
Chol/HDL Ratio: 2.7 ratio (ref 0.0–4.4)
Cholesterol, Total: 185 mg/dL (ref 100–199)
HDL: 68 mg/dL (ref >39)
LDL Chol Calc (NIH): 104 mg/dL — ABNORMAL HIGH (ref 0–99)
Triglycerides: 71 mg/dL (ref 0–149)
VLDL Cholesterol Cal: 13 mg/dL (ref 5–40)

## 2021-03-20 NOTE — Telephone Encounter (Signed)
Called patient, went over results and recommendations. Pt verbalized understanding, and questions, if any, were answered.  ?

## 2021-03-20 NOTE — Telephone Encounter (Signed)
-----   Message from Richardo Priest, MD sent at 03/20/2021  7:38 AM EST ----- Normal or stable result  Continue pravastatin

## 2021-06-21 ENCOUNTER — Ambulatory Visit (INDEPENDENT_AMBULATORY_CARE_PROVIDER_SITE_OTHER): Payer: PPO

## 2021-06-21 DIAGNOSIS — Z952 Presence of prosthetic heart valve: Secondary | ICD-10-CM | POA: Diagnosis not present

## 2021-06-21 LAB — ECHOCARDIOGRAM COMPLETE
AR max vel: 1.16 cm2
AV Area VTI: 1.27 cm2
AV Area mean vel: 1.31 cm2
AV Mean grad: 6 mmHg
AV Peak grad: 11.6 mmHg
Ao pk vel: 1.7 m/s
Area-P 1/2: 3.42 cm2
S' Lateral: 2.7 cm

## 2021-06-22 ENCOUNTER — Telehealth: Payer: Self-pay | Admitting: Cardiology

## 2021-06-22 NOTE — Telephone Encounter (Signed)
Patient informed of results.  

## 2021-06-22 NOTE — Telephone Encounter (Signed)
Patient was returning call for results. Please advise °

## 2021-08-21 ENCOUNTER — Encounter: Payer: Self-pay | Admitting: *Deleted

## 2021-08-22 ENCOUNTER — Encounter: Payer: Self-pay | Admitting: Cardiology

## 2021-08-22 ENCOUNTER — Other Ambulatory Visit: Payer: Self-pay

## 2021-08-22 ENCOUNTER — Ambulatory Visit: Payer: PPO | Admitting: Cardiology

## 2021-08-22 VITALS — BP 110/70 | HR 57 | Ht 61.0 in | Wt 123.0 lb

## 2021-08-22 DIAGNOSIS — I10 Essential (primary) hypertension: Secondary | ICD-10-CM

## 2021-08-22 DIAGNOSIS — Z952 Presence of prosthetic heart valve: Secondary | ICD-10-CM | POA: Diagnosis not present

## 2021-08-22 DIAGNOSIS — E7800 Pure hypercholesterolemia, unspecified: Secondary | ICD-10-CM

## 2021-08-22 DIAGNOSIS — Q211 Atrial septal defect, unspecified: Secondary | ICD-10-CM

## 2021-08-22 DIAGNOSIS — I5032 Chronic diastolic (congestive) heart failure: Secondary | ICD-10-CM

## 2021-08-22 MED ORDER — PRAVASTATIN SODIUM 20 MG PO TABS: 20.0000 mg | ORAL_TABLET | Freq: Every evening | ORAL | 3 refills | Status: AC

## 2021-08-22 NOTE — Progress Notes (Signed)
Cardiology Office Note:    Date:  08/22/2021   ID:  Denise Bailey, DOB 07/14/1945, MRN [id number]  PCP:  Penelope Coop, FNP  Cardiologist:  Shirlee More, MD    Referring MD: Penelope Coop, FNP    ASSESSMENT:    1. S/P TAVR (transcatheter aortic valve replacement)   2. Chronic diastolic CHF (congestive heart failure) ([place])   3. Essential hypertension   4. Atrial septal defect   5. Pure hypercholesterolemia    PLAN:    In order of problems listed above:  She has made a good recovery after TAVR New York Heart Association class I is on aspirin but unfortunately stopped taking her statin I placed her back on pravastatin with follow-up labs in 2 months including CBC and CMP Hypertension well-controlled BP is at target no longer taking a loop diuretic no evidence of volume overload after successful TAVR and ASD repair Restart lipid-lowering treatment   Next appointment: 1 year   Medication Adjustments/Labs and Tests Ordered: Current medicines are reviewed at length with the patient today.  Concerns regarding medicines are outlined above.  Orders Placed This Encounter  Procedures   EKG 12-Lead   No orders of the defined types were placed in this encounter.   Chief Complaint  Patient presents with   Follow-up  After TAVR  History of Present Illness:    Denise Bailey is a 76 y.o. female with a hx of severe symptomatic aortic stenosis with TAVR 08/18/2019 and ASD repair with 14 mm Amplatzer device severe tricuspid regurgitation and iron deficiency anemia.  She was last seen 12/22/2020 and was placed on lipid-lowering therapy guideline directed after intervention for aortic stenosis.  Compliance with diet, lifestyle and medications: Yes  She had an echocardiogram performed 01/16/2021.  TAVR normal appearance and function.  Left ventricle EF 60 to 65% right ventricle normal in size function and pulmonary artery pressure left atrium is moderately dilated moderate  mitral regurgitation mild to moderate tricuspid regurgitation were noted.  Study Result      ECHOCARDIOGRAM REPORT         Patient Name:   Denise Bailey Date of Exam: 01/16/2021  Medical Rec #:  [id number]           Height:       61.0 in  Accession #:    [id number]          Weight:       119.2 lb  Date of Birth:  02/13/1945            BSA:          1.516 m  Patient Age:    76 years            BP:           124/62 mmHg  Patient Gender: F                   HR:           75 bpm.  Exam Location:  [facility]   Procedure: 2D Echo, Cardiac Doppler, Color Doppler and Strain Analysis   Indications:    S/P TAVR (transcatheter aortic valve replacement) [V37.1                  (ICD-10-CM)]     History:        Patient has prior history of Echocardiogram examinations,  most  recent 09/12/2020. Atrial septal defect; Aortic Valve  Disease.                  Aortic Valve: CoreValve-Evolut Pro prosthetic, stented  (TAVR)                  valve is present in the aortic position.     Sonographer:    Luane School RDCS  Referring Phys: [id number] [place]     1. Left ventricular ejection fraction, by estimation, is 60 to 65%. The  left ventricle has normal function. The left ventricle has no regional  wall motion abnormalities. Left ventricular diastolic parameters are  consistent with Grade II diastolic  dysfunction (pseudonormalization).   2. Right ventricular systolic function is normal. The right ventricular  size is normal. There is normal pulmonary artery systolic pressure.   3. Left atrial size was moderately dilated.   4. The mitral valve is normal in structure. Moderate mitral valve  regurgitation. No evidence of mitral stenosis.   5. There is a CoreValve-Evolut Pro prosthetic (TAVR) valve present in the  aortic position. Satisfactory function.   6. The inferior vena cava is normal in size with greater than 50%  respiratory variability, suggesting  right atrial pressure of 3 mmHg.   7. Tricuspid valve regurgitation is mild to moderate.     She also had another echocardiogram performed 06/21/2021 was normal TAVR function She has had bilateral total hip arthroplasty with a good recovery She is pleased with the quality of her life and is having no exercise intolerance edema shortness of breath chest pain palpitation or syncope She has had no fever chills Past Medical History:  Diagnosis Date   Acute on chronic diastolic heart failure ([place]) 08/11/2019   Arthritis    ASD (atrial septal defect)    Atrial septal defect    Bilateral leg edema 03/15/2019   Breast cancer ([place])    Complication of anesthesia    patient reportd having difficult time waking up from anesthesia   History of breast cancer    s/p mastectomy    Hypothyroidism    S/P TAVR (transcatheter aortic valve replacement) 08/10/2019   s/p TAVR with a 26 mm Medtronic Evolut Pro + via the TF approach by Dr. Burt Knack and Roxy Manns    Severe aortic stenosis 03/15/2019   Venous insufficiency 04/12/2019    Past Surgical History:  Procedure Laterality Date   ABDOMINAL HYSTERECTOMY     ATRIAL SEPTAL DEFECT(ASD) CLOSURE N/A 09/24/2019   Procedure: ATRIAL SEPTAL DEFECT (ASD) CLOSURE;  Surgeon: [name], MD;  Location: [facility];  Service: Cardiovascular;  Laterality: N/A;   MASTECTOMY     RIGHT/LEFT HEART CATH AND CORONARY ANGIOGRAPHY N/A 06/09/2019   Procedure: RIGHT/LEFT HEART CATH AND CORONARY ANGIOGRAPHY;  Surgeon: [name], MD;  Location: [facility];  Service: Cardiovascular;  Laterality: N/A;   TEE WITHOUT CARDIOVERSION N/A 06/22/2019   Procedure: TRANSESOPHAGEAL ECHOCARDIOGRAM (TEE);  Surgeon: Geralynn Rile, MD;  Location: [facility];  Service: Cardiovascular;  Laterality: N/A;   TEE WITHOUT CARDIOVERSION N/A 08/10/2019   Procedure: TRANSESOPHAGEAL ECHOCARDIOGRAM (TEE);  Surgeon: [name], MD;  Location: [facility];  Service:  Open Heart Surgery;  Laterality: N/A;   TOTAL HIP ARTHROPLASTY Right 02/05/2021   TRANSCATHETER AORTIC VALVE REPLACEMENT, TRANSFEMORAL N/A 08/10/2019   Procedure: TRANSCATHETER AORTIC VALVE REPLACEMENT, TRANSFEMORAL;  Surgeon: [name], MD;  Location: [facility];  Service: Open Heart Surgery;  Laterality: N/A;    Current Medications: Current Meds  Medication Sig   amoxicillin (AMOXIL) 500 MG tablet Take 4 capsules (2,000 mg) one hour prior to all dental visits.   aspirin EC 81 MG tablet Take 1 tablet (81 mg total) by mouth daily. Swallow whole.   fluticasone (FLONASE) 50 MCG/ACT nasal spray Place 1 spray into both nostrils daily as needed for allergies or rhinitis.   levothyroxine (SYNTHROID) 88 MCG tablet Take 88 mcg by mouth daily before breakfast.    Pediatric Multivitamins-Iron (FLINTSTONES PLUS IRON PO) Take 1 tablet by mouth daily.   traMADol (ULTRAM) 50 MG tablet Take 50 mg by mouth 3 (three) times daily as needed for severe pain.      Allergies:   Patient has no known allergies.   Social History   Socioeconomic History   Marital status: Married    Spouse name: Not on file   Number of children: Not on file   Years of education: Not on file   Highest education level: Not on file  Occupational History   Not on file  Tobacco Use   Smoking status: Never   Smokeless tobacco: Never  Vaping Use   Vaping Use: Never used  Substance and Sexual Activity   Alcohol use: Never   Drug use: Never   Sexual activity: Not on file  Other Topics Concern   Not on file  Social History Narrative   Not on file   Social Determinants of Health   Financial Resource Strain: Not on file  Food Insecurity: Not on file  Transportation Needs: Not on file  Physical Activity: Not on file  Stress: Not on file  Social Connections: Not on file     Family History: The patient's  family history includes Diabetes in her brother; Stroke in her mother. ROS:   Please see the history  of present illness.    All other systems reviewed and are negative.  EKGs/Labs/Other Studies Reviewed:    The following studies were reviewed today:  EKG:  EKG ordered today and personally reviewed.  The ekg ordered today demonstrates sinus rhythm and normal  Recent Labs: 03/19/2021: ALT 9; BUN 15; Creatinine, Ser 0.80; Potassium 4.4; Sodium 143  Recent Lipid Panel    Component Value Date/Time   CHOL 185 03/19/2021 1321   TRIG 71 03/19/2021 1321   HDL 68 03/19/2021 1321   CHOLHDL 2.7 03/19/2021 1321   LDLCALC 104 (H) 03/19/2021 1321    Physical Exam:    VS:  BP 110/70 (BP Location: Right Arm, Patient Position: Sitting, Cuff Size: Normal)   Pulse (!) 57   Ht '5\' 1"'$  (1.549 m)   Wt 123 lb (55.8 kg)   SpO2 96%   BMI 23.24 kg/m     Wt Readings from Last 3 Encounters:  08/22/21 123 lb (55.8 kg)  12/22/20 119 lb 3.2 oz (54.1 kg)  04/27/20 118 lb 12.8 oz (53.9 kg)     GEN: She still has pallor of her skin without abnormality.  Well nourished, well developed in no acute distress HEENT: Normal NECK: No JVD; No carotid bruits LYMPHATICS: No lymphadenopathy CARDIAC: No murmurs RRR, no murmurs, rubs, gallops RESPIRATORY:  Clear to auscultation without rales, wheezing or rhonchi  ABDOMEN: Soft, non-tender, non-distended MUSCULOSKELETAL:  No edema; No deformity  SKIN: Warm and dry NEUROLOGIC:  Alert and oriented x 3 PSYCHIATRIC:  Normal affect    Signed, Shirlee More, MD  08/22/2021 11:35 AM    [facility]

## 2021-08-22 NOTE — Addendum Note (Signed)
Addended by: Edwyna Shell I on: 08/22/2021 11:57 AM   Modules accepted: Orders

## 2021-08-22 NOTE — Patient Instructions (Signed)
Medication Instructions:  Your physician has recommended you make the following change in your medication:   START: Pravastatin 20 mg daily  *If you need a refill on your cardiac medications before your next appointment, please call your pharmacy*   Lab Work: Your physician recommends that you return for lab work in:   Labs in 6 weeks: CBC, Lipid, CMP  If you have labs (blood work) drawn today and your tests are completely normal, you will receive your results only by: [place] (if you have [place]) OR A paper copy in the mail If you have any lab test that is abnormal or we need to change your treatment, we will call you to review the results.   Testing/Procedures: None   Follow-Up: At [facility], you and your health needs are our priority.  As part of our continuing mission to provide you with exceptional heart care, we have created designated Provider Care Teams.  These Care Teams include your primary Cardiologist (physician) and Advanced Practice Providers (APPs -  Physician Assistants and Nurse Practitioners) who all work together to provide you with the care you need, when you need it.  We recommend signing up for the patient portal called "MyChart".  Sign up information is provided on this After Visit Summary.  MyChart is used to connect with patients for Virtual Visits (Telemedicine).  Patients are able to view lab/test results, encounter notes, upcoming appointments, etc.  Non-urgent messages can be sent to your provider as well.   To learn more about what you can do with MyChart, go to [email or web address].    Your next appointment:   1 year(s)  The format for your next appointment:   In Person  Provider:   Shirlee More, MD    Other Instructions None  Important Information About Sugar

## 2021-09-06 IMAGING — US US HEPATIC LIVER DOPPLER
2 series · 13 of 25 positions shown · non-contrast
Comparison: CTA 06/14/2019 and previous

CLINICAL DATA: Hepatic cirrhosis suggested on recent CT; aortic
stenosis

EXAM:
DUPLEX ULTRASOUND OF LIVER
TECHNIQUE: Color and duplex Doppler ultrasound was performed to evaluate the
hepatic in-flow and out-flow vessels.

[Series 1: us hepatic liver doppler · 12 of 80 slices shown (1 of 2)]
[im 1/80]
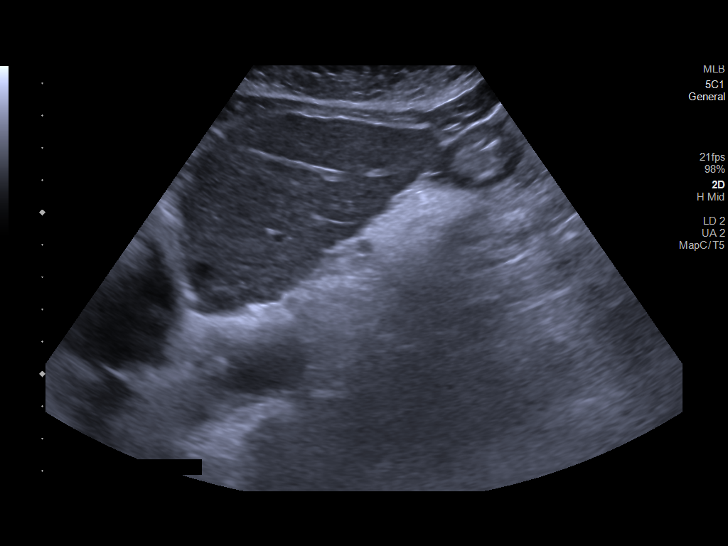
[im 7/80]
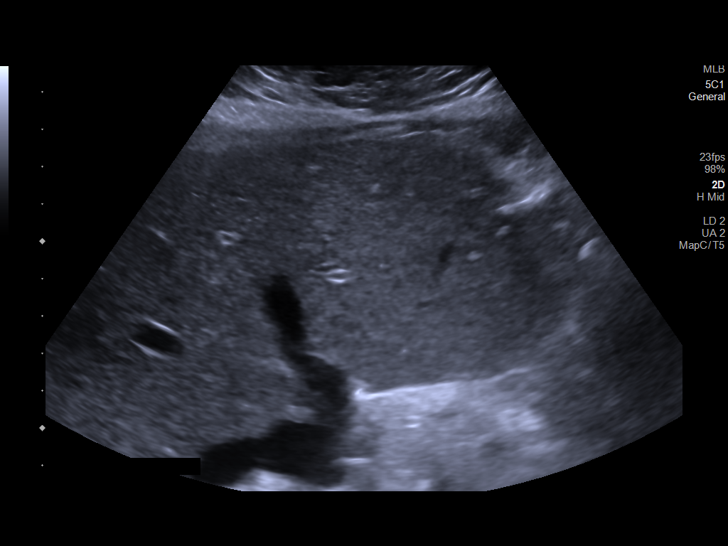
[im 14/80]
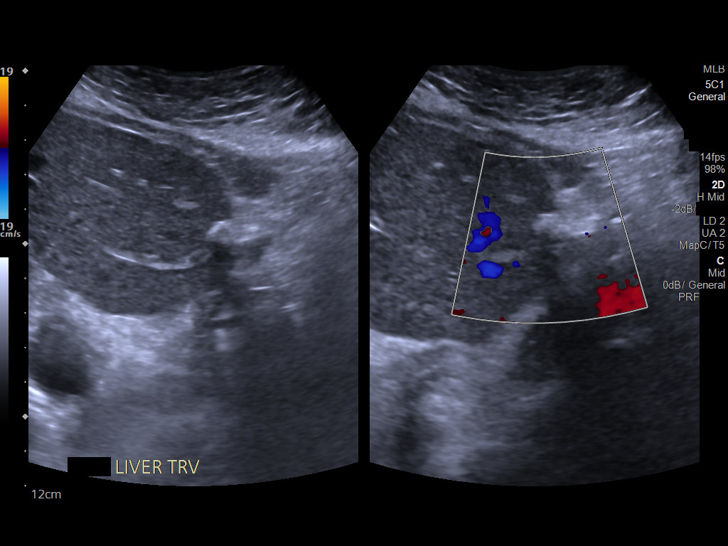
[im 21/80]
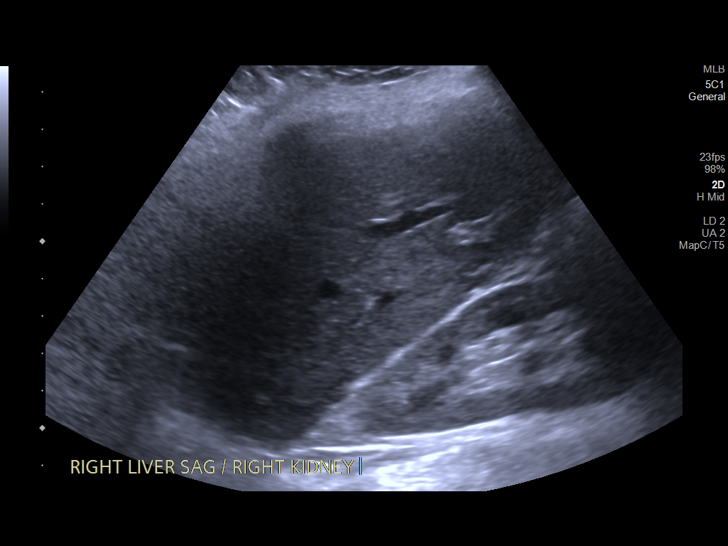
[im 28/80]
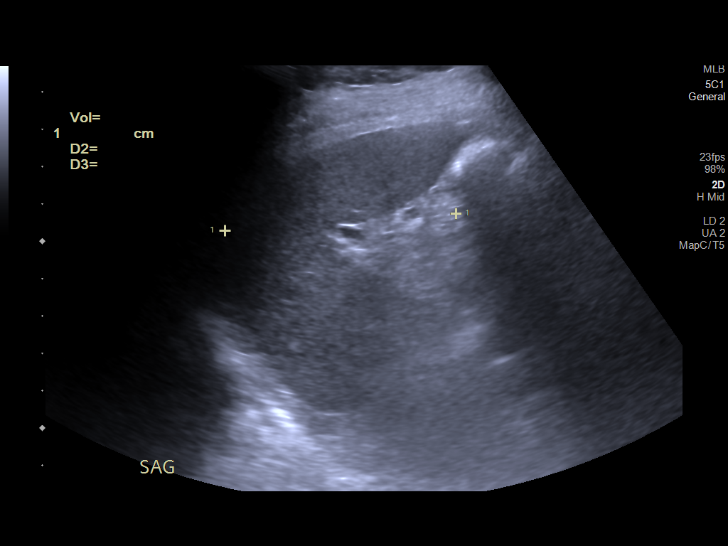
[im 35/80]
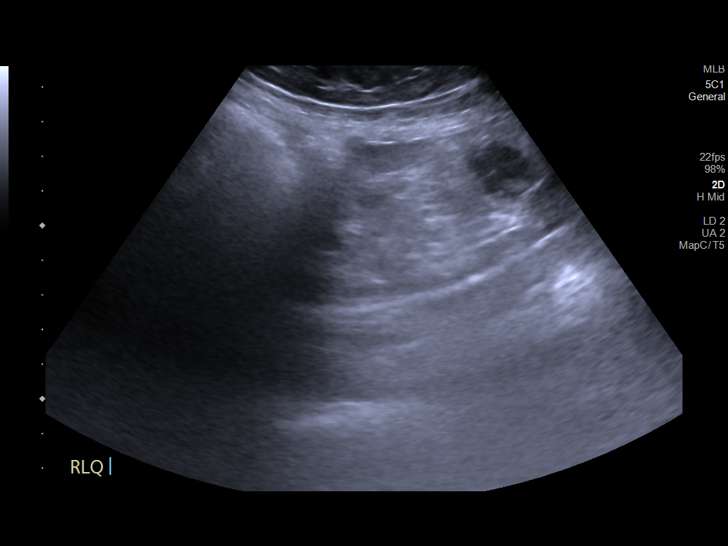
[im 42/80]
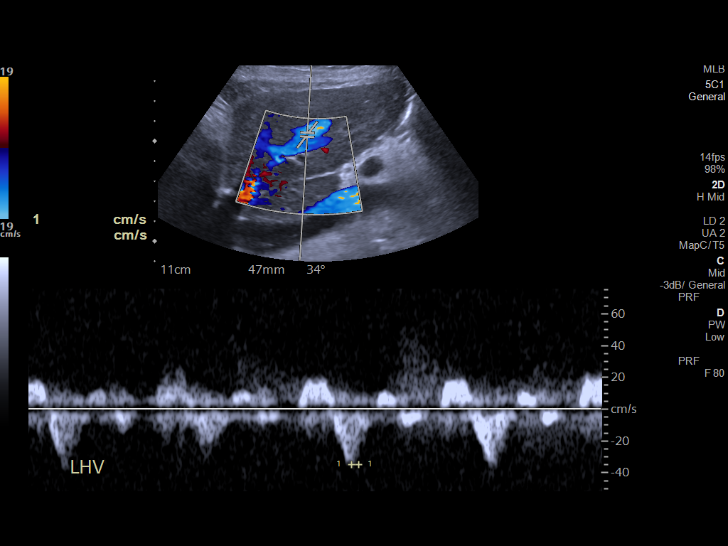
[im 49/80]
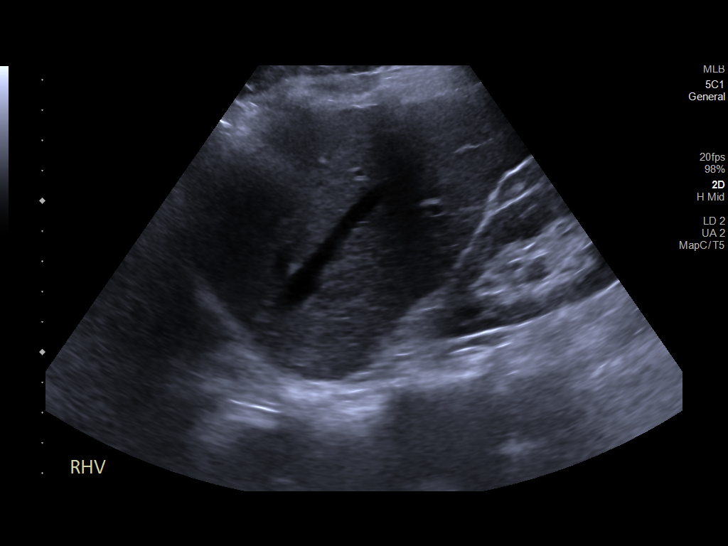
[im 55/80]
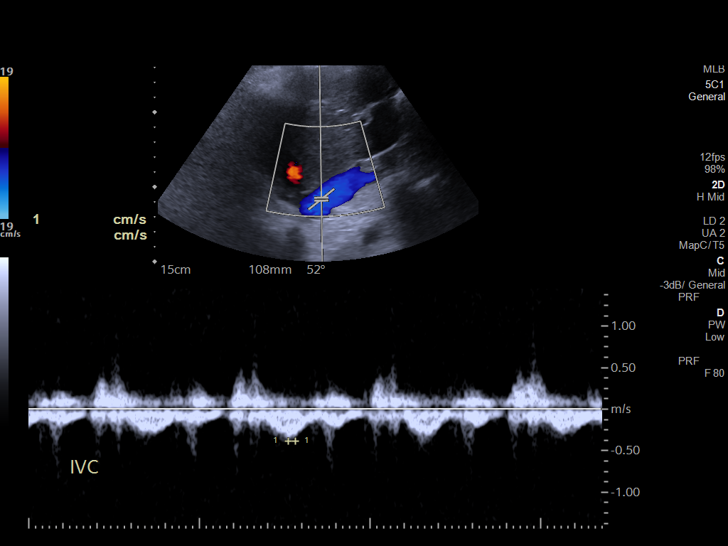
[im 62/80]
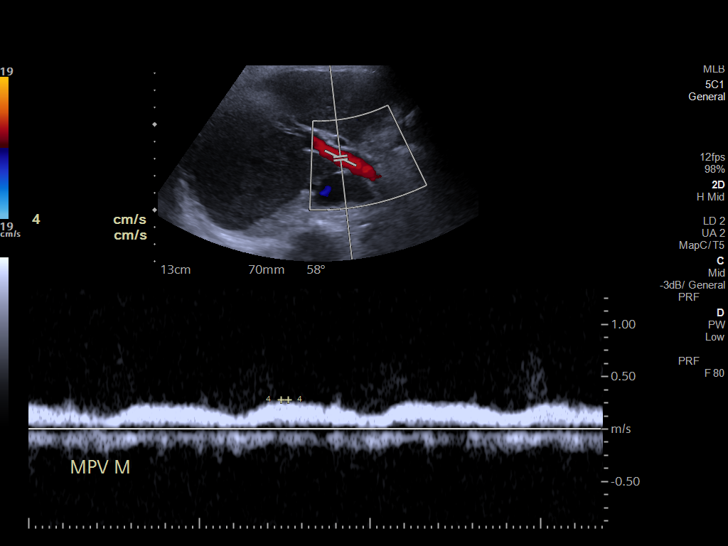
[im 69/80]
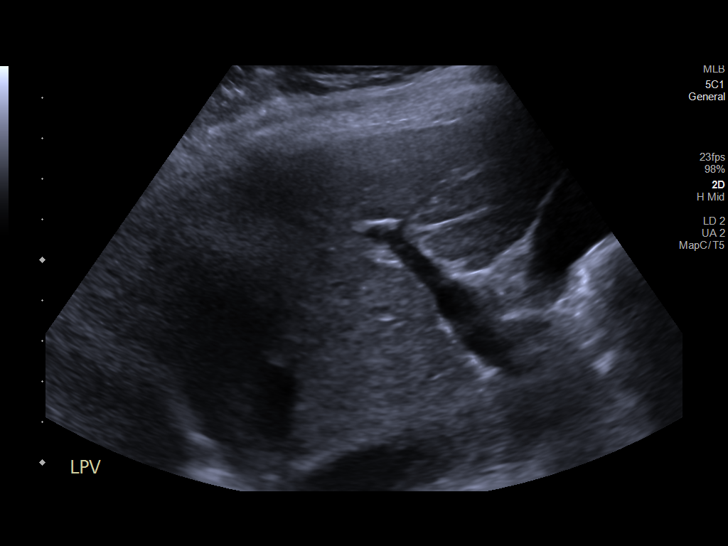
[im 76/80]
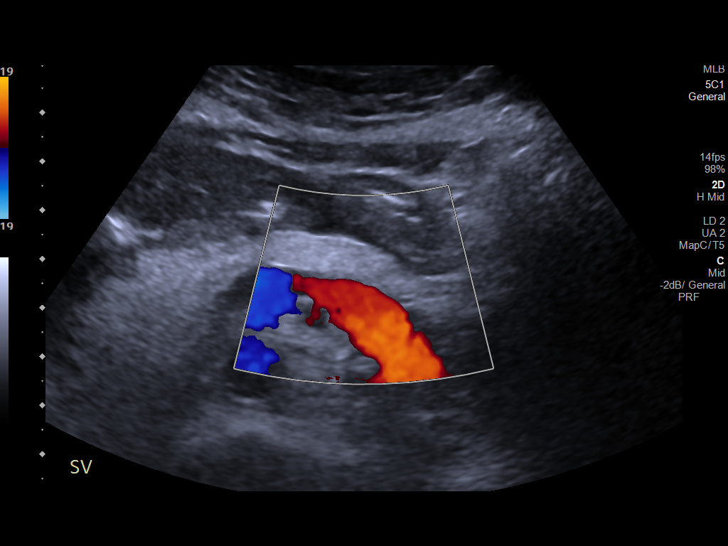

[Series 2: us hepatic liver doppler · 1 of 1 slices shown (2 of 2)]
[im 1/1]
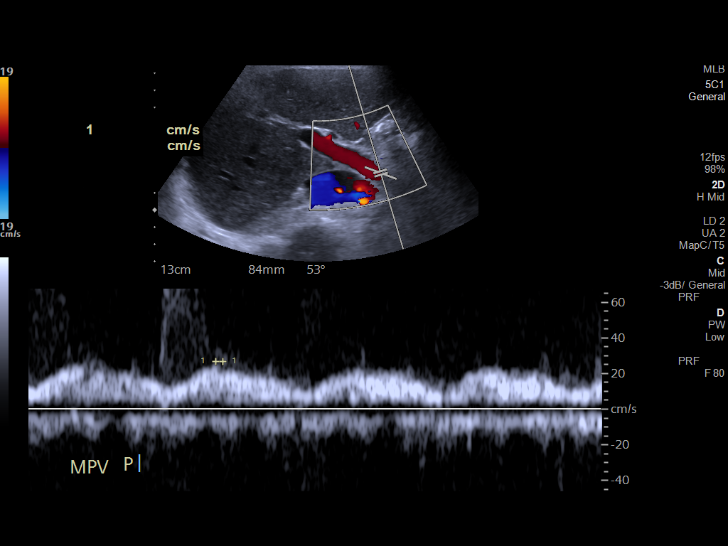

[13 of 25 positions shown; findings below may reference images not displayed]

FINDINGS: Liver: Mildly coarsened echotexture without focal lesion.
Micronodular capsular contour.

0.9 cm echogenic focus in the left lobe corresponding to heavily
calcified lesion seen on prior CT. No additional lesion or biliary
ductal dilatation.

Main Portal Vein size: 1.3 cm

Portal Vein Velocities (all hepatopetal):

Main Prox:  26 cm/sec

Main Mid: 27 cm/sec

Main Dist:  23 cm/sec
Right: 25 cm/sec
Left: 19 cm/sec

Hepatic Vein Velocities (all hepatofugal):

Right:  52 cm/sec

Middle:  42 cm/sec

Left:  49 cm/sec

IVC: Present and patent with normal respiratory phasicity. Velocity:
38 cm/sec

Hepatic Artery Velocity:  47 cm/sec

Splenic Vein Velocity:  25 cm/sec

Spleen: 6.2 cm x 8.6 cm x 2.9 cm with a total volume of 80.3 cm^3
(411 cm^3 is upper limit normal)

Portal Vein Occlusion/Thrombus: No

Splenic Vein Occlusion/Thrombus: No

Ascites: None

Varices: None seen
IMPRESSION: 1. Unremarkable hepatic vascular Doppler evaluation.
2. Nodular hepatic contour suggesting cirrhosis.

## 2021-10-04 ENCOUNTER — Telehealth: Payer: Self-pay

## 2021-10-04 LAB — LIPID PANEL
Chol/HDL Ratio: 3.2 ratio (ref 0.0–4.4)
Cholesterol, Total: 202 mg/dL — ABNORMAL HIGH (ref 100–199)
HDL: 63 mg/dL (ref >39)
LDL Chol Calc (NIH): 109 mg/dL — ABNORMAL HIGH (ref 0–99)
Triglycerides: 172 mg/dL — ABNORMAL HIGH (ref 0–149)
VLDL Cholesterol Cal: 30 mg/dL (ref 5–40)

## 2021-10-04 LAB — COMPREHENSIVE METABOLIC PANEL
ALT: 15 IU/L (ref 0–32)
AST: 22 IU/L (ref 0–40)
Albumin/Globulin Ratio: 1.4 (ref 1.2–2.2)
Albumin: 4.1 g/dL (ref 3.8–4.8)
Alkaline Phosphatase: 153 IU/L — ABNORMAL HIGH (ref 44–121)
BUN/Creatinine Ratio: 16 (ref 12–28)
BUN: 15 mg/dL (ref 8–27)
Bilirubin Total: 0.2 mg/dL (ref 0.0–1.2)
CO2: 23 mmol/L (ref 20–29)
Calcium: 9.3 mg/dL (ref 8.7–10.3)
Chloride: 105 mmol/L (ref 96–106)
Creatinine, Ser: 0.96 mg/dL (ref 0.57–1.00)
Globulin, Total: 2.9 g/dL (ref 1.5–4.5)
Glucose: 81 mg/dL (ref 70–99)
Potassium: 4.4 mmol/L (ref 3.5–5.2)
Sodium: 144 mmol/L (ref 134–144)
Total Protein: 7 g/dL (ref 6.0–8.5)
eGFR: 61 mL/min/{1.73_m2} (ref >59)

## 2021-10-04 LAB — CBC
Hematocrit: 38 % (ref 34.0–46.6)
Hemoglobin: 12.3 g/dL (ref 11.1–15.9)
MCH: 29.9 pg (ref 26.6–33.0)
MCHC: 32.4 g/dL (ref 31.5–35.7)
MCV: 93 fL (ref 79–97)
Platelets: 140 10*3/uL — ABNORMAL LOW (ref 150–450)
RBC: 4.11 x10E6/uL (ref 3.77–5.28)
RDW: 13.2 % (ref 11.7–15.4)
WBC: 4.8 10*3/uL (ref 3.4–10.8)

## 2021-10-04 NOTE — Telephone Encounter (Signed)
Patient notified of results.

## 2021-10-04 NOTE — Telephone Encounter (Signed)
-----   Message from Richardo Priest, MD sent at 10/04/2021  7:50 AM EDT ----- Continue pravastatin.

## 2021-10-29 ENCOUNTER — Telehealth: Payer: Self-pay | Admitting: *Deleted

## 2021-10-29 MED ORDER — AMOXICILLIN 500 MG PO TABS: ORAL_TABLET | ORAL | 3 refills | Status: AC

## 2021-10-29 NOTE — Telephone Encounter (Signed)
   Primary Cardiologist: Berniece Salines, DO   SBE prophylaxis is required for the patient. Amoxicillin Rx was sent to patient's pharmacy.   I will route this recommendation to the requesting party via Epic fax function and remove from pre-op pool.  Please call with questions.  Emmaline Life, NP-C  10/29/2021, 3:12 PM [address] Office [phone] Fax [phone]

## 2021-10-29 NOTE — Telephone Encounter (Signed)
   Pre-operative Risk Assessment    Patient Name: Denise Bailey  DOB: 02/15/1945 MRN: [id number]     Request for Surgical Clearance    Procedure:  Dental Extraction - Amount of Teeth to be Pulled:  NONE TO BE PULLED, PT HAVING GENERAL CLEANING AND 1 FILLING.. CALLED DENTIST OFFICE, THEY ARE NOT ASKING FOR CLEARANCE, THEY JUST NEED TO SEE IF PT NEEDS PREMED?  Date of Surgery:  Clearance TBD                                 Surgeon:  Daria Pastures, DMD Surgeon's Group or Practice Name:  Daria Pastures, DMD Phone number:  [phone] Fax number:  [phone]   Type of Clearance Requested:   - Medical  DOES PT NEED PREMED   Type of Anesthesia:  Not Indicated   Additional requests/questions:    Signed, Jeanann Lewandowsky   10/29/2021, 2:57 PM

## 2022-08-21 NOTE — Progress Notes (Unsigned)
Cardiology Office Note:    Date:  08/22/2022   ID:  Denise Bailey, DOB 1945-12-12, MRN [id number]  PCP:  Denise Like, NP  Cardiologist:  Denise Herrlich, MD    Referring MD: Denise Councilman, FNP    ASSESSMENT:    1. S/P TAVR (transcatheter aortic valve replacement)   2. Hypertensive heart disease with heart failure (HCC)   3. Atrial septal defect   4. Status post device closure of ASD   5. Pure hypercholesterolemia   6. Other iron deficiency anemia    PLAN:    In order of problems listed above:  Denise Bailey continues to do well following TAVR will recheck an echocardiogram monitor plan to see her in 1 year with an echocardiogram before that visit also Very well compensated blood pressure is at target not requiring a diuretic Recheck echocardiogram no indication she has residual shunt Continue her current statin labs are followed with her PCP Stable anemia last hemoglobin   Next appointment: I will plan to see her in 1 year in the office   Medication Adjustments/Labs and Tests Ordered: Current medicines are reviewed at length with the patient today.  Concerns regarding medicines are outlined above.  No orders of the defined types were placed in this encounter.  No orders of the defined types were placed in this encounter.    History of Present Illness:    Denise Bailey is a 77 y.o. female with a hx of severe symptomatic aortic stenosis with TAVR 08/18/2019 and ASD repair with a 14 mm Amplatzer device hypertension hyperlipidemia iron deficiency anemia tricuspid regurgitation last seen 08/22/2021. Compliance with diet, lifestyle and medications: Yes  She is a little overdue for follow-up but has done well in the interim she has had bilateral total hip arthroplasty 2 weeks ago she had a respiratory infection no fever cough and had some pleuritic discomfort she is recovered No edema shortness of breath orthopnea angina palpitation or syncope Labs 05/28/2022  cholesterol 211 LDL 131 hemoglobin 1312.3 creatinine 0.92 potassium 4.3  Past Medical History:  Diagnosis Date   Acute on chronic diastolic heart failure (HCC) 08/11/2019   Arthritis    ASD (atrial septal defect)    Atrial septal defect    Bilateral leg edema 03/15/2019   Breast cancer (HCC)    Chronic diastolic CHF (congestive heart failure) (HCC) 10/28/2019   Complication of anesthesia    patient reportd having difficult time waking up from anesthesia   History of breast cancer    s/p mastectomy    Hypothyroidism    Leg edema 10/28/2019   S/P TAVR (transcatheter aortic valve replacement) 08/10/2019   s/p TAVR with a 26 mm Medtronic Evolut Pro + via the TF approach by Dr. Excell Seltzer and Cornelius Moras    Secundum ASD 10/28/2019   Severe aortic stenosis 03/15/2019   Status post device closure of ASD 01/28/2020   Venous insufficiency 04/12/2019    Current Medications: Current Meds  Medication Sig   amoxicillin (AMOXIL) 500 MG tablet Take 4 capsules (2,000 mg) one hour prior to all dental visits.   aspirin EC 81 MG tablet Take 1 tablet (81 mg total) by mouth daily. Swallow whole.   fluticasone (FLONASE) 50 MCG/ACT nasal spray Place 1 spray into both nostrils daily as needed for allergies or rhinitis.   levothyroxine (SYNTHROID) 75 MCG tablet Take 75 mcg by mouth daily.   Pediatric Multivitamins-Iron (FLINTSTONES PLUS IRON PO) Take 1 tablet by mouth daily.   pravastatin (PRAVACHOL) 20  MG tablet Take 1 tablet (20 mg total) by mouth every evening.   traMADol (ULTRAM) 50 MG tablet Take 50 mg by mouth 3 (three) times daily as needed for severe pain.       EKGs/Labs/Other Studies Reviewed:    The following studies were reviewed today:  Cardiac Studies & Procedures   CARDIAC CATHETERIZATION  CARDIAC CATHETERIZATION 09/24/2019  Narrative Successful transcatheter closure of an ostium secundum ASD using a 14 mm Amplatzer atrial septal occluder device under fluoroscopic and intracardiac echo  guidance  Recommend:  Aspirin and clopidogrel without interruption x6 months  SBE prophylaxis x6 months  Limited 2D echo prior to discharge this afternoon   CARDIAC CATHETERIZATION  CARDIAC CATHETERIZATION 06/09/2019  Narrative 1.  Angiographically normal coronary arteries with no evidence of coronary stenosis 2.  Severe calcific aortic stenosis with a calcified, restricted aortic valve on plain fluoroscopy and a mean pressure gradient of 38 mmHg, peak to peak gradient 43 mmHg, calculated valve area 1 cm 3.  Normal right heart pressures, normal LVEDP 4.  Oxygen saturation run suggestive of left to right shunt with QP/QS of 1.7, consider arteriovenous mixing below the level of the IVC.  Recommend: Continued multidisciplinary heart team evaluation for treatment of severe aortic stenosis, CTA of the chest, abdomen, and pelvis both for planning of aortic valve intervention and also evaluation of left to right shunting with possibility of shunting at the abdominal level.  Findings Coronary Findings Diagnostic  Dominance: Right  Left Main The left main is short.  It divides into the LAD and left circumflex.  There is no stenosis present.  Left Anterior Descending Vessel is angiographically normal. The LAD proper is small in caliber.  The first diagonal branch supplies a larger territory and neither branch has evidence of any angiographic disease.  The vessels are widely patent with no stenosis.  Left Circumflex Vessel is angiographically normal. The circumflex is patent with no stenosis.  Right Coronary Artery The RCA is dominant.  The vessel has no stenosis.  The PDA is patent.  Intervention  No interventions have been documented.     ECHOCARDIOGRAM  ECHOCARDIOGRAM COMPLETE 06/21/2021  Narrative ECHOCARDIOGRAM REPORT    Patient Name:   VAYAH BLOT Date of Exam: 06/21/2021 Medical Rec #:  [id number]           Height:       61.0 in Accession #:    [id number]           Weight:       119.2 lb Date of Birth:  08-May-1945            BSA:          1.516 m Patient Age:    76 years            BP:           124/62 mmHg Patient Gender: F                   HR:           61 bpm. Exam Location:  [facility]  Procedure: 2D Echo, Cardiac Doppler, Color Doppler and Strain Analysis  Indications:    S/P TAVR (transcatheter aortic valve replacement) [V95.6 (ICD-10-CM)]  History:        Patient has prior history of Echocardiogram examinations, most recent 01/16/2021. Secundum ASD, Status post device closure, Severe aortic stenosis; Signs/Symptoms:Bilateral leg edema. Aortic Valve: 26 mm Medtronic stented (TAVR) valve is present in the aortic  position. Procedure Date: Denise Bailey 20th, 2021.  Sonographer:    Louie Boston RDCS Referring Phys: [id number] Wille Celeste THOMPSON  IMPRESSIONS   1. Left ventricular ejection fraction, by estimation, is 60 to 65%. The left ventricle has normal function. The left ventricle has no regional wall motion abnormalities. Left ventricular diastolic parameters are consistent with Grade II diastolic dysfunction (pseudonormalization). 2. Right ventricular systolic function is normal. The right ventricular size is normal. There is normal pulmonary artery systolic pressure. 3. Left atrial size was mild to moderately dilated. 4. S/P 14 mm Amplatz closure device for ASD. 5. The mitral valve is normal in structure. Mild mitral valve regurgitation. No evidence of mitral stenosis. 6. Tricuspid valve regurgitation is moderate. 7. The aortic valve has been repaired/replaced. Aortic valve regurgitation is not visualized. No aortic stenosis is present. There is a 26 mm Medtronic stented (TAVR) valve present in the aortic position. Procedure Date: Denise Bailey 20th, 2021. Echo findings are consistent with normal structure and function of the aortic valve prosthesis. Aortic valve mean gradient measures 6.0 mmHg. 8. The inferior vena cava is normal in size with greater than 50%  respiratory variability, suggesting right atrial pressure of 3 mmHg.  FINDINGS Left Ventricle: Left ventricular ejection fraction, by estimation, is 60 to 65%. The left ventricle has normal function. The left ventricle has no regional wall motion abnormalities. The left ventricular internal cavity size was normal in size. There is no left ventricular hypertrophy. Left ventricular diastolic parameters are consistent with Grade II diastolic dysfunction (pseudonormalization).  Right Ventricle: The right ventricular size is normal. No increase in right ventricular wall thickness. Right ventricular systolic function is normal. There is normal pulmonary artery systolic pressure. The tricuspid regurgitant velocity is 2.65 m/s, and with an assumed right atrial pressure of 3 mmHg, the estimated right ventricular systolic pressure is 31.1 mmHg.  Left Atrium: Left atrial size was mild to moderately dilated.  Right Atrium: Right atrial size was normal in size.  Pericardium: There is no evidence of pericardial effusion.  Mitral Valve: The mitral valve is normal in structure. Mild mitral valve regurgitation. No evidence of mitral valve stenosis.  Tricuspid Valve: The tricuspid valve is normal in structure. Tricuspid valve regurgitation is moderate . No evidence of tricuspid stenosis.  Aortic Valve: The aortic valve has been repaired/replaced. Aortic valve regurgitation is not visualized. No aortic stenosis is present. Aortic valve mean gradient measures 6.0 mmHg. Aortic valve peak gradient measures 11.6 mmHg. Aortic valve area, by VTI measures 1.27 cm. There is a 26 mm Medtronic stented (TAVR) valve present in the aortic position. Procedure Date: Denise Bailey 20th, 2021. Echo findings are consistent with normal structure and function of the aortic valve prosthesis.  Pulmonic Valve: The pulmonic valve was normal in structure. Pulmonic valve regurgitation is not visualized. No evidence of pulmonic stenosis.  Aorta:  The aortic root is normal in size and structure.  Venous: The inferior vena cava is normal in size with greater than 50% respiratory variability, suggesting right atrial pressure of 3 mmHg.  IAS/Shunts: No atrial level shunt detected by color flow Doppler.   LEFT VENTRICLE PLAX 2D LVIDd:         3.80 cm   Diastology LVIDs:         2.70 cm   LV e' medial:    5.66 cm/s LV PW:         0.90 cm   LV E/e' medial:  17.7 LV IVS:        0.90 cm  LV e' lateral:   8.81 cm/s LVOT diam:     1.80 cm   LV E/e' lateral: 11.4 LV SV:         48 LV SV Index:   32 LVOT Area:     2.54 cm   RIGHT VENTRICLE             IVC RV S prime:     10.00 cm/s  IVC diam: 1.30 cm TAPSE (M-mode): 3.1 cm  LEFT ATRIUM             Index        RIGHT ATRIUM           Index LA diam:        3.20 cm 2.11 cm/m   RA Area:     15.20 cm LA Vol (A2C):   66.9 ml 44.13 ml/m  RA Volume:   35.40 ml  23.35 ml/m LA Vol (A4C):   39.6 ml 26.12 ml/m LA Biplane Vol: 52.0 ml 34.30 ml/m AORTIC VALVE AV Area (Vmax):    1.16 cm AV Area (Vmean):   1.31 cm AV Area (VTI):     1.27 cm AV Vmax:           170.00 cm/s AV Vmean:          109.000 cm/s AV VTI:            0.378 m AV Peak Grad:      11.6 mmHg AV Mean Grad:      6.0 mmHg LVOT Vmax:         77.70 cm/s LVOT Vmean:        56.000 cm/s LVOT VTI:          0.188 m LVOT/AV VTI ratio: 0.50  AORTA Ao Root diam: 2.20 cm Ao Asc diam:  2.50 cm Ao Desc diam: 1.60 cm  MITRAL VALVE                TRICUSPID VALVE MV Area (PHT): 3.42 cm     TR Peak grad:   28.1 mmHg MV Decel Time: 222 msec     TR Vmax:        265.00 cm/s MV E velocity: 100.00 cm/s MV A velocity: 61.30 cm/s   SHUNTS MV E/A ratio:  1.63         Systemic VTI:  0.19 m Systemic Diam: 1.80 cm  Gypsy Balsam MD Electronically signed by Gypsy Balsam MD Signature Date/Time: 06/21/2021/8:34:32 PM    Final   TEE  ECHO TEE 06/22/2019  Narrative TRANSESOPHOGEAL ECHO REPORT    Patient Name:   Noel Gerold Date of Exam: 06/22/2019 Medical Rec #:  [id number]           Height:       61.0 in Accession #:    [id number]          Weight:       113.0 lb Date of Birth:  [birth year]            BSA:          1.482 m Patient Age:    74 years            BP:           128/70 mmHg Patient Gender: F                   HR:           107 bpm. Exam Location:  Inpatient  Procedure: Transesophageal Echo, 3D Echo, Color Doppler and Cardiac Doppler  Indications:     Q21.1 ASD  History:         Patient has prior history of Echocardiogram examinations, most recent 03/02/2019.  Sonographer:     Irving Burton Senior RDCS Referring Phys:  [id number] Wille Celeste THOMPSON Diagnosing Phys: Lennie Odor MD  PROCEDURE: After discussion of the risks and benefits of a TEE, an informed consent was obtained from the patient. TEE procedure time was 28 minutes. The transesophogeal probe was passed without difficulty through the esophogus of the patient. Imaged were obtained with the patient in a left lateral decubitus position. Local oropharyngeal anesthetic was provided with Cetacaine. Sedation performed by different physician. The patient was monitored while under deep sedation. Anesthestetic sedation was provided intravenously by Anesthesiology: 230mg  of Propofol, 40mg  of Lidocaine. Image quality was excellent. The patient's vital signs; including heart rate, blood pressure, and oxygen saturation; remained stable throughout the procedure. The patient developed no complications during the procedure.  IMPRESSIONS   1. A large secundum ASD is present in the anterior/superior aspect of the IAS with L to R shunting. It measures 1.1 cm x 0.8 cm (0.64 cm2) by direct 3D MPR assessment. SVC rim: 1.6 cm; IVC rim 1.6 cm; aortic rim 0.6 cm; posterior rim 1.1 cm; posterior superior rim 1.4 cm; atrioventricular rim 2.1 cm. All rims are sufficient for percutanous closure. Evidence of atrial level shunting detected by color flow Doppler. There is  a large secundum atrial septal defect with predominantly left to right shunting across the atrial septum. 2. Sievers type bicuspid aortic valve with fusion of the RCC/LCC with raphe. V max 4.4 m/s, mean gradient 42 mmHG, AVA by VTI 0.30 cm2. Direct planimetry by 3D MPR 0.60 cm2. Mild AI with 3D ERO 0.08 cm2. The aortic valve is bicuspid. Aortic valve regurgitation is mild. Severe aortic valve stenosis. Aortic valve area, by VTI measures 0.30 cm. Aortic valve mean gradient measures 42.0 mmHg. Aortic valve Vmax measures 4.44 m/s. 3. Left ventricular ejection fraction, by estimation, is 60 to 65%. The left ventricle has normal function. The left ventricle has no regional wall motion abnormalities. There is the interventricular septum is flattened in systole and diastole, consistent with right ventricular pressure and volume overload. 4. Right ventricular systolic function is mildly reduced. The right ventricular size is severely enlarged. 5. Left atrial size was mildly dilated. No left atrial/left atrial appendage thrombus was detected. The LAA emptying velocity was 67 cm/s. 6. Right atrial size was severely dilated. 7. The mitral valve is grossly normal. Mild to moderate mitral valve regurgitation. 8. Moderate tricuspid regurgitation. 2D PISA 0.66 cm. 2D VC 0.5 cm. 2D ERO 0.29 cm2 with R vol 34 cc. 3D ERO 0.30 cm2 with R vol 33 cc. Tricuspid valve regurgitation is moderate. 9. There is mild (Grade II) layered plaque involving the ascending aorta.  FINDINGS Left Ventricle: Left ventricular ejection fraction, by estimation, is 60 to 65%. The left ventricle has normal function. The left ventricle has no regional wall motion abnormalities. The left ventricular internal cavity size was normal in size. There is no left ventricular hypertrophy. The interventricular septum is flattened in systole and diastole, consistent with right ventricular pressure and volume overload.  Right Ventricle: The right  ventricular size is severely enlarged. No increase in right ventricular wall thickness. Right ventricular systolic function is mildly reduced.  Left Atrium: Left atrial size was mildly dilated. No left atrial/left atrial appendage thrombus was detected.  The LAA emptying velocity was 67 cm/s.  Right Atrium: Right atrial size was severely dilated.  Pericardium: Trivial pericardial effusion is present.  Mitral Valve: The mitral valve is grossly normal. Mild to moderate mitral valve regurgitation.  Tricuspid Valve: Moderate tricuspid regurgitation. 2D PISA 0.66 cm. 2D VC 0.5 cm. 2D ERO 0.29 cm2 with R vol 34 cc. 3D ERO 0.30 cm2 with R vol 33 cc. The tricuspid valve is grossly normal. Tricuspid valve regurgitation is moderate . No evidence of tricuspid stenosis.  Aortic Valve: Sievers type bicuspid aortic valve with fusion of the RCC/LCC with raphe. V max 4.4 m/s, mean gradient 42 mmHG, AVA by VTI 0.30 cm2. Direct planimetry by 3D MPR 0.60 cm2. Mild AI with 3D ERO 0.08 cm2. The aortic valve is bicuspid. Aortic valve regurgitation is mild. Severe aortic stenosis is present. Aortic valve mean gradient measures 42.0 mmHg. Aortic valve peak gradient measures 78.9 mmHg. Aortic valve area, by VTI measures 0.30 cm.  Pulmonic Valve: The pulmonic valve was grossly normal. Pulmonic valve regurgitation is trivial. No evidence of pulmonic stenosis.  Aorta: The aortic root and ascending aorta are structurally normal, with no evidence of dilitation. There is mild (Grade II) layered plaque involving the ascending aorta.  Venous: The left upper pulmonary vein, left lower pulmonary vein, right upper pulmonary vein and right lower pulmonary vein are normal.  IAS/Shunts: Evidence of atrial level shunting detected by color flow Doppler. There is a large secundum atrial septal defect with predominantly left to right shunting across the atrial septum. A large secundum ASD is present in the anterior/superior aspect of  the IAS with L to R shunting. It measures 1.1 cm x 0.8 cm (0.64 cm2) by direct 3D MPR assessment. SVC rim: 1.6 cm; IVC rim 1.6 cm; aortic rim 0.6 cm; posterior rim 1.1 cm; posterior superior rim 1.4 cm; atrioventricular rim 2.1 cm. All rims are sufficient for percutanous closure.   LEFT VENTRICLE PLAX 2D LVOT diam:     1.70 cm LV SV:         36 LV SV Index:   25 LVOT Area:     2.27 cm   RIGHT VENTRICLE RV Basal diam:  4.70 cm  AORTIC VALVE AV Area (Vmax):    0.36 cm AV Area (Vmean):   0.44 cm AV Area (VTI):     0.30 cm AV Vmax:           444.00 cm/s AV Vmean:          303.000 cm/s AV VTI:            1.230 m AV Peak Grad:      78.9 mmHg AV Mean Grad:      42.0 mmHg LVOT Vmax:         71.10 cm/s LVOT Vmean:        58.400 cm/s LVOT VTI:          0.160 m LVOT/AV VTI ratio: 0.13  AORTA Ao Root diam: 3.10 cm Ao Arch diam: 2.7 cm  TRICUSPID VALVE TR Peak grad:   54.5 mmHg TR Mean grad:   34.0 mmHg TR Vmax:        369.00 cm/s TR Vmean:       279.0 cm/s  SHUNTS Systemic VTI:  0.16 m Systemic Diam: 1.70 cm  Lennie Odor MD Electronically signed by Lennie Odor MD Signature Date/Time: 06/22/2019/2:29:25 PM    Final    CT SCANS  CT CORONARY MORPH W/CTA COR W/SCORE 06/14/2019  Addendum 06/14/2019  6:12 PM ADDENDUM REPORT: 06/14/2019 18:10  CLINICAL DATA:  Severe Aortic Stenosis.  EXAM: Cardiac TAVR CT  TECHNIQUE: The patient was scanned on a [employer]. A 120 kV retrospective scan was triggered in the descending thoracic aorta at 111 HU's. Gantry rotation speed was 250 msecs and collimation was .6 mm. No beta blockade or nitro were given. The 3D data set was reconstructed in 5% intervals of the R-R cycle. Systolic and diastolic phases were analyzed on a dedicated work station using MPR, MIP and VRT modes. The patient received 80 cc of contrast.  FINDINGS: Image quality: Excellent.  Noise artifact is: Limited.  Valve Morphology: The aortic  valve is bicuspid with fusion of the RCC/LCC (Sievers type 1, raphe type). There is moderate calcification of the RCC/LCC with severe thickening. There is no significant raphe calcification. There is severely restricted movement of the valve leaflets in systole.  Aortic Valve area: 0.71 cm2  Aortic Valve Calcium score: 1590  Aortic annular dimension:  Phase assessed: 30%  Annular area: 336 mm2  Annular perimeter: 66.2 mm  Max diameter: 23.3 mm  Min diameter: 18.7 mm  Annular and subannular calcification: No significant annular or subannular calcification.  Optimal coplanar projection: RAO 5 CRA 1  Coronary Artery Height above Annulus:  Left Main: 12.2 mm  Right Coronary: 15.5 mm  Sinus of Valsalva Measurements (Bicuspid Valve):  Commissure to Commissure: 23 mm.  Maximum Diameter: 29 mm.  Sinus of Valsalva Height:  Non-coronary: 23 mm  Right-coronary: 19 mm  Left-coronary: 18 mm  Sinotubular Junction: 23 mm.  Mild calcified plaque that is layered.  Ascending Thoracic Aorta: 27 mm.  No significant calcifications.  Coronary Arteries: Normal coronary origin. Right dominance. The study was performed without use of NTG and is insufficient for plaque evaluation. Please refer to recent cardiac cath for coronary assessment.  Cardiac Morphology:  Right Atrium: Right atrial size is severely dilated. There is contrast reflux into the IVC consistent with elevated RA pressure.  Right Ventricle: The right ventricular cavity is severely dilated with mildy reduced function.  Tricuspid Valve: There is incomplete coaptation of the tricuspid valve consistent with at least moderate regurgitation. The valve leaflets appear normal.  Left Atrium: Left atrial size is dilated with no left atrial appendage filling defect. The IAS bows toward the RA consistent with increased LA pressure. There is a large secundum ASD present in the IAS that measures 10 mm x 12 mm with L to  R shunting. The aortic rim measures 5.4 mm. The posterior rim measures 17 mm. The SVC rim measures 22 mm. The IVC rim measures 23 mm. The superior rim measures 22 mm and the atrioventricular rim measures 28 mm.  Left Ventricle: The ventricular cavity size is within normal limits. There are no stigmata of prior infarction. There is no abnormal filling defect. The septum is flattened in systole/diastole consistent with RV volume/pressure overload. LVEF=79%.  Pulmonary arteries: Dilated consistent with pulmonary hypertension.  Pulmonary veins: Normal pulmonary venous drainage.  Pericardium: Normal thickness with no significant effusion or calcium present.  Mitral Valve: The mitral valve is normal structure without significant calcification.  Extra-cardiac findings: See attached radiology report for non-cardiac structures.  IMPRESSION: 1. Bicuspid Aortic Valve with fusion of the RCC/LCC (Sievers type 1 with raphe).  2. Small annulus noted (336 mm2). Appropriate for 26 mm Medtronic Evolut TAVR.  3. No significant annular or subannular calcification.  4. Sufficient coronary to annulus distance.  5. Optimal Fluoroscopic  Angle for Delivery: RAO 5 CRA 1  6. Large secundum type ASD (10 mm x 12 mm) with rim measurements for ASD closure described above. L to R shunting noted.  7. Severely dilated RA/RV and flattened septum consistent with RV volume/pressure overload.  8. Dilated pulmonary artery consistent with pulmonary hypertension.  Gerri Spore T. Flora Lipps, MD   Electronically Signed By: Lennie Odor On: 06/14/2019 18:10  Narrative EXAM: OVER-READ INTERPRETATION  CT CHEST  The following report is an over-read performed by radiologist Dr. Trudie Reed of [facility] on 06/14/2019. This over-read does not include interpretation of cardiac or coronary anatomy or pathology. The coronary calcium score/coronary CTA interpretation by the cardiologist is  attached.  COMPARISON:  None.  FINDINGS: Extracardiac findings will be described separately under dictation for contemporaneously obtained CTA chest, abdomen and pelvis.  IMPRESSION: Please see separate dictation for contemporaneously obtained CTA chest, abdomen and pelvis dated 06/14/2019 for full description of relevant extracardiac findings.  Electronically Signed: By: Trudie Reed M.D. On: 06/14/2019 11:45              Recent Labs: 10/03/2021: ALT 15; BUN 15; Creatinine, Ser 0.96; Hemoglobin 12.3; Platelets 140; Potassium 4.4; Sodium 144  Recent Lipid Panel    Component Value Date/Time   CHOL 202 (H) 10/03/2021 1403   TRIG 172 (H) 10/03/2021 1403   HDL 63 10/03/2021 1403   CHOLHDL 3.2 10/03/2021 1403   LDLCALC 109 (H) 10/03/2021 1403    Physical Exam:    VS:  BP 120/60   Pulse 66   Ht 5' 1.6" (1.565 m)   Wt 123 lb 12.8 oz (56.2 kg)   SpO2 96%   BMI 22.94 kg/m     Wt Readings from Last 3 Encounters:  08/22/22 123 lb 12.8 oz (56.2 kg)  08/22/21 123 lb (55.8 kg)  12/22/20 119 lb 3.2 oz (54.1 kg)     GEN:  Well nourished, well developed in no acute distress HEENT: Normal NECK: No JVD; No carotid bruits LYMPHATICS: No lymphadenopathy CARDIAC: RRR, no murmurs, rubs, gallops on my exam for AVR RESPIRATORY:  Clear to auscultation without rales, wheezing or rhonchi  ABDOMEN: Soft, non-tender, non-distended MUSCULOSKELETAL:  No edema; No deformity  SKIN: Warm and dry NEUROLOGIC:  Alert and oriented x 3 PSYCHIATRIC:  Normal affect    Signed, Denise Herrlich, MD  08/22/2022 3:25 PM    [facility]

## 2022-08-22 ENCOUNTER — Ambulatory Visit: Payer: PPO | Attending: Cardiology | Admitting: Cardiology

## 2022-08-22 ENCOUNTER — Encounter: Payer: Self-pay | Admitting: Cardiology

## 2022-08-22 VITALS — BP 120/60 | HR 66 | Ht 61.6 in | Wt 123.8 lb

## 2022-08-22 DIAGNOSIS — Q211 Atrial septal defect, unspecified: Secondary | ICD-10-CM

## 2022-08-22 DIAGNOSIS — Z952 Presence of prosthetic heart valve: Secondary | ICD-10-CM

## 2022-08-22 DIAGNOSIS — I110 Hypertensive heart disease with heart failure: Secondary | ICD-10-CM | POA: Diagnosis not present

## 2022-08-22 DIAGNOSIS — Z8774 Personal history of (corrected) congenital malformations of heart and circulatory system: Secondary | ICD-10-CM | POA: Diagnosis not present

## 2022-08-22 DIAGNOSIS — D508 Other iron deficiency anemias: Secondary | ICD-10-CM

## 2022-08-22 DIAGNOSIS — E7800 Pure hypercholesterolemia, unspecified: Secondary | ICD-10-CM

## 2022-08-22 NOTE — Patient Instructions (Signed)
Medication Instructions:  Your physician recommends that you continue on your current medications as directed. Please refer to the Current Medication list given to you today.  *If you need a refill on your cardiac medications before your next appointment, please call your pharmacy*   Lab Work: None If you have labs (blood work) drawn today and your tests are completely normal, you will receive your results only by: [place] (if you have MyChart) OR A paper copy in the mail If you have any lab test that is abnormal or we need to change your treatment, we will call you to review the results.   Testing/Procedures: Your physician has requested that you have an echocardiogram. Echocardiography is a painless test that uses sound waves to create images of your heart. It provides your doctor with information about the size and shape of your heart and how well your heart's chambers and valves are working. This procedure takes approximately one hour. There are no restrictions for this procedure. Please do NOT wear cologne, perfume, aftershave, or lotions (deodorant is allowed). Please arrive 15 minutes prior to your appointment time.    Follow-Up: At [facility], you and your health needs are our priority.  As part of our continuing mission to provide you with exceptional heart care, we have created designated Provider Care Teams.  These Care Teams include your primary Cardiologist (physician) and Advanced Practice Providers (APPs -  Physician Assistants and Nurse Practitioners) who all work together to provide you with the care you need, when you need it.  We recommend signing up for the patient portal called "MyChart".  Sign up information is provided on this After Visit Summary.  MyChart is used to connect with patients for Virtual Visits (Telemedicine).  Patients are able to view lab/test results, encounter notes, upcoming appointments, etc.  Non-urgent messages can be sent to your  provider as well.   To learn more about what you can do with MyChart, go to [email or web address].    Your next appointment:   1 year(s)  Provider:   Shirlee More, MD    Other Instructions None

## 2022-09-18 ENCOUNTER — Ambulatory Visit: Payer: PPO | Attending: Cardiology

## 2022-09-18 DIAGNOSIS — I110 Hypertensive heart disease with heart failure: Secondary | ICD-10-CM | POA: Diagnosis not present

## 2022-09-18 DIAGNOSIS — E7800 Pure hypercholesterolemia, unspecified: Secondary | ICD-10-CM

## 2022-09-18 DIAGNOSIS — Q211 Atrial septal defect, unspecified: Secondary | ICD-10-CM | POA: Diagnosis not present

## 2022-09-18 DIAGNOSIS — Z952 Presence of prosthetic heart valve: Secondary | ICD-10-CM | POA: Diagnosis not present

## 2022-09-18 DIAGNOSIS — D508 Other iron deficiency anemias: Secondary | ICD-10-CM

## 2022-09-18 DIAGNOSIS — Z8774 Personal history of (corrected) congenital malformations of heart and circulatory system: Secondary | ICD-10-CM

## 2022-09-19 LAB — ECHOCARDIOGRAM COMPLETE
AR max vel: 3.39 cm2
AV Area VTI: 3.31 cm2
AV Area mean vel: 3.33 cm2
AV Mean grad: 5.5 mmHg
AV Peak grad: 10 mmHg
Ao pk vel: 1.58 m/s
Area-P 1/2: 3.37 cm2
MV M vel: 4.96 m/s
MV Peak grad: 98.4 mmHg
Radius: 0.4 cm
S' Lateral: 2.8 cm

## 2022-10-18 ENCOUNTER — Telehealth: Payer: Self-pay | Admitting: Cardiology

## 2022-10-18 NOTE — Telephone Encounter (Signed)
Patient calling in regards letter she received in the mail. Please advise

## 2022-10-18 NOTE — Telephone Encounter (Signed)
Unable to reach pt or leave a message, phone just rings.

## 2022-10-21 NOTE — Telephone Encounter (Signed)
Unable to reach pt, phone just rings, no answer

## 2022-10-22 NOTE — Telephone Encounter (Signed)
Unable to reach pt or leave a message, phone just rings.

## 2023-03-26 DIAGNOSIS — D509 Iron deficiency anemia, unspecified: Secondary | ICD-10-CM | POA: Diagnosis not present

## 2023-03-26 DIAGNOSIS — Z6824 Body mass index (BMI) 24.0-24.9, adult: Secondary | ICD-10-CM | POA: Diagnosis not present

## 2023-03-26 DIAGNOSIS — J40 Bronchitis, not specified as acute or chronic: Secondary | ICD-10-CM | POA: Diagnosis not present

## 2023-03-26 DIAGNOSIS — I509 Heart failure, unspecified: Secondary | ICD-10-CM | POA: Diagnosis not present

## 2023-03-26 DIAGNOSIS — I872 Venous insufficiency (chronic) (peripheral): Secondary | ICD-10-CM | POA: Diagnosis not present

## 2023-03-26 DIAGNOSIS — E785 Hyperlipidemia, unspecified: Secondary | ICD-10-CM | POA: Diagnosis not present

## 2023-03-26 DIAGNOSIS — E063 Autoimmune thyroiditis: Secondary | ICD-10-CM | POA: Diagnosis not present

## 2023-04-16 DIAGNOSIS — N959 Unspecified menopausal and perimenopausal disorder: Secondary | ICD-10-CM | POA: Diagnosis not present

## 2023-04-17 DIAGNOSIS — Z13820 Encounter for screening for osteoporosis: Secondary | ICD-10-CM | POA: Diagnosis not present

## 2023-04-17 DIAGNOSIS — M85839 Other specified disorders of bone density and structure, unspecified forearm: Secondary | ICD-10-CM | POA: Diagnosis not present

## 2023-05-19 ENCOUNTER — Encounter (HOSPITAL_COMMUNITY): Payer: PPO

## 2023-06-05 ENCOUNTER — Encounter

## 2023-06-05 ENCOUNTER — Encounter (HOSPITAL_COMMUNITY)

## 2023-07-28 DIAGNOSIS — Z1331 Encounter for screening for depression: Secondary | ICD-10-CM | POA: Diagnosis not present

## 2023-07-28 DIAGNOSIS — E063 Autoimmune thyroiditis: Secondary | ICD-10-CM | POA: Diagnosis not present

## 2023-07-28 DIAGNOSIS — D509 Iron deficiency anemia, unspecified: Secondary | ICD-10-CM | POA: Diagnosis not present

## 2023-07-28 DIAGNOSIS — Z9181 History of falling: Secondary | ICD-10-CM | POA: Diagnosis not present

## 2023-07-28 DIAGNOSIS — E785 Hyperlipidemia, unspecified: Secondary | ICD-10-CM | POA: Diagnosis not present

## 2023-07-28 DIAGNOSIS — E559 Vitamin D deficiency, unspecified: Secondary | ICD-10-CM | POA: Diagnosis not present

## 2023-07-28 DIAGNOSIS — I509 Heart failure, unspecified: Secondary | ICD-10-CM | POA: Diagnosis not present

## 2023-07-28 DIAGNOSIS — I872 Venous insufficiency (chronic) (peripheral): Secondary | ICD-10-CM | POA: Diagnosis not present

## 2023-07-28 DIAGNOSIS — M545 Low back pain, unspecified: Secondary | ICD-10-CM | POA: Diagnosis not present

## 2023-10-12 NOTE — Progress Notes (Unsigned)
 Cardiology Office Note:    Date:  10/14/2023   ID:  Denise Bailey, DOB 05/19/1945, MRN [id number]  PCP:  Pandora Therisa RAMAN, NP  Cardiologist:  Redell Leiter, MD    Referring MD: Pandora Therisa RAMAN, NP    ASSESSMENT:    1. S/P TAVR (transcatheter aortic valve replacement)   2. Hypertensive heart disease with heart failure (HCC)   3. Status post device closure of ASD   4. Pure hypercholesterolemia   5. Other iron deficiency anemia    PLAN:    In order of problems listed above:  Denise Bailey continues to do well following TAVR asymptomatic [place]  Heart Association class I.  She will continue standard therapy including low-dose aspirin  restart her statin and we will schedule her echocardiogram for surveillance of her ASD closure device and TAVR. Poorly controlled lipids restart her statin show follow-up labs in her PCP office CBC hemoglobin is normal   Next appointment: 1 year follow-up   Medication Adjustments/Labs and Tests Ordered: Current medicines are reviewed at length with the patient today.  Concerns regarding medicines are outlined above.  Orders Placed This Encounter  Procedures   EKG 12-Lead   No orders of the defined types were placed in this encounter.    History of Present Illness:    Denise Bailey is a 78 y.o. female with a hx of severe symptomatic aortic stenosis with TAVR July 2021 and ASD repair with a 14 mm Amplatzer device hypertension hyperlipidemia and iron deficiency anemia last seen 08/22/2022.  Her most recent echocardiogram performed August 2024 her office visit showed normal left ventricular ejection fraction 60 to 65% normal TAVR function no valvular regurgitation.  Recent labs 07/28/2023 showing normal hemoglobin of 13.2 platelets mildly diminished 144,000 creatinine 0.93 with a GFR of 63 cc/min sodium 140 potassium 4.4 alkaline phosphatase mildly elevated normal AST ALT and lipid profile with a total cholesterol 231 LDL 147 HDL 66 with non-HDL  cholesterol of 165.  Compliance with diet, lifestyle and medications: No she stopped taking her statin  Overall she is doing well she had some mild GI symptoms last week Since TAVR no chest pain edema shortness of breath palpitation or syncope Recent labs show quite elevated lipid profile cholesterol 231 LDL 147 non-HDL cholesterol 165. Past Medical History:  Diagnosis Date   Acute on chronic diastolic heart failure (HCC) 08/11/2019   Arthritis    ASD (atrial septal defect)    Atrial septal defect    Bilateral leg edema 03/15/2019   Breast cancer (HCC)    Chronic diastolic CHF (congestive heart failure) (HCC) 10/28/2019   Complication of anesthesia    patient reportd having difficult time waking up from anesthesia   History of breast cancer    s/p mastectomy    Hypothyroidism    Leg edema 10/28/2019   S/P TAVR (transcatheter aortic valve replacement) 08/10/2019   s/p TAVR with a 26 mm Medtronic Evolut Pro + via the TF approach by Dr. Wonda and Dusty    Secundum ASD 10/28/2019   Severe aortic stenosis 03/15/2019   Status post device closure of ASD 01/28/2020   Venous insufficiency 04/12/2019    Current Medications: Current Meds  Medication Sig   amoxicillin  (AMOXIL ) 500 MG tablet Take 4 capsules (2,000 mg) one hour prior to all dental visits.   aspirin  EC 81 MG tablet Take 1 tablet (81 mg total) by mouth daily. Swallow whole.   fluticasone (FLONASE) 50 MCG/ACT nasal spray Place 1 spray into both  nostrils daily as needed for allergies or rhinitis.   levothyroxine  (SYNTHROID ) 75 MCG tablet Take 75 mcg by mouth daily.   Pediatric Multivitamins-Iron (FLINTSTONES PLUS IRON PO) Take 1 tablet by mouth daily.   pravastatin  (PRAVACHOL ) 20 MG tablet Take 1 tablet (20 mg total) by mouth every evening.   traMADol  (ULTRAM ) 50 MG tablet Take 50 mg by mouth 3 (three) times daily as needed for severe pain.       EKGs/Labs/Other Studies Reviewed:    The following studies were reviewed  today:     EKG Interpretation Date/Time:  Tuesday October 14 2023 11:14:29 EDT Ventricular Rate:  58 PR Interval:  116 QRS Duration:  78 QT Interval:  406 QTC Calculation: 398 R Axis:   61  Text Interpretation: Sinus bradycardia otherwise normal EKG When compared with ECG of 11-Aug-2019 06:33, Nonspecific T wave abnormality no longer evident in Inferior leads T wave inversion no longer evident in Anterolateral leads Confirmed by Monetta Rogue ([id number]) on 10/14/2023 11:29:39 AM   Recent Labs: 07/28/2023 cholesterol 231 LDL 147 hemoglobin 13.2 creatinine 0.93   Physical Exam:    VS:  BP (!) 116/50   Pulse (!) 58   Ht 5' 1 (1.549 m)   Wt 123 lb (55.8 kg)   SpO2 99%   BMI 23.24 kg/m     Wt Readings from Last 3 Encounters:  10/14/23 123 lb (55.8 kg)  08/22/22 123 lb 12.8 oz (56.2 kg)  08/22/21 123 lb (55.8 kg)     GEN:  Well nourished, well developed in no acute distress HEENT: Normal NECK: No JVD; No carotid bruits LYMPHATICS: No lymphadenopathy CARDIAC: RRR, no murmurs, rubs, gallops RESPIRATORY:  Clear to auscultation without rales, wheezing or rhonchi  ABDOMEN: Soft, non-tender, non-distended MUSCULOSKELETAL:  No edema; No deformity  SKIN: Warm and dry NEUROLOGIC:  Alert and oriented x 3 PSYCHIATRIC:  Normal affect    Signed, Rogue Monetta, MD  10/14/2023 11:40 AM    [facility]

## 2023-10-14 ENCOUNTER — Ambulatory Visit: Attending: Cardiology | Admitting: Cardiology

## 2023-10-14 ENCOUNTER — Encounter: Payer: Self-pay | Admitting: Cardiology

## 2023-10-14 VITALS — BP 116/50 | HR 58 | Ht 61.0 in | Wt 123.0 lb

## 2023-10-14 DIAGNOSIS — D508 Other iron deficiency anemias: Secondary | ICD-10-CM | POA: Diagnosis not present

## 2023-10-14 DIAGNOSIS — Z952 Presence of prosthetic heart valve: Secondary | ICD-10-CM

## 2023-10-14 DIAGNOSIS — Z8774 Personal history of (corrected) congenital malformations of heart and circulatory system: Secondary | ICD-10-CM

## 2023-10-14 DIAGNOSIS — E7800 Pure hypercholesterolemia, unspecified: Secondary | ICD-10-CM | POA: Diagnosis not present

## 2023-10-14 DIAGNOSIS — I110 Hypertensive heart disease with heart failure: Secondary | ICD-10-CM

## 2023-10-14 NOTE — Patient Instructions (Signed)
 Medication Instructions:  Your physician recommends that you continue on your current medications as directed. Please refer to the Current Medication list given to you today.  *If you need a refill on your cardiac medications before your next appointment, please call your pharmacy*  Lab Work: None If you have labs (blood work) drawn today and your tests are completely normal, you will receive your results only by: MyChart Message (if you have MyChart) OR A paper copy in the mail If you have any lab test that is abnormal or we need to change your treatment, we will call you to review the results.  Testing/Procedures: Your physician has requested that you have an echocardiogram. Echocardiography is a painless test that uses sound waves to create images of your heart. It provides your doctor with information about the size and shape of your heart and how well your heart's chambers and valves are working. This procedure takes approximately one hour. There are no restrictions for this procedure. Please do NOT wear cologne, perfume, aftershave, or lotions (deodorant is allowed). Please arrive 15 minutes prior to your appointment time.  Please note: We ask at that you not bring children with you during ultrasound (echo/ vascular) testing. Due to room size and safety concerns, children are not allowed in the ultrasound rooms during exams. Our front office staff cannot provide observation of children in our lobby area while testing is being conducted. An adult accompanying a patient to their appointment will only be allowed in the ultrasound room at the discretion of the ultrasound technician under special circumstances. We apologize for any inconvenience.   Follow-Up: At [facility], you and your health needs are our priority.  As part of our continuing mission to provide you with exceptional heart care, our providers are all part of one team.  This team includes your primary Cardiologist  (physician) and Advanced Practice Providers or APPs (Physician Assistants and Nurse Practitioners) who all work together to provide you with the care you need, when you need it.  Your next appointment:   1 year(s)  Provider:   Redell Leiter, MD    We recommend signing up for the patient portal called MyChart.  Sign up information is provided on this After Visit Summary.  MyChart is used to connect with patients for Virtual Visits (Telemedicine).  Patients are able to view lab/test results, encounter notes, upcoming appointments, etc.  Non-urgent messages can be sent to your provider as well.   To learn more about what you can do with MyChart, go to [email or web address].   Other Instructions Take your Pravastatin  medication daily

## 2023-10-28 DIAGNOSIS — Z23 Encounter for immunization: Secondary | ICD-10-CM | POA: Diagnosis not present

## 2023-11-05 DIAGNOSIS — M810 Age-related osteoporosis without current pathological fracture: Secondary | ICD-10-CM | POA: Diagnosis not present

## 2023-11-05 DIAGNOSIS — E785 Hyperlipidemia, unspecified: Secondary | ICD-10-CM | POA: Diagnosis not present

## 2023-11-05 DIAGNOSIS — M545 Low back pain, unspecified: Secondary | ICD-10-CM | POA: Diagnosis not present

## 2023-11-05 DIAGNOSIS — I509 Heart failure, unspecified: Secondary | ICD-10-CM | POA: Diagnosis not present

## 2023-11-05 DIAGNOSIS — D509 Iron deficiency anemia, unspecified: Secondary | ICD-10-CM | POA: Diagnosis not present

## 2023-11-05 DIAGNOSIS — E063 Autoimmune thyroiditis: Secondary | ICD-10-CM | POA: Diagnosis not present

## 2023-11-06 ENCOUNTER — Ambulatory Visit: Attending: Cardiology

## 2023-11-06 DIAGNOSIS — Z8774 Personal history of (corrected) congenital malformations of heart and circulatory system: Secondary | ICD-10-CM

## 2023-11-06 DIAGNOSIS — Z952 Presence of prosthetic heart valve: Secondary | ICD-10-CM | POA: Diagnosis not present

## 2023-11-06 DIAGNOSIS — I110 Hypertensive heart disease with heart failure: Secondary | ICD-10-CM | POA: Diagnosis not present

## 2023-11-06 DIAGNOSIS — E7800 Pure hypercholesterolemia, unspecified: Secondary | ICD-10-CM | POA: Diagnosis not present

## 2023-11-06 DIAGNOSIS — D508 Other iron deficiency anemias: Secondary | ICD-10-CM | POA: Diagnosis not present

## 2023-11-06 LAB — ECHOCARDIOGRAM COMPLETE
AR max vel: 3.08 cm2
AV Area VTI: 2.87 cm2
AV Area mean vel: 3.04 cm2
AV Mean grad: 6 mmHg
AV Peak grad: 11 mmHg
Ao pk vel: 1.66 m/s
Area-P 1/2: 3.68 cm2
MV M vel: 4.64 m/s
MV Peak grad: 86.1 mmHg
Radius: 0.6 cm
S' Lateral: 2.8 cm

## 2023-11-07 ENCOUNTER — Ambulatory Visit: Payer: Self-pay

## 2023-11-19 DIAGNOSIS — N289 Disorder of kidney and ureter, unspecified: Secondary | ICD-10-CM | POA: Diagnosis not present

## 2023-11-24 NOTE — Telephone Encounter (Signed)
 Pt called for results. She asked that you call after 12pm if possible

## 2023-12-05 NOTE — Progress Notes (Signed)
Attempted to call the patient. Patient did not answer the phone and no voice mail so unable to leave a message .
